# Patient Record
Sex: Male | Born: 2000 | Race: Black or African American | Hispanic: No | Marital: Single | Smoking: Never smoker
Health system: Southern US, Community
[De-identification: ages and names within clinical notes are randomized; demographics above are authoritative.]

## PROBLEM LIST (undated history)

## (undated) DIAGNOSIS — E109 Type 1 diabetes mellitus without complications: Secondary | ICD-10-CM

## (undated) DIAGNOSIS — E119 Type 2 diabetes mellitus without complications: Secondary | ICD-10-CM

## (undated) DIAGNOSIS — J45909 Unspecified asthma, uncomplicated: Secondary | ICD-10-CM

---
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
## 2021-02-03 ENCOUNTER — Emergency Department (HOSPITAL_COMMUNITY)
Admission: EM | Admit: 2021-02-03 | Discharge: 2021-02-04 | Disposition: A | Discharge status: Home / Self Care | Payer: Medicaid Other | Attending: Emergency Medicine | Admitting: Emergency Medicine

## 2021-02-03 ENCOUNTER — Other Ambulatory Visit: Payer: Self-pay

## 2021-02-03 ENCOUNTER — Emergency Department (HOSPITAL_COMMUNITY): Payer: Medicaid Other

## 2021-02-03 ENCOUNTER — Encounter (HOSPITAL_COMMUNITY): Payer: Self-pay | Admitting: *Deleted

## 2021-02-03 DIAGNOSIS — R519 Headache, unspecified: Secondary | ICD-10-CM | POA: Diagnosis not present

## 2021-02-03 DIAGNOSIS — R079 Chest pain, unspecified: Secondary | ICD-10-CM | POA: Insufficient documentation

## 2021-02-03 DIAGNOSIS — R0602 Shortness of breath: Secondary | ICD-10-CM | POA: Insufficient documentation

## 2021-02-03 DIAGNOSIS — R109 Unspecified abdominal pain: Secondary | ICD-10-CM | POA: Insufficient documentation

## 2021-02-03 DIAGNOSIS — Z5321 Procedure and treatment not carried out due to patient leaving prior to being seen by health care provider: Secondary | ICD-10-CM | POA: Diagnosis not present

## 2021-02-03 LAB — CBC WITH DIFFERENTIAL/PLATELET
Abs Immature Granulocytes: 0.03 10*3/uL (ref 0.00–0.07)
Basophils Absolute: 0 10*3/uL (ref 0.0–0.1)
Basophils Relative: 0 %
Eosinophils Absolute: 0.3 10*3/uL (ref 0.0–0.5)
Eosinophils Relative: 4 %
HCT: 46.5 % (ref 39.0–52.0)
Hemoglobin: 16 g/dL (ref 13.0–17.0)
Immature Granulocytes: 0 %
Lymphocytes Relative: 26 %
Lymphs Abs: 2.2 10*3/uL (ref 0.7–4.0)
MCH: 28.2 pg (ref 26.0–34.0)
MCHC: 34.4 g/dL (ref 30.0–36.0)
MCV: 81.9 fL (ref 80.0–100.0)
Monocytes Absolute: 0.5 10*3/uL (ref 0.1–1.0)
Monocytes Relative: 6 %
Neutro Abs: 5.1 10*3/uL (ref 1.7–7.7)
Neutrophils Relative %: 64 %
Platelets: 236 10*3/uL (ref 150–400)
RBC: 5.68 MIL/uL (ref 4.22–5.81)
RDW: 11.9 % (ref 11.5–15.5)
WBC: 8.2 10*3/uL (ref 4.0–10.5)
nRBC: 0 % (ref 0.0–0.2)

## 2021-02-03 LAB — URINALYSIS, ROUTINE W REFLEX MICROSCOPIC
Bilirubin Urine: NEGATIVE
Glucose, UA: NEGATIVE mg/dL
Hgb urine dipstick: NEGATIVE
Ketones, ur: NEGATIVE mg/dL
Leukocytes,Ua: NEGATIVE
Nitrite: NEGATIVE
Protein, ur: NEGATIVE mg/dL
Specific Gravity, Urine: 1.018 (ref 1.005–1.030)
pH: 6 (ref 5.0–8.0)

## 2021-02-03 LAB — COMPREHENSIVE METABOLIC PANEL
ALT: 41 U/L (ref 0–44)
AST: 26 U/L (ref 15–41)
Albumin: 3.7 g/dL (ref 3.5–5.0)
Alkaline Phosphatase: 51 U/L (ref 38–126)
Anion gap: 9 (ref 5–15)
BUN: <5 mg/dL — ABNORMAL LOW (ref 6–20)
CO2: 29 mmol/L (ref 22–32)
Calcium: 9.6 mg/dL (ref 8.9–10.3)
Chloride: 99 mmol/L (ref 98–111)
Creatinine, Ser: 1.19 mg/dL (ref 0.61–1.24)
GFR, Estimated: >60 mL/min (ref >60)
Glucose, Bld: 212 mg/dL — ABNORMAL HIGH (ref 70–99)
Potassium: 4 mmol/L (ref 3.5–5.1)
Sodium: 137 mmol/L (ref 135–145)
Total Bilirubin: 0.7 mg/dL (ref 0.3–1.2)
Total Protein: 7 g/dL (ref 6.5–8.1)

## 2021-02-03 LAB — CBG MONITORING, ED: Glucose-Capillary: 205 mg/dL — ABNORMAL HIGH (ref 70–99)

## 2021-02-03 LAB — TROPONIN I (HIGH SENSITIVITY)
Troponin I (High Sensitivity): 6 ng/L (ref <18)
Troponin I (High Sensitivity): 6 ng/L (ref <18)

## 2021-02-03 LAB — LIPASE, BLOOD: Lipase: 34 U/L (ref 11–51)

## 2021-02-03 NOTE — ED Provider Notes (Signed)
Emergency Medicine Provider Triage Evaluation Note  Bruce Walters , a 20 y.o. male  was evaluated in triage.  Pt complains of chest pain and hyperglycemia. He states that he is only taking metformin which is his only prescribed medicine for this.  He states that he has been having intermittent episodes where sugars are in the 60s.  He states that he overall has poor appetite.  He denies any fevers.  He does report pain in his chest which started about a week ago.  No fevers..  Review of Systems  Positive: Chest pain, abdominal pain.  Negative: fevers  Physical Exam  BP 132/79 (BP Location: Right Arm)   Pulse 97   Temp 98.7 F (37.1 C) (Oral)   Resp 16   Ht 6\' 5"  (1.956 m)   Wt (!) 138.3 kg   SpO2 97%   BMI 36.16 kg/m  Gen:   Awake, no distress   Resp:  Normal effort  MSK:   Moves extremities without difficulty  Other:  Patient is awake and alert.  Speech is not slurred.  Answers questions appropriately without difficulty.  Medical Decision Making  Medically screening exam initiated at 6:00 PM.  Appropriate orders placed.  Jaicob Dia was informed that the remainder of the evaluation will be completed by another provider, this initial triage assessment does not replace that evaluation, and the importance of remaining in the ED until their evaluation is complete.     Irving Copas, PA-C 02/03/21 1802    02/05/21, MD 02/03/21 [phone]

## 2021-02-03 NOTE — ED Notes (Signed)
Pt did not want to wait and left. 

## 2021-02-03 NOTE — ED Triage Notes (Signed)
The pt was diagnosed with diabetes one month ago  he does not have a regular doctor.  He is having chest and abd pain with some sob with a headache

## 2021-02-05 ENCOUNTER — Emergency Department (HOSPITAL_COMMUNITY): Payer: Medicaid Other

## 2021-02-05 ENCOUNTER — Emergency Department (HOSPITAL_COMMUNITY)
Admission: EM | Admit: 2021-02-05 | Discharge: 2021-02-06 | Disposition: A | Discharge status: Home / Self Care | Payer: Medicaid Other | Attending: Emergency Medicine | Admitting: Emergency Medicine

## 2021-02-05 ENCOUNTER — Other Ambulatory Visit: Payer: Self-pay

## 2021-02-05 ENCOUNTER — Encounter (HOSPITAL_COMMUNITY): Payer: Self-pay | Admitting: Emergency Medicine

## 2021-02-05 DIAGNOSIS — R079 Chest pain, unspecified: Secondary | ICD-10-CM | POA: Insufficient documentation

## 2021-02-05 DIAGNOSIS — R0602 Shortness of breath: Secondary | ICD-10-CM | POA: Insufficient documentation

## 2021-02-05 DIAGNOSIS — R112 Nausea with vomiting, unspecified: Secondary | ICD-10-CM | POA: Insufficient documentation

## 2021-02-05 DIAGNOSIS — Z5321 Procedure and treatment not carried out due to patient leaving prior to being seen by health care provider: Secondary | ICD-10-CM | POA: Insufficient documentation

## 2021-02-05 HISTORY — DX: Unspecified asthma, uncomplicated: J45.909

## 2021-02-05 LAB — COMPREHENSIVE METABOLIC PANEL
ALT: 39 U/L (ref 0–44)
AST: 26 U/L (ref 15–41)
Albumin: 3.9 g/dL (ref 3.5–5.0)
Alkaline Phosphatase: 50 U/L (ref 38–126)
Anion gap: 11 (ref 5–15)
BUN: 5 mg/dL — ABNORMAL LOW (ref 6–20)
CO2: 26 mmol/L (ref 22–32)
Calcium: 9.6 mg/dL (ref 8.9–10.3)
Chloride: 99 mmol/L (ref 98–111)
Creatinine, Ser: 1.17 mg/dL (ref 0.61–1.24)
GFR, Estimated: >60 mL/min (ref >60)
Glucose, Bld: 230 mg/dL — ABNORMAL HIGH (ref 70–99)
Potassium: 3.9 mmol/L (ref 3.5–5.1)
Sodium: 136 mmol/L (ref 135–145)
Total Bilirubin: 0.7 mg/dL (ref 0.3–1.2)
Total Protein: 7.2 g/dL (ref 6.5–8.1)

## 2021-02-05 LAB — CBC
HCT: 46.6 % (ref 39.0–52.0)
Hemoglobin: 16 g/dL (ref 13.0–17.0)
MCH: 27.9 pg (ref 26.0–34.0)
MCHC: 34.3 g/dL (ref 30.0–36.0)
MCV: 81.3 fL (ref 80.0–100.0)
Platelets: 222 10*3/uL (ref 150–400)
RBC: 5.73 MIL/uL (ref 4.22–5.81)
RDW: 12.1 % (ref 11.5–15.5)
WBC: 8.1 10*3/uL (ref 4.0–10.5)
nRBC: 0 % (ref 0.0–0.2)

## 2021-02-05 LAB — TROPONIN I (HIGH SENSITIVITY): Troponin I (High Sensitivity): 5 ng/L (ref <18)

## 2021-02-05 LAB — LIPASE, BLOOD: Lipase: 36 U/L (ref 11–51)

## 2021-02-05 NOTE — ED Triage Notes (Signed)
Pt here for cp and sob that started about a week ago. Centralized cp, non radiating, along w/ N/V, sob.

## 2021-02-06 NOTE — ED Notes (Signed)
Pt leaving AMA, advised to return if symptoms worsen. 

## 2021-02-07 ENCOUNTER — Emergency Department (HOSPITAL_COMMUNITY): Payer: Medicaid Other

## 2021-02-07 ENCOUNTER — Inpatient Hospital Stay (HOSPITAL_COMMUNITY)
Admission: EM | Admit: 2021-02-07 | Discharge: 2021-02-10 | DRG: 176 | Disposition: A | Discharge status: Home / Self Care | Payer: Medicaid Other | Attending: Hospitalist | Admitting: Hospitalist

## 2021-02-07 ENCOUNTER — Other Ambulatory Visit: Payer: Self-pay

## 2021-02-07 DIAGNOSIS — I2694 Multiple subsegmental pulmonary emboli without acute cor pulmonale: Secondary | ICD-10-CM

## 2021-02-07 DIAGNOSIS — Z20822 Contact with and (suspected) exposure to covid-19: Secondary | ICD-10-CM | POA: Diagnosis present

## 2021-02-07 DIAGNOSIS — E119 Type 2 diabetes mellitus without complications: Secondary | ICD-10-CM

## 2021-02-07 DIAGNOSIS — Z7984 Long term (current) use of oral hypoglycemic drugs: Secondary | ICD-10-CM

## 2021-02-07 DIAGNOSIS — E1165 Type 2 diabetes mellitus with hyperglycemia: Secondary | ICD-10-CM | POA: Diagnosis present

## 2021-02-07 DIAGNOSIS — J4530 Mild persistent asthma, uncomplicated: Secondary | ICD-10-CM | POA: Diagnosis present

## 2021-02-07 DIAGNOSIS — R911 Solitary pulmonary nodule: Secondary | ICD-10-CM | POA: Diagnosis present

## 2021-02-07 DIAGNOSIS — E66812 Obesity, class 2: Secondary | ICD-10-CM

## 2021-02-07 DIAGNOSIS — Z6836 Body mass index (BMI) 36.0-36.9, adult: Secondary | ICD-10-CM

## 2021-02-07 DIAGNOSIS — I2699 Other pulmonary embolism without acute cor pulmonale: Principal | ICD-10-CM | POA: Diagnosis present

## 2021-02-07 HISTORY — DX: Body mass index (BMI) 36.0-36.9, adult: E66.01

## 2021-02-07 LAB — BASIC METABOLIC PANEL
Anion gap: 11 (ref 5–15)
BUN: 6 mg/dL (ref 6–20)
CO2: 24 mmol/L (ref 22–32)
Calcium: 9.5 mg/dL (ref 8.9–10.3)
Chloride: 100 mmol/L (ref 98–111)
Creatinine, Ser: 1.28 mg/dL — ABNORMAL HIGH (ref 0.61–1.24)
GFR, Estimated: >60 mL/min (ref >60)
Glucose, Bld: 243 mg/dL — ABNORMAL HIGH (ref 70–99)
Potassium: 3.8 mmol/L (ref 3.5–5.1)
Sodium: 135 mmol/L (ref 135–145)

## 2021-02-07 LAB — CBC WITH DIFFERENTIAL/PLATELET
Abs Immature Granulocytes: 0.03 10*3/uL (ref 0.00–0.07)
Basophils Absolute: 0.1 10*3/uL (ref 0.0–0.1)
Basophils Relative: 1 %
Eosinophils Absolute: 0.4 10*3/uL (ref 0.0–0.5)
Eosinophils Relative: 5 %
HCT: 47.7 % (ref 39.0–52.0)
Hemoglobin: 16.1 g/dL (ref 13.0–17.0)
Immature Granulocytes: 0 %
Lymphocytes Relative: 25 %
Lymphs Abs: 2.3 10*3/uL (ref 0.7–4.0)
MCH: 27.8 pg (ref 26.0–34.0)
MCHC: 33.8 g/dL (ref 30.0–36.0)
MCV: 82.2 fL (ref 80.0–100.0)
Monocytes Absolute: 0.6 10*3/uL (ref 0.1–1.0)
Monocytes Relative: 6 %
Neutro Abs: 5.8 10*3/uL (ref 1.7–7.7)
Neutrophils Relative %: 63 %
Platelets: 200 10*3/uL (ref 150–400)
RBC: 5.8 MIL/uL (ref 4.22–5.81)
RDW: 12 % (ref 11.5–15.5)
WBC: 9.1 10*3/uL (ref 4.0–10.5)
nRBC: 0 % (ref 0.0–0.2)

## 2021-02-07 LAB — TROPONIN I (HIGH SENSITIVITY): Troponin I (High Sensitivity): 32 ng/L — ABNORMAL HIGH (ref <18)

## 2021-02-07 LAB — MAGNESIUM: Magnesium: 1.7 mg/dL (ref 1.7–2.4)

## 2021-02-07 NOTE — ED Triage Notes (Signed)
Pt reports cp and shob x 1 week. Worse with exertion. Also reports abdominal pain with n/v but no diarrhea. Seen for same two days ago but LWBS d/t wait.

## 2021-02-07 NOTE — ED Provider Notes (Signed)
Emergency Medicine Provider Triage Evaluation Note  Bruce Walters , a 20 y.o. male  was evaluated in triage.  Pt complains of chest pain with associated shortness of breath and palpitations x1 week.  Happens at rest but also exacerbated with exertion.  Does not radiate to the neck or the arms.  No history of the same.  Recently diagnosed with diabetes but does not have a primary care doctor.  He is on glipizide and metformin.  Endorses nausea, vomiting with single episode of NBNB emesis daily for the last week, no diarrhea no fevers no chills.  Review of Systems  Positive: CP, SOB, palpitations, N/V Negative: Fevers, chills, diarrhea  Physical Exam  BP 136/81 (BP Location: Right Arm)   Pulse (!) 112   Temp 98.2 F (36.8 C) (Oral)   Resp 18   SpO2 97%  Gen:   Awake, no distress   Resp:  Normal effort  MSK:   Moves extremities without difficulty  Other:  Tachycardic with RRhythm no M/R/G.  Lungs CTA B.  Medical Decision Making  Medically screening exam initiated at 7:11 PM.  Appropriate orders placed.  Bruce Walters was informed that the remainder of the evaluation will be completed by another provider, this initial triage assessment does not replace that evaluation, and the importance of remaining in the ED until their evaluation is complete.  This chart was dictated using voice recognition software, Dragon. Despite the best efforts of this provider to proofread and correct errors, errors may still occur which can change documentation meaning.    Sherrilee Gilles 02/07/21 1913    Pollyann Savoy, MD 02/07/21 [phone]

## 2021-02-08 ENCOUNTER — Observation Stay (HOSPITAL_BASED_OUTPATIENT_CLINIC_OR_DEPARTMENT_OTHER): Payer: Medicaid Other

## 2021-02-08 ENCOUNTER — Emergency Department (HOSPITAL_COMMUNITY): Payer: Medicaid Other

## 2021-02-08 ENCOUNTER — Encounter: Payer: Self-pay | Admitting: Internal Medicine

## 2021-02-08 ENCOUNTER — Encounter (HOSPITAL_COMMUNITY): Payer: Self-pay | Admitting: Internal Medicine

## 2021-02-08 DIAGNOSIS — I2699 Other pulmonary embolism without acute cor pulmonale: Secondary | ICD-10-CM | POA: Diagnosis not present

## 2021-02-08 DIAGNOSIS — I2602 Saddle embolus of pulmonary artery with acute cor pulmonale: Secondary | ICD-10-CM

## 2021-02-08 DIAGNOSIS — E119 Type 2 diabetes mellitus without complications: Secondary | ICD-10-CM | POA: Insufficient documentation

## 2021-02-08 LAB — HEPARIN LEVEL (UNFRACTIONATED)
Heparin Unfractionated: 0.27 IU/mL — ABNORMAL LOW (ref 0.30–0.70)
Heparin Unfractionated: 0.36 IU/mL (ref 0.30–0.70)

## 2021-02-08 LAB — CBC
HCT: 45 % (ref 39.0–52.0)
Hemoglobin: 15.3 g/dL (ref 13.0–17.0)
MCH: 27.9 pg (ref 26.0–34.0)
MCHC: 34 g/dL (ref 30.0–36.0)
MCV: 82.1 fL (ref 80.0–100.0)
Platelets: 185 10*3/uL (ref 150–400)
RBC: 5.48 MIL/uL (ref 4.22–5.81)
RDW: 12.1 % (ref 11.5–15.5)
WBC: 11.2 10*3/uL — ABNORMAL HIGH (ref 4.0–10.5)
nRBC: 0 % (ref 0.0–0.2)

## 2021-02-08 LAB — ECHOCARDIOGRAM COMPLETE
Area-P 1/2: 3.88 cm2
S' Lateral: 3 cm

## 2021-02-08 LAB — RESP PANEL BY RT-PCR (FLU A&B, COVID) ARPGX2
Influenza A by PCR: NEGATIVE
Influenza B by PCR: NEGATIVE
SARS Coronavirus 2 by RT PCR: NEGATIVE

## 2021-02-08 LAB — GLUCOSE, CAPILLARY
Glucose-Capillary: 193 mg/dL — ABNORMAL HIGH (ref 70–99)
Glucose-Capillary: 133 mg/dL — ABNORMAL HIGH (ref 70–99)
Glucose-Capillary: 138 mg/dL — ABNORMAL HIGH (ref 70–99)

## 2021-02-08 LAB — RAPID URINE DRUG SCREEN, HOSP PERFORMED
Amphetamines: NOT DETECTED
Barbiturates: NOT DETECTED
Benzodiazepines: NOT DETECTED
Cocaine: NOT DETECTED
Opiates: NOT DETECTED
Tetrahydrocannabinol: NOT DETECTED

## 2021-02-08 LAB — BRAIN NATRIURETIC PEPTIDE: B Natriuretic Peptide: 18 pg/mL (ref 0.0–100.0)

## 2021-02-08 LAB — BASIC METABOLIC PANEL
Anion gap: 9 (ref 5–15)
BUN: 5 mg/dL — ABNORMAL LOW (ref 6–20)
CO2: 27 mmol/L (ref 22–32)
Calcium: 9.2 mg/dL (ref 8.9–10.3)
Chloride: 101 mmol/L (ref 98–111)
Creatinine, Ser: 1.17 mg/dL (ref 0.61–1.24)
GFR, Estimated: >60 mL/min (ref >60)
Glucose, Bld: 150 mg/dL — ABNORMAL HIGH (ref 70–99)
Potassium: 4 mmol/L (ref 3.5–5.1)
Sodium: 137 mmol/L (ref 135–145)

## 2021-02-08 LAB — TROPONIN I (HIGH SENSITIVITY): Troponin I (High Sensitivity): 30 ng/L — ABNORMAL HIGH (ref <18)

## 2021-02-08 LAB — HEMOGLOBIN A1C
Hgb A1c MFr Bld: 10.6 % — ABNORMAL HIGH (ref 4.8–5.6)
Mean Plasma Glucose: 257.52 mg/dL

## 2021-02-08 LAB — HIV ANTIBODY (ROUTINE TESTING W REFLEX): HIV Screen 4th Generation wRfx: NONREACTIVE

## 2021-02-08 LAB — PHOSPHORUS: Phosphorus: 4.1 mg/dL (ref 2.5–4.6)

## 2021-02-08 LAB — MAGNESIUM: Magnesium: 1.6 mg/dL — ABNORMAL LOW (ref 1.7–2.4)

## 2021-02-08 LAB — CBG MONITORING, ED: Glucose-Capillary: 144 mg/dL — ABNORMAL HIGH (ref 70–99)

## 2021-02-08 MED ORDER — ONDANSETRON HCL 4 MG PO TABS: 4.0000 mg | ORAL_TABLET | Freq: Four times a day (QID) | ORAL | Status: DC | PRN

## 2021-02-08 MED ORDER — INSULIN ASPART 100 UNIT/ML IJ SOLN: 0.0000 [IU] | Freq: Every day | INTRAMUSCULAR | Status: DC

## 2021-02-08 MED ORDER — ACETAMINOPHEN 325 MG PO TABS: 650.0000 mg | ORAL_TABLET | Freq: Four times a day (QID) | ORAL | Status: DC | PRN

## 2021-02-08 MED ORDER — HEPARIN (PORCINE) 25000 UT/250ML-% IV SOLN
2300.0000 [IU]/h | INTRAVENOUS | Status: DC
  Administered 2021-02-08: 1800 [IU]/h via INTRAVENOUS
  Administered 2021-02-09: 2000 [IU]/h via INTRAVENOUS
  Filled 2021-02-08 (×3): qty 250

## 2021-02-08 MED ORDER — ALBUTEROL SULFATE HFA 108 (90 BASE) MCG/ACT IN AERS
2.0000 | INHALATION_SPRAY | Freq: Once | RESPIRATORY_TRACT | Status: AC
  Administered 2021-02-08: 2 via RESPIRATORY_TRACT
  Filled 2021-02-08: qty 6.7

## 2021-02-08 MED ORDER — ONDANSETRON 4 MG PO TBDP
4.0000 mg | ORAL_TABLET | Freq: Once | ORAL | Status: AC
  Administered 2021-02-08: 4 mg via ORAL
  Filled 2021-02-08: qty 1

## 2021-02-08 MED ORDER — BISACODYL 5 MG PO TBEC: 5.0000 mg | DELAYED_RELEASE_TABLET | Freq: Every day | ORAL | Status: DC | PRN

## 2021-02-08 MED ORDER — ONDANSETRON HCL 4 MG/2ML IJ SOLN: 4.0000 mg | Freq: Four times a day (QID) | INTRAMUSCULAR | Status: DC | PRN

## 2021-02-08 MED ORDER — OXYCODONE HCL 5 MG PO TABS: 5.0000 mg | ORAL_TABLET | Freq: Four times a day (QID) | ORAL | Status: DC | PRN

## 2021-02-08 MED ORDER — HYDROCODONE-ACETAMINOPHEN 5-325 MG PO TABS
1.0000 | ORAL_TABLET | ORAL | Status: DC | PRN
  Administered 2021-02-09: 2 via ORAL
  Filled 2021-02-08: qty 2

## 2021-02-08 MED ORDER — MAGNESIUM OXIDE -MG SUPPLEMENT 400 (240 MG) MG PO TABS
800.0000 mg | ORAL_TABLET | Freq: Once | ORAL | Status: AC
  Administered 2021-02-08: 800 mg via ORAL
  Filled 2021-02-08: qty 2

## 2021-02-08 MED ORDER — HEPARIN BOLUS VIA INFUSION
7000.0000 [IU] | Freq: Once | INTRAVENOUS | Status: AC
  Administered 2021-02-08: 7000 [IU] via INTRAVENOUS
  Filled 2021-02-08: qty 7000

## 2021-02-08 MED ORDER — ACETAMINOPHEN 325 MG PO TABS
650.0000 mg | ORAL_TABLET | Freq: Four times a day (QID) | ORAL | Status: DC | PRN
  Administered 2021-02-09: 650 mg via ORAL
  Filled 2021-02-08: qty 2

## 2021-02-08 MED ORDER — INSULIN ASPART 100 UNIT/ML IJ SOLN
0.0000 [IU] | Freq: Three times a day (TID) | INTRAMUSCULAR | Status: DC
  Administered 2021-02-08: 3 [IU] via SUBCUTANEOUS
  Administered 2021-02-08: 2 [IU] via SUBCUTANEOUS
  Administered 2021-02-09: 3 [IU] via SUBCUTANEOUS
  Administered 2021-02-09 (×2): 2 [IU] via SUBCUTANEOUS
  Administered 2021-02-10 (×2): 3 [IU] via SUBCUTANEOUS

## 2021-02-08 MED ORDER — LACTATED RINGERS IV SOLN: INTRAVENOUS | Status: DC

## 2021-02-08 MED ORDER — INSULIN ASPART 100 UNIT/ML IJ SOLN
0.0000 [IU] | Freq: Three times a day (TID) | INTRAMUSCULAR | Status: DC
  Administered 2021-02-08: 3 [IU] via SUBCUTANEOUS

## 2021-02-08 MED ORDER — IOHEXOL 350 MG/ML SOLN
75.0000 mL | Freq: Once | INTRAVENOUS | Status: AC | PRN
  Administered 2021-02-08: 75 mL via INTRAVENOUS

## 2021-02-08 MED ORDER — SODIUM CHLORIDE 0.9% FLUSH
3.0000 mL | Freq: Two times a day (BID) | INTRAVENOUS | Status: DC
  Administered 2021-02-08 – 2021-02-10 (×5): 3 mL via INTRAVENOUS

## 2021-02-08 MED ORDER — LIVING WELL WITH DIABETES BOOK
Freq: Once | Status: AC
  Filled 2021-02-08: qty 1

## 2021-02-08 MED ORDER — ACETAMINOPHEN 650 MG RE SUPP: 650.0000 mg | Freq: Four times a day (QID) | RECTAL | Status: DC | PRN

## 2021-02-08 MED ORDER — POLYETHYLENE GLYCOL 3350 17 G PO PACK: 17.0000 g | PACK | Freq: Every day | ORAL | Status: DC | PRN

## 2021-02-08 MED ORDER — MORPHINE SULFATE (PF) 2 MG/ML IV SOLN: 2.0000 mg | INTRAVENOUS | Status: DC | PRN

## 2021-02-08 NOTE — Progress Notes (Addendum)
Inpatient Diabetes Program Recommendations  AACE/ADA: New Consensus Statement on Inpatient Glycemic Control (2015)  Target Ranges:  Prepandial:   less than 140 mg/dL      Peak postprandial:   less than 180 mg/dL (1-2 hours)      Critically ill patients:  140 - 180 mg/dL   Lab Results  Component Value Date   GLUCAP 193 (H) 02/08/2021   HGBA1C 10.6 (H) 02/08/2021    Review of Glycemic Control  Diabetes history: New diagnosis in 09/2020 DM type 2 Outpatient Diabetes medications: Metformin 1000 mg bid, Glipizide 5 mg bid Current orders for Inpatient glycemic control:  Novolog 0-15 units tid + hs  Inpatient Diabetes Program Recommendations:    - Consider adding Glipizide 5 mg bid to see how it controls glucose levels at this time. May can avoid insulin based on current trends.  Consult regarding DM education/self care and fairly new diagnosis this year.  Spoke with pt and mom at bedside regarding new diabetes diagnosis in February. Walked through self care topics of checking glucose (fasting and alternating second check), taking meds (twice a day with food), exercise (30 minutes 5 days a week), Dietary changes (portion sizes, limiting carbohydrates, how often to eat certain foods), and A1c level. Pt and mom have no further questions at this time.   Note pt would sometimes eat one meal a day and would have occasional hypoglycemia at 60 at home on oral meds. SW  Thanks,  Christena Deem RN, MSN, BC-ADM Inpatient Diabetes Coordinator Team Pager [phone] (8a-5p)

## 2021-02-08 NOTE — ED Provider Notes (Signed)
[name] EMERGENCY DEPARTMENT Provider Note   CSN: [id number] Arrival date & time: 02/07/21  1801     History Chief Complaint  Patient presents with   Chest Pain   Abdominal Pain   Shortness of Breath    Bruce Walters is a 20 y.o. male.  Patient with history of DM presents with c/o chest pain, SOB, cough for the past one week. No fever. Symptoms are constant but made worse when active. He has had nausea with vomiting, reporting 1-2 episodes vomiting per day since symptoms began. He reports taking his medications as prescribed and controlled blood sugars. No sick contacts. No diarrhea. He reports driving to [place] within the last 2 weeks. No leg swelling or pain at any time.    The history is provided by the patient. No language interpreter was used.  Chest Pain Associated symptoms: abdominal pain, cough, shortness of breath and vomiting   Associated symptoms: no fever and no weakness   Abdominal Pain Associated symptoms: chest pain, cough, shortness of breath and vomiting   Associated symptoms: no chills, no diarrhea and no fever   Shortness of Breath Associated symptoms: abdominal pain, chest pain, cough and vomiting   Associated symptoms: no fever and no rash       Past Medical History:  Diagnosis Date   Asthma    Diabetes mellitus without complication (HCC)     There are no problems to display for this patient.   No past surgical history on file.     No family history on file.  Social History   Tobacco Use   Smoking status: Never   Smokeless tobacco: Never  Substance Use Topics   Alcohol use: Never   Drug use: Never    Home Medications Prior to Admission medications   Medication Sig Start Date End Date Taking? Authorizing Provider  glipiZIDE (GLUCOTROL) 5 MG tablet Take 1 tablet (5 mg total) by mouth 2 (two) times daily before a meal. 09/29/20   Wallis Bamberg, PA-C  metFORMIN (GLUCOPHAGE) 1000 MG tablet Take 1 tablet (1,000 mg total)  by mouth 2 (two) times daily with a meal. 09/29/20   Wallis Bamberg, PA-C  ondansetron (ZOFRAN-ODT) 8 MG disintegrating tablet Take 1 tablet (8 mg total) by mouth every 8 (eight) hours as needed for nausea or vomiting. 09/29/20   Wallis Bamberg, PA-C    Allergies    Patient has no known allergies.  Review of Systems   Review of Systems  Constitutional:  Negative for chills and fever.  HENT: Negative.    Respiratory:  Positive for cough and shortness of breath.   Cardiovascular:  Positive for chest pain.  Gastrointestinal:  Positive for abdominal pain and vomiting. Negative for diarrhea.  Genitourinary: Negative.   Musculoskeletal:  Negative for myalgias.  Skin:  Negative for rash.  Neurological:  Negative for weakness and light-headedness.   Physical Exam Updated Vital Signs BP 124/71 (BP Location: Left Arm)   Pulse 79   Temp 98.8 F (37.1 C) (Oral)   Resp 15   SpO2 97%   Physical Exam Vitals and nursing note reviewed.  Constitutional:      Appearance: He is well-developed.  HENT:     Head: Normocephalic.  Cardiovascular:     Rate and Rhythm: Normal rate and regular rhythm.     Heart sounds: No murmur heard. Pulmonary:     Effort: Pulmonary effort is normal.     Breath sounds: Normal breath sounds. No wheezing, rhonchi or  rales.     Comments: Dry cough with inspiration Abdominal:     General: Bowel sounds are normal.     Palpations: Abdomen is soft.     Tenderness: There is no abdominal tenderness. There is no guarding or rebound.  Musculoskeletal:        General: Normal range of motion.     Cervical back: Normal range of motion and neck supple.     Right lower leg: No tenderness. No edema.     Left lower leg: No tenderness. No edema.  Skin:    General: Skin is warm and dry.  Neurological:     General: No focal deficit present.     Mental Status: He is alert and oriented to person, place, and time.    ED Results / Procedures / Treatments   Labs (all labs ordered are  listed, but only abnormal results are displayed) Labs Reviewed  BASIC METABOLIC PANEL - Abnormal; Notable for the following components:      Result Value   Glucose, Bld 243 (*)    Creatinine, Ser 1.28 (*)    All other components within normal limits  TROPONIN I (HIGH SENSITIVITY) - Abnormal; Notable for the following components:   Troponin I (High Sensitivity) 30 (*)    All other components within normal limits  TROPONIN I (HIGH SENSITIVITY) - Abnormal; Notable for the following components:   Troponin I (High Sensitivity) 32 (*)    All other components within normal limits  RESP PANEL BY RT-PCR (FLU A&B, COVID) ARPGX2  CBC WITH DIFFERENTIAL/PLATELET  MAGNESIUM   Results for orders placed or performed during the hospital encounter of 02/07/21  Basic metabolic panel  Result Value Ref Range   Sodium 135 135 - 145 mmol/L   Potassium 3.8 3.5 - 5.1 mmol/L   Chloride 100 98 - 111 mmol/L   CO2 24 22 - 32 mmol/L   Glucose, Bld 243 (H) 70 - 99 mg/dL   BUN 6 6 - 20 mg/dL   Creatinine, Ser 7.00 (H) 0.61 - 1.24 mg/dL   Calcium 9.5 8.9 - 17.4 mg/dL   GFR, Estimated >94 >49 mL/min   Anion gap 11 5 - 15  CBC with Differential  Result Value Ref Range   WBC 9.1 4.0 - 10.5 K/uL   RBC 5.80 4.22 - 5.81 MIL/uL   Hemoglobin 16.1 13.0 - 17.0 g/dL   HCT 67.5 91.6 - 38.4 %   MCV 82.2 80.0 - 100.0 fL   MCH 27.8 26.0 - 34.0 pg   MCHC 33.8 30.0 - 36.0 g/dL   RDW 66.5 99.3 - 57.0 %   Platelets 200 150 - 400 K/uL   nRBC 0.0 0.0 - 0.2 %   Neutrophils Relative % 63 %   Neutro Abs 5.8 1.7 - 7.7 K/uL   Lymphocytes Relative 25 %   Lymphs Abs 2.3 0.7 - 4.0 K/uL   Monocytes Relative 6 %   Monocytes Absolute 0.6 0.1 - 1.0 K/uL   Eosinophils Relative 5 %   Eosinophils Absolute 0.4 0.0 - 0.5 K/uL   Basophils Relative 1 %   Basophils Absolute 0.1 0.0 - 0.1 K/uL   Immature Granulocytes 0 %   Abs Immature Granulocytes 0.03 0.00 - 0.07 K/uL  Magnesium  Result Value Ref Range   Magnesium 1.7 1.7 - 2.4  mg/dL  Troponin I (High Sensitivity)  Result Value Ref Range   Troponin I (High Sensitivity) 30 (H) <18 ng/L  Troponin I (High Sensitivity)  Result  Value Ref Range   Troponin I (High Sensitivity) 32 (H) <18 ng/L    EKG None EKG: normal sinus rhythm, sinus tachycardia.  Radiology DG Chest 2 View  Result Date: 02/07/2021 CLINICAL DATA:  Shortness of breath and chest pain. EXAM: CHEST - 2 VIEW COMPARISON:  02/05/2021 FINDINGS: Low volume film without focal airspace consolidation or pleural effusion. No pneumothorax or evidence of pulmonary edema. The cardiopericardial silhouette is within normal limits for size. The visualized bony structures of the thorax show no acute abnormality. IMPRESSION: Low volume film without acute cardiopulmonary findings. Electronically Signed   By: Kennith Center M.D.   On: 02/07/2021 20:29    Procedures Procedures   Medications Ordered in ED Medications  albuterol (VENTOLIN HFA) 108 (90 Base) MCG/ACT inhaler 2 puff (2 puffs Inhalation Given 02/08/21 0350)  ondansetron (ZOFRAN-ODT) disintegrating tablet 4 mg (4 mg Oral Given 02/08/21 0505)  iohexol (OMNIPAQUE) 350 MG/ML injection 75 mL (75 mLs Intravenous Contrast Given 02/08/21 0509)    ED Course  I have reviewed the triage vital signs and the nursing notes.  Pertinent labs & imaging results that were available during my care of the patient were reviewed by me and considered in my medical decision making (see chart for details).    MDM Rules/Calculators/A&P                          Patient to ED with c/o chest pain, SOB/DOE, cough. No fever, chills. Vomiting x 1 daily. Symptoms x 1 week.   The patient is well appearing. He is coughing with any inspiration but lung fields clear. EKG sinus tachycardia at rate of 113. CXR essentially negative.   Troponin elevated x 2 - 30, 32. Hyperglycemia to 243. CTA chest r/o PE ordered. RVP pending, although he reports negative testing this past week. No history of  clots. No FH heart disease.   COVID/flu negative.  CTA chest shows bilateral central PE. There is a RUL ground glass opacity, ?infection, doubt infarction. Heparin started per pharmacy protocol. Patient updated on results and plan for admission. No unanswered questions. Hospitalist paged.    Final Clinical Impression(s) / ED Diagnoses Final diagnoses:  None   Bilateral PE   Rx / DC Orders ED Discharge Orders     None        Elpidio Anis, PA-C 02/08/21 [id number]    Gilda Crease, MD 02/08/21 [phone]

## 2021-02-08 NOTE — Progress Notes (Signed)
ANTICOAGULATION CONSULT NOTE - Follow Up Consult  Pharmacy Consult for Heparin Indication: pulmonary embolus  No Known Allergies  Patient Measurements: Height: 6\' 5"  (195.6 cm) IBW/kg (Calculated) : 89.1 Heparin Dosing Weight: 119.4 kg  Vital Signs: Temp: 98 F (36.7 C) (06/22 1128) Temp Source: Oral (06/22 1128) BP: 103/82 (06/22 1128) Pulse Rate: 97 (06/22 1128)  Labs: Recent Labs    02/05/21 1932 02/07/21 1912 02/07/21 2241 02/08/21 0640 02/08/21 1325  HGB 16.0 16.1  --  15.3  --   HCT 46.6 47.7  --  45.0  --   PLT 222 200  --  185  --   HEPARINUNFRC  --   --   --   --  0.27*  CREATININE 1.17 1.28*  --  1.17  --   TROPONINIHS 5 30* 32*  --   --     Estimated Creatinine Clearance: 157.4 mL/min (by C-G formula based on SCr of 1.17 mg/dL).    Assessment: Anticoag: hep gtt for PE, no right heart strain. Hep level 0.27. Hgb 15.3, Plts 185 - 6/22: Dopplers negative  Goal of Therapy:  Heparin level 0.3-0.7 units/ml Monitor platelets by anticoagulation protocol: Yes   Plan:  Increase IV heparin to 2000 units/hr Recheck heparin level in 6 hrs. DAily HL and CBC   Rebakah Cokley S. 7/22, PharmD, BCPS Clinical Staff Pharmacist [email or web address]  Merilynn Finland, Berthold Glace Stillinger 02/08/2021,2:46 PM

## 2021-02-08 NOTE — Progress Notes (Signed)
  Echocardiogram 2D Echocardiogram has been performed.  Bruce Walters 02/08/2021, 10:33 AM

## 2021-02-08 NOTE — Progress Notes (Signed)
ANTICOAGULATION CONSULT NOTE - Initial Consult  Pharmacy Consult for Heparin  Indication: pulmonary embolus  No Known Allergies   Heparin Dosing Weight: ~120 kg  Vital Signs: Temp: 98.8 F (37.1 C) (06/22 0125) Temp Source: Oral (06/22 0125) BP: 136/88 (06/22 0600) Pulse Rate: 83 (06/22 0600)  Labs: Recent Labs    02/05/21 1932 02/07/21 1912 02/07/21 2241  HGB 16.0 16.1  --   HCT 46.6 47.7  --   PLT 222 200  --   CREATININE 1.17 1.28*  --   TROPONINIHS 5 30* 32*    Estimated Creatinine Clearance: 143.9 mL/min (A) (by C-G formula based on SCr of 1.28 mg/dL (H)).   Medical History: Past Medical History:  Diagnosis Date   Asthma    Diabetes mellitus without complication (HCC)     Assessment: Bruce Walters is a 20 y.o. male with new onset PE. Starting heparin. CBC/renal function ok. PTA meds reviewed.   Goal of Therapy:  Heparin level 0.3-0.7 units/ml Monitor platelets by anticoagulation protocol: Yes   Plan:  Heparin 7000 units BOLUS Start heparin drip at 1800 units/hr 1300 Heparin level Daily CBC/Heparin level Monitor for bleeding  Bruce Walters, PharmD, BCPS Clinical Pharmacist Phone: [phone]

## 2021-02-08 NOTE — H&P (Addendum)
History and Physical    Bruce Walters ZOX:[id number] DOB: December 13, 2000 DOA: 02/07/2021  PCP: Patient, No Pcp Per (Inactive) - getting DM meds through Urgent Care Consultants:  None Patient coming from:  Home - lives with mother, siblings; NOK: Mother, [phone]  Chief Complaint: CP/SOB  HPI: Bruce Walters is a 20 y.o. male with medical history significant of DM; obesity; and asthma presenting with CP/SOB.  He started having CP, SOB, and abdominal pain.  Symptoms started about a week ago.  SOB is all the time, worse with moving around.  +cough.  No sick contacts.  He has not had COVID.  He took a prolonged car trip to [place] about a week ago - about 9 hours, maybe 3 stops each way.  No tobacco.  No injuries, surgeries.  No FH of VTE other than maternal great-grandmother.  No leg symptoms.  He was diagnosed with DM in February - polyuria and abdominal pain.  He is on Metformin monotherapy, generally good at remembering to take it.      ED Course: Carryover, per Dr. Margo Aye:  Constant chest pain, SOB, cough of 1 week duration.  Exacerbated by activity.  Associated with nausea and vomiting, 1-2 episodes per day since symptoms started.  He reports taking his home medications as prescribed.  No sick contacts.  No diarrhea.  Recent lengthy trip to [place] in less than 2 weeks, via motor vehicle.  Work up in the ED revealed B/L central PE with no evidence of R heart strain on CT scan.  Started on heparin drip after receiving heparin bolus.  2 sets of troponin are elevated.  Ordered 2D echo, BNP, and B/L LE duplex US.  TOC consulted to assist with DOAC for dc planning.   Review of Systems: As per HPI; otherwise review of systems reviewed and negative.   Ambulatory Status:  Ambulates without assistance  COVID Vaccine Status:  Complete  Past Medical History:  Diagnosis Date   Asthma    Class 2 severe obesity due to excess calories with serious comorbidity and body mass index (BMI) of 36.0 to 36.9 in  adult [facility])    Diabetes mellitus without complication (HCC)     History reviewed. No pertinent surgical history.  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   Social Determinants of Health   Financial Resource Strain: Not on file  Food Insecurity: Not on file  Transportation Needs: Not on file  Physical Activity: Not on file  Stress: Not on file  Social Connections: Not on file  Intimate Partner Violence: Not on file    No Known Allergies  History reviewed. No pertinent family history.  Prior to Admission medications   Medication Sig Start Date End Date Taking? Authorizing Provider  glipiZIDE (GLUCOTROL) 5 MG tablet Take 1 tablet (5 mg total) by mouth 2 (two) times daily before a meal. 09/29/20   Wallis Bamberg, PA-C  metFORMIN (GLUCOPHAGE) 1000 MG tablet Take 1 tablet (1,000 mg total) by mouth 2 (two) times daily with a meal. 09/29/20   Wallis BambergMani, Mario, PA-C  ondansetron (ZOFRAN-ODT) 8 MG disintegrating tablet Take 1 tablet (8 mg total) by mouth every 8 (eight) hours as needed for nausea or vomiting. 09/29/20   Wallis BambergMani, Mario, PA-C    Physical Exam: Vitals:   02/08/21 0745 02/08/21 0800 02/08/21 0803 02/08/21 0845  BP: 136/82 135/79  125/82  Pulse: 84 88  91  Resp: (!) 27 (!) 24  17  Temp:   98.1 F (36.7 C)   TempSrc:   Oral   SpO2: 94% 92%  96%     General:  Appears calm and comfortable and is in NAD Eyes:  PERRL, EOMI, normal lids, iris ENT:  grossly normal hearing, lips & tongue,  mmm; appropriate dentition Neck:  no LAD, masses or thyromegaly Cardiovascular:  RRR, no m/r/g. No LE edema.  Respiratory:   CTA bilaterally with no wheezes/rales/rhonchi.  Normal respiratory effort, on room air. Abdomen:  soft, NT, ND Skin:  no rash or induration seen on limited exam Musculoskeletal:  grossly normal tone BUE/BLE, good ROM, no bony abnormality Lower extremity:  No LE edema.  Limited foot exam with no ulcerations.  2+ distal pulses. Psychiatric:  blunted mood and affect, speech fluent and appropriate, AOx3 Neurologic:  CN 2-12 grossly intact, moves all extremities in coordinated fashion    Radiological Exams on Admission: Independently reviewed - see discussion in A/P where applicable  DG Chest 2 View  Result Date: 02/07/2021 CLINICAL DATA:  Shortness of breath and chest pain. EXAM: CHEST - 2 VIEW COMPARISON:  02/05/2021 FINDINGS: Low volume film without focal airspace consolidation or pleural effusion. No pneumothorax or evidence of pulmonary edema. The cardiopericardial silhouette is within normal limits for size. The visualized bony structures of the thorax show no acute abnormality. IMPRESSION: Low volume film without acute cardiopulmonary findings. Electronically Signed   By: Kennith CenterEric  Mansell M.D.   On: 02/07/2021 20:29   CT Angio Chest PE W and/or Wo Contrast  Result Date: 02/08/2021 CLINICAL DATA:  Chest pain and shortness of breath for 1 week. Worse with exertion. Ulcer with abdominal pain. EXAM: CT ANGIOGRAPHY CHEST WITH CONTRAST TECHNIQUE: Multidetector CT imaging of the chest was performed using the standard protocol during bolus administration of intravenous contrast. Multiplanar CT image reconstructions and MIPs were obtained to evaluate the vascular anatomy. CONTRAST:  75mL OMNIPAQUE IOHEXOL 350 MG/ML SOLN COMPARISON:  Chest x-ray 09/22/2020, chest x-ray 02/03/2021, chest x-ray 02/05/2021. FINDINGS: Cardiovascular: Satisfactory opacification of the pulmonary arteries  to the segmental level. Bilateral central pulmonary emboli that extend to bilateral lower and upper lobes at the segmental and subsegmental levels. Normal heart size. Trace pericardial effusion. Thoracic aorta is normal in caliber. Mediastinum/Nodes: No enlarged mediastinal, hilar, or axillary lymph nodes. Thyroid gland, trachea, and esophagus demonstrate no significant findings. Lungs/Pleura: Low lung volumes. Bibasilar atelectasis. Right apical ground-glass airspace opacity measuring up to 3.2 cm with limited evaluation due to streak artifact originating from intravenous contrast. Subjacent there is a peripheral vague ground-glass airspace opacity within right upper lobe measuring up to 8 mm (11: 64-65). Bilateral trace pleural effusions. No pneumothorax peer Upper Abdomen: No acute abnormality. Musculoskeletal: No chest wall abnormality. No suspicious lytic or blastic osseous lesions. No acute displaced fracture. Review of the MIP images confirms the above findings. IMPRESSION: 1. Bilateral central pulmonary emboli extending to the bilateral upper and lower lobes at the segmental and subsegmental  levels. No right heart strain. No definite pulmonary infarction. 2. Right apical 3.2 cm ground-glass airspace opacity versus streak artifact in the setting of adjacent intravenous contrast. Subjacent peripheral 0.8 cm vague ground-glass airspace opacity. Finding could represent infection/inflammation versus developing pulmonary infarction versus artifact versus less likely adenocarcinoma. Recommend short-term follow-up with cross-sectional imaging as this finding is not visualized on the radiograph. 3. Bilateral trace pleural effusions. 4. Trace pericardial effusion. These results were called by telephone at the time of interpretation on 02/08/2021 at 6:00 am to provider Dr. April Manson, who verbally acknowledged these results. Electronically Signed   By: Tish Frederickson M.D.   On: 02/08/2021 06:04   VAS [place] LOWER  EXTREMITY VENOUS (DVT)  Result Date: 02/08/2021  Lower Venous DVT Study Patient Name:  KAYLUB DETIENNE  Date of Exam:   02/08/2021 Medical Rec #: [id number]      Accession #:    [id number] Date of Birth: 01/11/2001      Patient Gender: M Patient Age:   19Y Exam Location:  [facility] Procedure:      VAS [place] LOWER EXTREMITY VENOUS (DVT) Referring Phys: [id number] CAROLE N HALL --------------------------------------------------------------------------------  Indications: Pulmonary embolism.  Comparison Study: no prior Performing Technologist: [place] Ponder RVS  Examination Guidelines: A complete evaluation includes B-mode imaging, spectral Doppler, color Doppler, and power Doppler as needed of all accessible portions of each vessel. Bilateral testing is considered an integral part of a complete examination. Limited examinations for reoccurring indications may be performed as noted. The reflux portion of the exam is performed with the patient in reverse Trendelenburg.  +---------+---------------+---------+-----------+----------+--------------+ RIGHT    CompressibilityPhasicitySpontaneityPropertiesThrombus Aging +---------+---------------+---------+-----------+----------+--------------+ CFV      Full           Yes      Yes                                 +---------+---------------+---------+-----------+----------+--------------+ SFJ      Full                                                        +---------+---------------+---------+-----------+----------+--------------+ FV Prox  Full                                                        +---------+---------------+---------+-----------+----------+--------------+ FV Mid   Full                                                        +---------+---------------+---------+-----------+----------+--------------+ FV DistalFull                                                         +---------+---------------+---------+-----------+----------+--------------+ PFV      Full                                                        +---------+---------------+---------+-----------+----------+--------------+  POP      Full           Yes      Yes                                 +---------+---------------+---------+-----------+----------+--------------+ PTV      Full                                                        +---------+---------------+---------+-----------+----------+--------------+ PERO     Full                                                        +---------+---------------+---------+-----------+----------+--------------+   +---------+---------------+---------+-----------+----------+--------------+ LEFT     CompressibilityPhasicitySpontaneityPropertiesThrombus Aging +---------+---------------+---------+-----------+----------+--------------+ CFV      Full           Yes      Yes                                 +---------+---------------+---------+-----------+----------+--------------+ SFJ      Full                                                        +---------+---------------+---------+-----------+----------+--------------+ FV Prox  Full                                                        +---------+---------------+---------+-----------+----------+--------------+ FV Mid   Full                                                        +---------+---------------+---------+-----------+----------+--------------+ FV DistalFull                                                        +---------+---------------+---------+-----------+----------+--------------+ PFV      Full                                                        +---------+---------------+---------+-----------+----------+--------------+ POP      Full           Yes      Yes                                  +---------+---------------+---------+-----------+----------+--------------+  PTV      Full                                                        +---------+---------------+---------+-----------+----------+--------------+ PERO     Full                                                        +---------+---------------+---------+-----------+----------+--------------+     Summary: BILATERAL: - No evidence of deep vein thrombosis seen in the lower extremities, bilaterally. -No evidence of popliteal cyst, bilaterally.   *See table(s) above for measurements and observations.    Preliminary     EKG: Independently reviewed.  Sinus tachycardia with rate 113; no evidence of acute ischemia   Labs on Admission: I have personally reviewed the available labs and imaging studies at the time of the admission.  Pertinent labs:   Glucose 243 HS troponin 30, 32 Normal CBC COVID/flu negative   Assessment/Plan Principal Problem:   Pulmonary embolism, bilateral (HCC) Active Problems:   Class 2 severe obesity due to excess calories with serious comorbidity and body mass index (BMI) of 36.0 to 36.9 in adult [facility])   Diabetes mellitus without complication (HCC)   PE -Patient without prior episodes of thromboembolic disease presenting with new PE -No FH to suggest clotting disorder -Recent (09/2020) diagnosis of DM with suboptimal control, obesity, and prolonged car trip most likely RF -Will observe overnight on telemetry -No O2 requirement -No R heart strain appreciated on CT; minimal troponin elevation  -Initiate anticoagulation - for now, will start treatment-dose heparin -He is likely able to transition to alternative [facility] agent tomorrow - given his age and for simplicity reasons (to encourage compliance), would suggest once daily Xarelto -[place] O2 as needed -We discussed oral AC treatment options and risks/benefits including frequent MD visits and dietary regulation with Coumadin vs. Significant  expense with DOAC therapy -Will request TOC team consultation to assist with cost analysis of the various DOAC options based on his insurance, but Xarelto appears to be reasonable given that he has Medicaid -Patients are at intermediate risk (3-8%/year) for recurrent VTE if initial clot occurred after long duration of travel.  Extended oral anticoagulation of indefinite duration should be considered for patients with a first episode of PE with these issues, but given his age it is probably reasonable to do trial off medication after 6+ months with the plan for lifelong AC if he ever has another event. -The patient understands that thromboembolic disease can be catastrophic and even deadly and that he must be complaint with physician appointments and anticoagulation. -Pulmonology consulted given young age and severity of clot burden -Consider hematology consult as outpatient  DM -He was seen at [facility] and then ED on 10/15/20 and found to have glucose in the 300-600s -He reports general compliance with metformin, has not been taking glipizide -He has been unable to f/u with a PCP due to insurance reasons -Modestly elevated glucose today, A1c is 10.6 indicating poor control -He would benefit from diabetes education and nutrition consultation -TOC consult to assist with insurance issues/establishing care with PCP  Obesity -BMI 36.08 -Weight loss should be encouraged -Outpatient PCP/bariatric  medicine/bariatric surgery f/u encouraged      Note: This patient has been tested and is negative for the novel coronavirus COVID-19. The patient has been fully vaccinated against COVID-19.   Level of care: Telemetry Medical DVT prophylaxis: Heparin Code Status:  Full  Family Communication: Mother was present throughout evaluation. Disposition Plan:  The patient is from: home  Anticipated d/c is to: home without [facility] services   Anticipated d/c date will depend on clinical response to treatment, but possibly  as early as tomorrow if he has excellent response to treatment  Patient is currently: acutely ill Consults called: Pulmonology  Admission status:  It is my clinical opinion that referral for OBSERVATION is reasonable and necessary in this patient based on the above information provided. The aforementioned taken together are felt to place the patient at high risk for further clinical deterioration. However it is anticipated that the patient may be medically stable for discharge from the hospital within 24 to 48 hours.    Jonah Blue MD [facility]   How to contact the [facility] Attending or Consulting provider 7A - 7P or covering provider during after hours 7P -7A, for this patient?  Check the care team in [facility] and look for a) attending/consulting [facility] provider listed and b) the [facility] team listed Log into [email or web address] and use [facility] universal password to access. If you do not have the password, please contact the hospital operator. Locate the [facility] provider you are looking for under [facility] and page to a number that you can be directly reached. If you still have difficulty reaching the provider, please page the [facility] (Director on Call) for the Hospitalists listed on amion for assistance.   02/08/2021, 9:29 AM

## 2021-02-08 NOTE — Progress Notes (Signed)
Lower extremity venous has been completed.   Preliminary results in CV Proc.   Blanch Media 02/08/2021 9:26 AM

## 2021-02-08 NOTE — Consult Note (Signed)
NAME:  Bruce Walters, MRN:  [id number], DOB:  11/23/2000, LOS: 0 ADMISSION DATE:  02/07/2021, CONSULTATION DATE:  6/22 REFERRING MD:  Ophelia Charter, CHIEF COMPLAINT:  Chest pain and dyspnea   History of Present Illness:  20 year old male presented to the [facility] emergency department on February 08, 2021 with a chief complaint of chest tightness and shortness of breath.  He has a past medical history significant for mild intermittent asthma and recently diagnosed diabetes mellitus.  He has not seen a primary care physician for this and has not been treated for his diabetes yet.  He has tried changing his diet and has lost a few pounds in the last few months.  Over a week ago he traveled to [place] for 9 hours in a car ride and returned in a similar fashion.  On both the trip down and back he made 1 stop to go to the bathroom.  Not long after his return he developed the onset of chest pain and shortness of breath with exertion.  This problem has persisted over the last week and has not improved.  He has not noted any factors that improve the symptom, he does have chest pain at rest.  He says that the chest pain and shortness of breath get worse when he exerts himself.  He denies any leg pain or swelling.  He says that he is fully vaccinated against COVID, his last shot was in October 2021.  He has never had COVID to his knowledge.  He denies any use of alcohol, tobacco or drugs.  He says that throughout his childhood he never experienced significant shortness of breath except when he would have an asthma flareup.  Apparently this was never severe, he was never hospitalized for this and he only used ventolyn as needed.  There is no family history of blood clots with the exception of his maternal grandmother who had 1 diagnosed after her diagnosis of malignancy.  He denies syncope or pre-syncope symptoms.  Pertinent  Medical History  Obesity Diabetes mellitus, type 2 Mild persistent asthma  Significant Hospital  Events: Including procedures, antibiotic start and stop dates in addition to other pertinent events   June 22 admission  Interim History / Subjective:  As above  Objective   Blood pressure 135/79, pulse 88, temperature 98.1 F (36.7 C), temperature source Oral, resp. rate (!) 24, SpO2 92 %.       No intake or output data in the 24 hours ending 02/08/21 0850 There were no vitals filed for this visit.  Examination: General:  Obese, resting comfortably in bed HENT: NCAT OP clear PULM: CTA B, normal effort CV: RRR, no mgr GI: BS+, soft, nontender MSK: normal bulk and tone Neuro: awake, alert, no distress, MAEW   Labs/imaging that I havepersonally reviewed  (right click and "Reselect all SmartList Selections" daily)  June 22 CT angiogram chest images independently reviewed showing bilateral pulmonary embolism, nonspecific groundglass opacification in the right upper lobe, undetermined significance, the remainder of the pulmonary parenchyma is within normal limits.  No evidence of right ventricular strain on CT.  Resolved Hospital Problem list     Assessment & Plan:  Acute pulmonary embolism: Unusual presentation as we do not typically see this in 20 year olds.  There have been reports of increasing pulmonary emboli in the pediatric population over the last several years (Pediatrics. 6269;485(4)).  Based on review of the objective data (labs, vital signs) this is a low risk pulmonary embolism despite being large and  bilateral on imaging.  This was provoked by his long car trip.  Under normal circumstances I think it would be reasonable to treat for 3 to 6 months as it was provoked, however considering he is 20 years old I recommend having him seen by a hematologist after hospital discharge to assess for a thrombophilia. > Admit to MedSurg bed by hospitalist > Heparin today, Xarelto tomorrow > Up ad lib.  Atypical pulmonary nodule right upper lobe, undetermined significance; appears  inflammatory or possibly pulmonary infarct > Will arrange follow-up with [facility] for pulmonary embolism and the pulmonary nodule which can be evaluated with serial imaging.  PCCM will sign off, call if questions  Best Practice (right click and "Reselect all SmartList Selections" daily)   Per Primary     Labs   CBC: Recent Labs  Lab 02/03/21 1757 02/05/21 1932 02/07/21 1912 02/08/21 0640  WBC 8.2 8.1 9.1 11.2*  NEUTROABS 5.1  --  5.8  --   HGB 16.0 16.0 16.1 15.3  HCT 46.5 46.6 47.7 45.0  MCV 81.9 81.3 82.2 82.1  PLT 236 222 200 185    Basic Metabolic Panel: Recent Labs  Lab 02/03/21 1757 02/05/21 1932 02/07/21 1912  NA 137 136 135  K 4.0 3.9 3.8  CL 99 99 100  CO2 29 26 24   GLUCOSE 212* 230* 243*  BUN <5* 5* 6  CREATININE 1.19 1.17 1.28*  CALCIUM 9.6 9.6 9.5  MG  --   --  1.7   GFR: Estimated Creatinine Clearance: 143.9 mL/min (A) (by C-G formula based on SCr of 1.28 mg/dL (H)). Recent Labs  Lab 02/03/21 1757 02/05/21 1932 02/07/21 1912 02/08/21 0640  WBC 8.2 8.1 9.1 11.2*    Liver Function Tests: Recent Labs  Lab 02/03/21 1757 02/05/21 1932  AST 26 26  ALT 41 39  ALKPHOS 51 50  BILITOT 0.7 0.7  PROT 7.0 7.2  ALBUMIN 3.7 3.9   Recent Labs  Lab 02/03/21 1757 02/05/21 1932  LIPASE 34 36   No results for input(s): AMMONIA in the last 168 hours.  ABG No results found for: PHART, PCO2ART, PO2ART, HCO3, TCO2, ACIDBASEDEF, O2SAT   Coagulation Profile: No results for input(s): INR, PROTIME in the last 168 hours.  Cardiac Enzymes: No results for input(s): CKTOTAL, CKMB, CKMBINDEX, TROPONINI in the last 168 hours.  HbA1C: No results found for: HGBA1C  CBG: Recent Labs  Lab 02/03/21 1758 02/08/21 0820  GLUCAP 205* 144*    Review of Systems:   Gen: Denies fever, chills, + weight change (intentional weight loss with diet change recently), fatigue, night sweats HEENT: Denies blurred vision, double vision, hearing loss,  tinnitus, sinus congestion, rhinorrhea, sore throat, neck stiffness, dysphagia PULM: per HPI CV: per HPI GI: Denies abdominal pain, nausea, vomiting, diarrhea, hematochezia, melena, constipation, change in bowel habits GU: Denies dysuria, hematuria, polyuria, oliguria, urethral discharge Endocrine: Denies hot or cold intolerance, polyuria, polyphagia or appetite change Derm: Denies rash, dry skin, scaling or peeling skin change Heme: Denies easy bruising, bleeding, bleeding gums Neuro: Denies headache, numbness, weakness, slurred speech, loss of memory or consciousness   Past Medical History:  He,  has a past medical history of Asthma and Diabetes mellitus without complication (HCC).   Surgical History:  History reviewed. No pertinent surgical history.   Social History:   reports that he has never smoked. He has never used smokeless tobacco. He reports that he does not drink alcohol and does not use drugs.   Family  History:  His family history is not on file.   Allergies No Known Allergies   Home Medications  Prior to Admission medications   Medication Sig Start Date End Date Taking? Authorizing Provider  glipiZIDE (GLUCOTROL) 5 MG tablet Take 1 tablet (5 mg total) by mouth 2 (two) times daily before a meal. 09/29/20   Wallis Bamberg, PA-C  metFORMIN (GLUCOPHAGE) 1000 MG tablet Take 1 tablet (1,000 mg total) by mouth 2 (two) times daily with a meal. 09/29/20   Wallis Bamberg, PA-C  ondansetron (ZOFRAN-ODT) 8 MG disintegrating tablet Take 1 tablet (8 mg total) by mouth every 8 (eight) hours as needed for nausea or vomiting. 09/29/20   Wallis Bamberg, PA-C     Critical care time: n/a     Heber [place], MD [facility] Pager: [phone] Cell: [phone] If no response, please call [phone] until 7pm After 7:00 pm call Elink  [phone]

## 2021-02-08 NOTE — Progress Notes (Signed)
ANTICOAGULATION CONSULT NOTE - Follow Up Consult  Pharmacy Consult for Heparin Indication: pulmonary embolus  No Known Allergies  Patient Measurements: Height: 6\' 5"  (195.6 cm) IBW/kg (Calculated) : 89.1 Heparin Dosing Weight: 119.4 kg  Vital Signs: Temp: 98.2 F (36.8 C) (06/22 2040) Temp Source: Oral (06/22 2040) BP: 134/70 (06/22 2040) Pulse Rate: 76 (06/22 2040)  Labs: Recent Labs    02/07/21 1912 02/07/21 2241 02/08/21 0640 02/08/21 1325 02/08/21 2155  HGB 16.1  --  15.3  --   --   HCT 47.7  --  45.0  --   --   PLT 200  --  185  --   --   HEPARINUNFRC  --   --   --  0.27* 0.36  CREATININE 1.28*  --  1.17  --   --   TROPONINIHS 30* 32*  --   --   --      Estimated Creatinine Clearance: 157.4 mL/min (by C-G formula based on SCr of 1.17 mg/dL).    Assessment: Anticoag: hep gtt for PE, no right heart strain.   Hep level 0.36.     Goal of Therapy:  Heparin level 0.3-0.7 units/ml Monitor platelets by anticoagulation protocol: Yes   Plan:  Continue IV heparin at 2000 units/hr Daily HL and CBC   02/10/21, PharmD, [facility] Clinical Pharmacist Please see AMION for all Pharmacists' Contact Phone Numbers 02/08/2021, 10:52 PM

## 2021-02-09 ENCOUNTER — Other Ambulatory Visit (HOSPITAL_COMMUNITY): Payer: Self-pay

## 2021-02-09 DIAGNOSIS — Z20822 Contact with and (suspected) exposure to covid-19: Secondary | ICD-10-CM | POA: Diagnosis present

## 2021-02-09 DIAGNOSIS — J4530 Mild persistent asthma, uncomplicated: Secondary | ICD-10-CM | POA: Diagnosis present

## 2021-02-09 DIAGNOSIS — R911 Solitary pulmonary nodule: Secondary | ICD-10-CM | POA: Diagnosis present

## 2021-02-09 DIAGNOSIS — I2699 Other pulmonary embolism without acute cor pulmonale: Secondary | ICD-10-CM | POA: Diagnosis present

## 2021-02-09 DIAGNOSIS — Z7984 Long term (current) use of oral hypoglycemic drugs: Secondary | ICD-10-CM | POA: Diagnosis not present

## 2021-02-09 DIAGNOSIS — Z6836 Body mass index (BMI) 36.0-36.9, adult: Secondary | ICD-10-CM | POA: Diagnosis not present

## 2021-02-09 DIAGNOSIS — E1165 Type 2 diabetes mellitus with hyperglycemia: Secondary | ICD-10-CM | POA: Diagnosis present

## 2021-02-09 LAB — BASIC METABOLIC PANEL
Anion gap: 8 (ref 5–15)
BUN: 6 mg/dL (ref 6–20)
CO2: 29 mmol/L (ref 22–32)
Calcium: 9.4 mg/dL (ref 8.9–10.3)
Chloride: 100 mmol/L (ref 98–111)
Creatinine, Ser: 1.26 mg/dL — ABNORMAL HIGH (ref 0.61–1.24)
GFR, Estimated: >60 mL/min (ref >60)
Glucose, Bld: 132 mg/dL — ABNORMAL HIGH (ref 70–99)
Potassium: 3.9 mmol/L (ref 3.5–5.1)
Sodium: 137 mmol/L (ref 135–145)

## 2021-02-09 LAB — CBC
HCT: 43.2 % (ref 39.0–52.0)
Hemoglobin: 14.7 g/dL (ref 13.0–17.0)
MCH: 27.7 pg (ref 26.0–34.0)
MCHC: 34 g/dL (ref 30.0–36.0)
MCV: 81.5 fL (ref 80.0–100.0)
Platelets: 188 10*3/uL (ref 150–400)
RBC: 5.3 MIL/uL (ref 4.22–5.81)
RDW: 12 % (ref 11.5–15.5)
WBC: 8.9 10*3/uL (ref 4.0–10.5)
nRBC: 0 % (ref 0.0–0.2)

## 2021-02-09 LAB — GLUCOSE, CAPILLARY
Glucose-Capillary: 140 mg/dL — ABNORMAL HIGH (ref 70–99)
Glucose-Capillary: 149 mg/dL — ABNORMAL HIGH (ref 70–99)
Glucose-Capillary: 163 mg/dL — ABNORMAL HIGH (ref 70–99)
Glucose-Capillary: 153 mg/dL — ABNORMAL HIGH (ref 70–99)

## 2021-02-09 LAB — HEPARIN LEVEL (UNFRACTIONATED): Heparin Unfractionated: 0.15 IU/mL — ABNORMAL LOW (ref 0.30–0.70)

## 2021-02-09 MED ORDER — GUAIFENESIN-DM 100-10 MG/5ML PO SYRP
10.0000 mL | ORAL_SOLUTION | Freq: Four times a day (QID) | ORAL | Status: DC | PRN
  Administered 2021-02-09: 10 mL via ORAL
  Filled 2021-02-09: qty 10

## 2021-02-09 MED ORDER — RIVAROXABAN (XARELTO) VTE STARTER PACK (15 & 20 MG)
ORAL_TABLET | ORAL | 0 refills | Status: DC
  Filled 2021-02-09: qty 51, 30d supply, fill #0

## 2021-02-09 MED ORDER — HEPARIN BOLUS VIA INFUSION
3500.0000 [IU] | Freq: Once | INTRAVENOUS | Status: AC
  Administered 2021-02-09: 3500 [IU] via INTRAVENOUS
  Filled 2021-02-09: qty 3500

## 2021-02-09 MED ORDER — MAGNESIUM SULFATE 2 GM/50ML IV SOLN
2.0000 g | Freq: Once | INTRAVENOUS | Status: AC
  Administered 2021-02-09: 2 g via INTRAVENOUS
  Filled 2021-02-09: qty 50

## 2021-02-09 MED ORDER — RIVAROXABAN 20 MG PO TABS: 20.0000 mg | ORAL_TABLET | Freq: Every day | ORAL | Status: DC

## 2021-02-09 MED ORDER — RIVAROXABAN 15 MG PO TABS
15.0000 mg | ORAL_TABLET | Freq: Two times a day (BID) | ORAL | Status: DC
  Administered 2021-02-09 – 2021-02-10 (×3): 15 mg via ORAL
  Filled 2021-02-09 (×3): qty 1

## 2021-02-09 MED ORDER — ALUM & MAG HYDROXIDE-SIMETH 200-200-20 MG/5ML PO SUSP: 15.0000 mL | Freq: Four times a day (QID) | ORAL | Status: DC | PRN

## 2021-02-09 MED ORDER — DOCUSATE SODIUM 100 MG PO CAPS: 100.0000 mg | ORAL_CAPSULE | Freq: Two times a day (BID) | ORAL | Status: DC | PRN

## 2021-02-09 MED ORDER — CALCIUM CARBONATE ANTACID 500 MG PO CHEW: 1.0000 | CHEWABLE_TABLET | Freq: Three times a day (TID) | ORAL | Status: DC | PRN

## 2021-02-09 NOTE — Progress Notes (Signed)
PROGRESS NOTE    Walters Bruce  DSK:[id number] DOB: [birth year] DOA: 02/07/2021 PCP: Patient, No Pcp Per (Inactive)  [id number]-01   Assessment & Plan:   Principal Problem:   Pulmonary embolism, bilateral (HCC) Active Problems:   Class 2 severe obesity due to excess calories with serious comorbidity and body mass index (BMI) of 36.0 to 36.9 in adult [facility])   Diabetes mellitus without complication (HCC)   Pulmonary embolus (HCC)   Bruce Walters is a 20 y.o. Walters with medical history significant of DM, obesity, and asthma presenting with CP/SOB found to have bilateral PE.   Bilateral PE -Patient without prior episodes of thromboembolic disease presenting with new PE -No FH to suggest clotting disorder -Recent prolonged car trip, however, neg for DVT in BLE. --started on IV heparin on admission --pulm consulted on admission. Plan: --transition to Xarelto today --outpatient hematologist referral  Atypical pulmonary nodule right upper lobe, undetermined significance --appears inflammatory or possibly pulmonary infarct --outpatient followup with  pulmonary    DM, poorly controlled -He was seen at [facility] and then ED on 10/15/20 and found to have glucose in the 300-600s -He reports general compliance with metformin, has not been taking glipizide -He has been unable to f/u with a PCP due to insurance reasons -Modestly elevated glucose today, A1c is 10.6 indicating poor control Plan: --hold metformin while inpatient --SSI --will establish with PCP now that pt has MediCaid   Obesity -BMI 36.08  Hypomag --replete with IV mag  Abdominal pain --diffuse, non-specific and present for months.  Normal oral intake and BM. --may be due to uncontrolled DM. --Monitor for now   DVT prophylaxis: IO:MBTDHRC  Code Status: Full code  Family Communication:  Level of care: Med-Surg Dispo:   The patient is from: home Anticipated d/c is to: home Anticipated d/c date is:  tomorrow   Subjective and Interval History:  Pt complained of chest tightness unchanged from prior.  Also complained of diffuse non-specific abdominal pain that's been present for months.  No pain or swelling in BLE.   Objective: Vitals:   02/08/21 2040 02/09/21 0559 02/09/21 1437 02/09/21 2010  BP: 134/70 131/75 136/78 128/75  Pulse: 76 73 87 96  Resp: 20 18 17 18   Temp: 98.2 F (36.8 C) 98.3 F (36.8 C) 98.9 F (37.2 C) 98.4 F (36.9 C)  TempSrc: Oral Oral Oral Oral  SpO2: 98% 98% 94% 95%  Height:        Intake/Output Summary (Last 24 hours) at 02/10/2021 0007 Last data filed at 02/09/2021 1300 Gross per 24 hour  Intake 120 ml  Output --  Net 120 ml   There were no vitals filed for this visit.  Examination:   Constitutional: NAD, AAOx3 HEENT: conjunctivae and lids normal, EOMI CV: No cyanosis.   RESP: normal respiratory effort, on RA GI: abdomen soft, non-tender to palpation Extremities: No effusions, edema in BLE SKIN: warm, dry Neuro: II - XII grossly intact.   Psych: depressed mood and affect.  Appropriate judgement and reason   Data Reviewed: I have personally reviewed following labs and imaging studies  CBC: Recent Labs  Lab 02/03/21 1757 02/05/21 1932 02/07/21 1912 02/08/21 0640 02/09/21 0221  WBC 8.2 8.1 9.1 11.2* 8.9  NEUTROABS 5.1  --  5.8  --   --   HGB 16.0 16.0 16.1 15.3 14.7  HCT 46.5 46.6 47.7 45.0 43.2  MCV 81.9 81.3 82.2 82.1 81.5  PLT 236 222 200 185 188   Basic Metabolic Panel: Recent  Labs  Lab 02/03/21 1757 02/05/21 1932 02/07/21 1912 02/08/21 0640 02/09/21 0221  NA 137 136 135 137 137  K 4.0 3.9 3.8 4.0 3.9  CL 99 99 100 101 100  CO2 GLUCOSE 212* 230* 243* 150* 132*  BUN <5* 5* 6 5* 6  CREATININE 1.19 1.17 1.28* 1.17 1.26*  CALCIUM 9.6 9.6 9.5 9.2 9.4  MG  --   --  1.7 1.6*  --   PHOS  --   --   --  4.1  --    GFR: Estimated Creatinine Clearance: 146.2 mL/min (A) (by C-G formula based on SCr of 1.26  mg/dL (H)). Liver Function Tests: Recent Labs  Lab 02/03/21 1757 02/05/21 1932  AST 26 26  ALT 41 39  ALKPHOS 51 50  BILITOT 0.7 0.7  PROT 7.0 7.2  ALBUMIN 3.7 3.9   Recent Labs  Lab 02/03/21 1757 02/05/21 1932  LIPASE 34 36   No results for input(s): AMMONIA in the last 168 hours. Coagulation Profile: No results for input(s): INR, PROTIME in the last 168 hours. Cardiac Enzymes: No results for input(s): CKTOTAL, CKMB, CKMBINDEX, TROPONINI in the last 168 hours. BNP (last 3 results) No results for input(s): PROBNP in the last 8760 hours. HbA1C: Recent Labs    02/08/21 0639  HGBA1C 10.6*   CBG: Recent Labs  Lab 02/08/21 2042 02/09/21 0733 02/09/21 1115 02/09/21 1608 02/09/21 2013  GLUCAP 138* 140* 149* 163* 153*   Lipid Profile: No results for input(s): CHOL, HDL, LDLCALC, TRIG, CHOLHDL, LDLDIRECT in the last 72 hours. Thyroid Function Tests: No results for input(s): TSH, T4TOTAL, FREET4, T3FREE, THYROIDAB in the last 72 hours. Anemia Panel: No results for input(s): VITAMINB12, FOLATE, FERRITIN, TIBC, IRON, RETICCTPCT in the last 72 hours. Sepsis Labs: No results for input(s): PROCALCITON, LATICACIDVEN in the last 168 hours.  Recent Results (from the past 240 hour(s))  Resp Panel by RT-PCR (Flu A&B, Covid) Nasopharyngeal Swab     Status: None   Collection Time: 02/08/21  3:45 AM   Specimen: Nasopharyngeal Swab; Nasopharyngeal(NP) swabs in vial transport medium  Result Value Ref Range Status   SARS Coronavirus 2 by RT PCR NEGATIVE NEGATIVE Final    Comment: (NOTE) SARS-CoV-2 target nucleic acids are NOT DETECTED.  The SARS-CoV-2 RNA is generally detectable in upper respiratory specimens during the acute phase of infection. The lowest concentration of SARS-CoV-2 viral copies this assay can detect is 138 copies/mL. A negative result does not preclude SARS-Cov-2 infection and should not be used as the sole basis for treatment or other patient management  decisions. A negative result may occur with  improper specimen collection/handling, submission of specimen other than nasopharyngeal swab, presence of viral mutation(s) within the areas targeted by this assay, and inadequate number of viral copies(<138 copies/mL). A negative result must be combined with clinical observations, patient history, and epidemiological information. The expected result is Negative.  Fact Sheet for Patients:  BloggerCourse.com  Fact Sheet for Healthcare Providers:  SeriousBroker.it  This test is no t yet approved or cleared by the [place] FDA and  has been authorized for detection and/or diagnosis of SARS-CoV-2 by FDA under an Emergency Use Authorization (EUA). This EUA will remain  in effect (meaning this test can be used) for the duration of the COVID-19 declaration under Section 564(b)(1) of the Act, 21 U.S.C.section 360bbb-3(b)(1), unless the authorization is terminated  or revoked sooner.       Influenza A by PCR  NEGATIVE NEGATIVE Final   Influenza B by PCR NEGATIVE NEGATIVE Final    Comment: (NOTE) The Xpert Xpress SARS-CoV-2/FLU/RSV plus assay is intended as an aid in the diagnosis of influenza from Nasopharyngeal swab specimens and should not be used as a sole basis for treatment. Nasal washings and aspirates are unacceptable for Xpert Xpress SARS-CoV-2/FLU/RSV testing.  Fact Sheet for Patients: BloggerCourse.com  Fact Sheet for Healthcare Providers: SeriousBroker.it  This test is not yet approved or cleared by the [place] FDA and has been authorized for detection and/or diagnosis of SARS-CoV-2 by FDA under an Emergency Use Authorization (EUA). This EUA will remain in effect (meaning this test can be used) for the duration of the COVID-19 declaration under Section 564(b)(1) of the Act, 21 U.S.C. section 360bbb-3(b)(1), unless the  authorization is terminated or revoked.  Performed at [facility], [address] [place], [place] [address]       Radiology Studies: CT Angio Chest PE W and/or Wo Contrast  Result Date: 02/08/2021 CLINICAL DATA:  Chest pain and shortness of breath for 1 week. Worse with exertion. Ulcer with abdominal pain. EXAM: CT ANGIOGRAPHY CHEST WITH CONTRAST TECHNIQUE: Multidetector CT imaging of the chest was performed using the standard protocol during bolus administration of intravenous contrast. Multiplanar CT image reconstructions and MIPs were obtained to evaluate the vascular anatomy. CONTRAST:  75mL OMNIPAQUE IOHEXOL 350 MG/ML SOLN COMPARISON:  Chest x-ray 09/22/2020, chest x-ray 02/03/2021, chest x-ray 02/05/2021. FINDINGS: Cardiovascular: Satisfactory opacification of the pulmonary arteries to the segmental level. Bilateral central pulmonary emboli that extend to bilateral lower and upper lobes at the segmental and subsegmental levels. Normal heart size. Trace pericardial effusion. Thoracic aorta is normal in caliber. Mediastinum/Nodes: No enlarged mediastinal, hilar, or axillary lymph nodes. Thyroid gland, trachea, and esophagus demonstrate no significant findings. Lungs/Pleura: Low lung volumes. Bibasilar atelectasis. Right apical ground-glass airspace opacity measuring up to 3.2 cm with limited evaluation due to streak artifact originating from intravenous contrast. Subjacent there is a peripheral vague ground-glass airspace opacity within right upper lobe measuring up to 8 mm (11: 64-65). Bilateral trace pleural effusions. No pneumothorax peer Upper Abdomen: No acute abnormality. Musculoskeletal: No chest wall abnormality. No suspicious lytic or blastic osseous lesions. No acute displaced fracture. Review of the MIP images confirms the above findings. IMPRESSION: 1. Bilateral central pulmonary emboli extending to the bilateral upper and lower lobes at the segmental and subsegmental levels. No  right heart strain. No definite pulmonary infarction. 2. Right apical 3.2 cm ground-glass airspace opacity versus streak artifact in the setting of adjacent intravenous contrast. Subjacent peripheral 0.8 cm vague ground-glass airspace opacity. Finding could represent infection/inflammation versus developing pulmonary infarction versus artifact versus less likely adenocarcinoma. Recommend short-term follow-up with cross-sectional imaging as this finding is not visualized on the radiograph. 3. Bilateral trace pleural effusions. 4. Trace pericardial effusion. These results were called by telephone at the time of interpretation on 02/08/2021 at 6:00 am to provider Dr. April Manson, who verbally acknowledged these results. Electronically Signed   By: Tish Frederickson M.D.   On: 02/08/2021 06:04   ECHOCARDIOGRAM COMPLETE  Result Date: 02/08/2021    ECHOCARDIOGRAM REPORT   Patient Name:   SERAFINO BURCIAGA Date of Exam: 02/08/2021 Medical Rec #:  [id number]     Height:       77.0 in Accession #:    [id number]    Weight:       304.2 lb Date of Birth:  03-14-2001     BSA:  2.673 m Patient Age:    19 years      BP:           125/82 mmHg Patient Gender: M             HR:           91 bpm. Exam Location:  Inpatient Procedure: 2D Echo, Cardiac Doppler and Color Doppler Indications:    I26.02 Pulmonary embolus; Elevated Troponin  History:        Patient has no prior history of Echocardiogram examinations.                 Risk Factors:Diabetes.  Sonographer:    Elmarie Shileyiffany Dance Referring Phys: [id number] Oliver PilaAROLE N HALL  Sonographer Comments: No subcostal window. IMPRESSIONS  1. Left ventricular ejection fraction, by estimation, is 55 to 60%. The left ventricle has normal function. The left ventricle has no regional wall motion abnormalities. There is mild left ventricular hypertrophy of the basal-septal segment. Left ventricular diastolic parameters were normal.  2. Right ventricular systolic function is normal. The right ventricular  size is normal.  3. A small pericardial effusion is present. The pericardial effusion is posterior to the left ventricle.  4. The mitral valve is normal in structure. No evidence of mitral valve regurgitation. No evidence of mitral stenosis.  5. The aortic valve is tricuspid. Aortic valve regurgitation is not visualized. No aortic stenosis is present.  6. The inferior vena cava is normal in size with greater than 50% respiratory variability, suggesting right atrial pressure of 3 mmHg. FINDINGS  Left Ventricle: Left ventricular ejection fraction, by estimation, is 55 to 60%. The left ventricle has normal function. The left ventricle has no regional wall motion abnormalities. The left ventricular internal cavity size was normal in size. There is  mild left ventricular hypertrophy of the basal-septal segment. Left ventricular diastolic parameters were normal. Normal left ventricular filling pressure. Right Ventricle: The right ventricular size is normal. No increase in right ventricular wall thickness. Right ventricular systolic function is normal. Left Atrium: Left atrial size was normal in size. Right Atrium: Right atrial size was normal in size. Pericardium: A small pericardial effusion is present. The pericardial effusion is posterior to the left ventricle. Mitral Valve: The mitral valve is normal in structure. No evidence of mitral valve regurgitation. No evidence of mitral valve stenosis. Tricuspid Valve: The tricuspid valve is normal in structure. Tricuspid valve regurgitation is trivial. No evidence of tricuspid stenosis. Aortic Valve: The aortic valve is tricuspid. Aortic valve regurgitation is not visualized. No aortic stenosis is present. Pulmonic Valve: The pulmonic valve was normal in structure. Pulmonic valve regurgitation is not visualized. No evidence of pulmonic stenosis. Aorta: The aortic root is normal in size and structure. Venous: The inferior vena cava is normal in size with greater than 50%  respiratory variability, suggesting right atrial pressure of 3 mmHg. IAS/Shunts: No atrial level shunt detected by color flow Doppler.  LEFT VENTRICLE PLAX 2D LVIDd:         4.60 cm  Diastology LVIDs:         3.00 cm  LV e' medial:    9.82 cm/s LV PW:         1.00 cm  LV E/e' medial:  6.7 LV IVS:        1.34 cm  LV e' lateral:   13.90 cm/s LVOT diam:     2.40 cm  LV E/e' lateral: 4.8 LV SV:  62 LV SV Index:   23 LVOT Area:     4.52 cm  RIGHT VENTRICLE RV Basal diam:  2.60 cm RV S prime:     11.20 cm/s TAPSE (M-mode): 1.7 cm LEFT ATRIUM             Index       RIGHT ATRIUM           Index LA diam:        2.60 cm 0.97 cm/m  RA Area:     11.80 cm LA Vol (A2C):   40.6 ml 15.19 ml/m RA Volume:   25.40 ml  9.50 ml/m LA Vol (A4C):   37.5 ml 14.03 ml/m LA Biplane Vol: 39.1 ml 14.63 ml/m  AORTIC VALVE LVOT Vmax:   85.90 cm/s LVOT Vmean:  55.200 cm/s LVOT VTI:    0.137 m  AORTA Ao Root diam: 3.00 cm Ao Asc diam:  2.30 cm MITRAL VALVE MV Area (PHT): 3.88 cm    SHUNTS MV Decel Time: 196 msec    Systemic VTI:  0.14 m MV E velocity: 66.20 cm/s  Systemic Diam: 2.40 cm MV A velocity: 45.85 cm/s MV E/A ratio:  1.44 Chilton Si MD Electronically signed by Chilton Si MD Signature Date/Time: 02/08/2021/2:42:56 PM    Final    VAS [place] LOWER EXTREMITY VENOUS (DVT)  Result Date: 02/09/2021  Lower Venous DVT Study Patient Name:  BRANNEN KOPPEN  Date of Exam:   02/08/2021 Medical Rec #: [id number]      Accession #:    [id number] Date of Birth: [birth year]      Patient Gender: M Patient Age:   19Y Exam Location:  [facility] Procedure:      VAS [place] LOWER EXTREMITY VENOUS (DVT) Referring Phys: [id number] CAROLE N HALL --------------------------------------------------------------------------------  Indications: Pulmonary embolism.  Comparison Study: no prior Performing Technologist: [place] Ponder RVS  Examination Guidelines: A complete evaluation includes B-mode imaging, spectral Doppler, color Doppler, and power  Doppler as needed of all accessible portions of each vessel. Bilateral testing is considered an integral part of a complete examination. Limited examinations for reoccurring indications may be performed as noted. The reflux portion of the exam is performed with the patient in reverse Trendelenburg.  +---------+---------------+---------+-----------+----------+--------------+ RIGHT    CompressibilityPhasicitySpontaneityPropertiesThrombus Aging +---------+---------------+---------+-----------+----------+--------------+ CFV      Full           Yes      Yes                                 +---------+---------------+---------+-----------+----------+--------------+ SFJ      Full                                                        +---------+---------------+---------+-----------+----------+--------------+ FV Prox  Full                                                        +---------+---------------+---------+-----------+----------+--------------+ FV Mid   Full                                                        +---------+---------------+---------+-----------+----------+--------------+  FV DistalFull                                                        +---------+---------------+---------+-----------+----------+--------------+ PFV      Full                                                        +---------+---------------+---------+-----------+----------+--------------+ POP      Full           Yes      Yes                                 +---------+---------------+---------+-----------+----------+--------------+ PTV      Full                                                        +---------+---------------+---------+-----------+----------+--------------+ PERO     Full                                                        +---------+---------------+---------+-----------+----------+--------------+    +---------+---------------+---------+-----------+----------+--------------+ LEFT     CompressibilityPhasicitySpontaneityPropertiesThrombus Aging +---------+---------------+---------+-----------+----------+--------------+ CFV      Full           Yes      Yes                                 +---------+---------------+---------+-----------+----------+--------------+ SFJ      Full                                                        +---------+---------------+---------+-----------+----------+--------------+ FV Prox  Full                                                        +---------+---------------+---------+-----------+----------+--------------+ FV Mid   Full                                                        +---------+---------------+---------+-----------+----------+--------------+ FV DistalFull                                                        +---------+---------------+---------+-----------+----------+--------------+  PFV      Full                                                        +---------+---------------+---------+-----------+----------+--------------+ POP      Full           Yes      Yes                                 +---------+---------------+---------+-----------+----------+--------------+ PTV      Full                                                        +---------+---------------+---------+-----------+----------+--------------+ PERO     Full                                                        +---------+---------------+---------+-----------+----------+--------------+     Summary: BILATERAL: - No evidence of deep vein thrombosis seen in the lower extremities, bilaterally. -No evidence of popliteal cyst, bilaterally.   *See table(s) above for measurements and observations. Electronically signed by Heath Lark on 02/09/2021 at 12:33:22 PM.    Final      Scheduled Meds:  insulin aspart  0-15 Units Subcutaneous  TID WC   insulin aspart  0-5 Units Subcutaneous QHS   rivaroxaban  15 mg Oral BID   Followed by   Melene Muller ON 03/02/2021] rivaroxaban  20 mg Oral Daily   sodium chloride flush  3 mL Intravenous Q12H   Continuous Infusions:   LOS: 1 day     Darlin Priestly, MD [facility] If 7PM-7AM, please contact night-coverage 02/10/2021, 12:07 AM

## 2021-02-09 NOTE — Progress Notes (Signed)
ANTICOAGULATION CONSULT NOTE - Follow Up Consult  Pharmacy Consult for Heparin>>Xarelto Indication: pulmonary embolus  No Known Allergies  Patient Measurements: Height: 6\' 5"  (195.6 cm) IBW/kg (Calculated) : 89.1  Vital Signs: Temp: 98.3 F (36.8 C) (06/23 0559) Temp Source: Oral (06/23 0559) BP: 131/75 (06/23 0559) Pulse Rate: 73 (06/23 0559)  Labs: Recent Labs    02/07/21 1912 02/07/21 2241 02/08/21 0640 02/08/21 1325 02/08/21 2155 02/09/21 0221 02/09/21 0920  HGB 16.1  --  15.3  --   --  14.7  --   HCT 47.7  --  45.0  --   --  43.2  --   PLT 200  --  185  --   --  188  --   HEPARINUNFRC  --   --   --  0.27* 0.36  --  0.15*  CREATININE 1.28*  --  1.17  --   --  1.26*  --   TROPONINIHS 30* 32*  --   --   --   --   --     Estimated Creatinine Clearance: 146.2 mL/min (A) (by C-G formula based on SCr of 1.26 mg/dL (H)).   Assessment:  Anticoag: hep gtt for PE, no right heart strain.  - 6/22: Dopplers negative - 6/23: HL 0.15 low. Hgb 14.7,, Plts 188>>convert to Xarelto  Goal of Therapy:  Therapeutic oral anticoagulation  Plan:  D/c IV heparin Xarelto 15mg  BID x 21d then 20mg  daily Recheck BMET and Mg in AM   Mionna Advincula S. 7/23, PharmD, BCPS Clinical Staff Pharmacist [email or web address]  , Costella Schwarz Stillinger 02/09/2021,11:04 AM

## 2021-02-09 NOTE — Progress Notes (Signed)
Brief Nutrition Note  Attempted to call patient via phone for diet education on uncontrolled T2DM.  Pt answered phone but politely deferred education for another time when he is not in pain. Will attempt follow up at a later time.  RD attached "Carbohydrate Counting for Diabetes" handout from the Academy of [employer] Dietetics to pt's discharge summary.  If need arises, please re-consult RD.  Vertell Limber, RD, LDN (she/her/hers) Registered Dietitian I After-Hours/Weekend Pager # in [place]

## 2021-02-09 NOTE — TOC Benefit Eligibility Note (Signed)
Patient Product/process development scientist completed.    The patient is currently admitted and upon discharge could be taking Xarelto 20 mg.  The current 30 day co-pay is, $0.00.   The patient is insured through [employer] [place]    Roland Earl, CPhT Pharmacy Patient Advocate Specialist [facility] Antimicrobial Stewardship Team Direct Number: [phone]  Fax: [phone]

## 2021-02-09 NOTE — Discharge Instructions (Addendum)
Carbohydrate Counting For People With Diabetes  Foods with carbohydrates make your blood glucose level go up. Learning how to count carbohydrates can help you control your blood glucose levels. First, identify the foods you eat that contain carbohydrates. Then, using the Foods with Carbohydrates chart, determine about how much carbohydrates are in your meals and snacks. Make sure you are eating foods with fiber, protein, and healthy fat along with your carbohydrate foods. Foods with Carbohydrates The following table shows carbohydrate foods that have about 15 grams of carbohydrate each. Using measuring cups, spoons, or a food scale when you first begin learning about carbohydrate counting can help you learn about the portion sizes you typically eat. The following foods have 15 grams carbohydrate each:  Grains 1 slice bread (1 ounce)  1 small tortilla (6-inch size)   large bagel (1 ounce)  1/3 cup pasta or rice (cooked)   hamburger or hot dog bun ( ounce)   cup cooked cereal   to  cup ready-to-eat cereal  2 taco shells (5-inch size) Fruit 1 small fresh fruit ( to 1 cup)   medium banana  17 small grapes (3 ounces)  1 cup melon or berries   cup canned or frozen fruit  2 tablespoons dried fruit (blueberries, cherries, cranberries, raisins)   cup unsweetened fruit juice  Starchy Vegetables  cup cooked beans, peas, corn, potatoes/sweet potatoes   large baked potato (3 ounces)  1 cup acorn or butternut squash  Snack Foods 3 to 6 crackers  8 potato chips or 13 tortilla chips ( ounce to 1 ounce)  3 cups popped popcorn  Dairy 3/4 cup (6 ounces) nonfat plain yogurt, or yogurt with sugar-free sweetener  1 cup milk  1 cup plain rice, soy, coconut or flavored almond milk Sweets and Desserts  cup ice cream or frozen yogurt  1 tablespoon jam, jelly, pancake syrup, table sugar, or honey  2 tablespoons light pancake syrup  1 inch square of frosted cake or 2 inch square of unfrosted  cake  2 small cookies (2/3 ounce each) or  large cookie  Sometimes you'll have to estimate carbohydrate amounts if you don't know the exact recipe. One cup of mixed foods like soups can have 1 to 2 carbohydrate servings, while some casseroles might have 2 or more servings of carbohydrate. Foods that have less than 20 calories in each serving can be counted as "free" foods. Count 1 cup raw vegetables, or  cup cooked non-starchy vegetables as "free" foods. If you eat 3 or more servings at one meal, then count them as 1 carbohydrate serving.  Foods without Carbohydrates  Not all foods contain carbohydrates. Meat, some dairy, fats, non-starchy vegetables, and many beverages don't contain carbohydrate. So when you count carbohydrates, you can generally exclude chicken, pork, beef, fish, seafood, eggs, tofu, cheese, butter, sour cream, avocado, nuts, seeds, olives, mayonnaise, water, black coffee, unsweetened tea, and zero-calorie drinks. Vegetables with no or low carbohydrate include green beans, cauliflower, tomatoes, and onions. How much carbohydrate should I eat at each meal?  Carbohydrate counting can help you plan your meals and manage your weight. Following are some starting points for carbohydrate intake at each meal. Work with your registered dietitian nutritionist to find the best range that works for your blood glucose and weight.   To Lose Weight To Maintain Weight  Women 2 - 3 carb servings 3 - 4 carb servings  Men 3 - 4 carb servings 4 - 5 carb servings  Checking your   blood glucose after meals will help you know if you need to adjust the timing, type, or number of carbohydrate servings in your meal plan. Achieve and keep a healthy body weight by balancing your food intake and physical activity.  Tips How should I plan my meals?  Plan for half the food on your plate to include non-starchy vegetables, like salad greens, broccoli, or carrots. Try to eat 3 to 5 servings of non-starchy vegetables  every day. Have a protein food at each meal. Protein foods include chicken, fish, meat, eggs, or beans (note that beans contain carbohydrate). These two food groups (non-starchy vegetables and proteins) are low in carbohydrate. If you fill up your plate with these foods, you will eat less carbohydrate but still fill up your stomach. Try to limit your carbohydrate portion to  of the plate.  What fats are healthiest to eat?  Diabetes increases risk for heart disease. To help protect your heart, eat more healthy fats, such as olive oil, nuts, and avocado. Eat less saturated fats like butter, cream, and high-fat meats, like bacon and sausage. Avoid trans fats, which are in all foods that list "partially hydrogenated oil" as an ingredient. What should I drink?  Choose drinks that are not sweetened with sugar. The healthiest choices are water, carbonated or seltzer waters, and tea and coffee without added sugars.  Sweet drinks will make your blood glucose go up very quickly. One serving of soda or energy drink is  cup. It is best to drink these beverages only if your blood glucose is low.  Artificially sweetened, or diet drinks, typically do not increase your blood glucose if they have zero calories in them. Read labels of beverages, as some diet drinks do have carbohydrate and will raise your blood glucose. Label Reading Tips Read Nutrition Facts labels to find out how many grams of carbohydrate are in a food you want to eat. Don't forget: sometimes serving sizes on the label aren't the same as how much food you are going to eat, so you may need to calculate how much carbohydrate is in the food you are serving yourself.   Carbohydrate Counting for People with Diabetes Sample 1-Day Menu  Breakfast  cup yogurt, low fat, low sugar (1 carbohydrate serving)   cup cereal, ready-to-eat, unsweetened (1 carbohydrate serving)  1 cup strawberries (1 carbohydrate serving)   cup almonds ( carbohydrate serving)   Lunch 1, 5 ounce can chunk light tuna  2 ounces cheese, low fat cheddar  6 whole wheat crackers (1 carbohydrate serving)  1 small apple (1 carbohydrate servings)   cup carrots ( carbohydrate serving)   cup snap peas  1 cup 1% milk (1 carbohydrate serving)   Evening Meal Stir fry made with: 3 ounces chicken  1 cup brown rice (3 carbohydrate servings)   cup broccoli ( carbohydrate serving)   cup green beans   cup onions  1 tablespoon olive oil  2 tablespoons teriyaki sauce ( carbohydrate serving)  Evening Snack 1 extra small banana (1 carbohydrate serving)  1 tablespoon peanut butter   Carbohydrate Counting for People with Diabetes Vegan Sample 1-Day Menu  Breakfast 1 cup cooked oatmeal (2 carbohydrate servings)   cup blueberries (1 carbohydrate serving)  2 tablespoons flaxseeds  1 cup soymilk fortified with calcium and vitamin D  1 cup coffee  Lunch 2 slices whole wheat bread (2 carbohydrate servings)   cup baked tofu   cup lettuce  2 slices tomato  2 slices avocado     cup baby carrots ( carbohydrate serving)  1 orange (1 carbohydrate serving)  1 cup soymilk fortified with calcium and vitamin D   Evening Meal Burrito made with: 1 6-inch corn tortilla (1 carbohydrate serving)  1 cup refried vegetarian beans (2 carbohydrate servings)   cup chopped tomatoes   cup lettuce   cup salsa  1/3 cup brown rice (1 carbohydrate serving)  1 tablespoon olive oil for rice   cup zucchini   Evening Snack 6 small whole grain crackers (1 carbohydrate serving)  2 apricots ( carbohydrate serving)   cup unsalted peanuts ( carbohydrate serving)    Carbohydrate Counting for People with Diabetes Vegetarian (Lacto-Ovo) Sample 1-Day Menu  Breakfast 1 cup cooked oatmeal (2 carbohydrate servings)   cup blueberries (1 carbohydrate serving)  2 tablespoons flaxseeds  1 egg  1 cup 1% milk (1 carbohydrate serving)  1 cup coffee  Lunch 2 slices whole wheat bread (2 carbohydrate  servings)  2 ounces low-fat cheese   cup lettuce  2 slices tomato  2 slices avocado   cup baby carrots ( carbohydrate serving)  1 orange (1 carbohydrate serving)  1 cup unsweetened tea  Evening Meal Burrito made with: 1 6-inch corn tortilla (1 carbohydrate serving)   cup refried vegetarian beans (1 carbohydrate serving)   cup tomatoes   cup lettuce   cup salsa  1/3 cup brown rice (1 carbohydrate serving)  1 tablespoon olive oil for rice   cup zucchini  1 cup 1% milk (1 carbohydrate serving)  Evening Snack 6 small whole grain crackers (1 carbohydrate serving)  2 apricots ( carbohydrate serving)   cup unsalted peanuts ( carbohydrate serving)    Copyright 2020  [employer]. All rights reserved.  Using Nutrition Labels: Carbohydrate  Serving Size  Look at the serving size. All the information on the label is based on this portion. Servings Per Container  The number of servings contained in the package. Guidelines for Carbohydrate  Look at the total grams of carbohydrate in the serving size.  1 carbohydrate choice = 15 grams of carbohydrate. Range of Carbohydrate Grams Per Choice  Carbohydrate Grams/Choice Carbohydrate Choices  6-10   11-20 1  21-25 1  26-35 2  36-40 2  41-50 3  51-55 3  56-65 4  66-70 4  71-80 5    Copyright 2020  [employer]. All rights reserved.     Information on my medicine - XARELTO (rivaroxaban)   WHY WAS XARELTO PRESCRIBED FOR YOU? 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  What do you need to know about Xarelto? The starting dose is one 15 mg tablet taken TWICE daily with food for the FIRST 21 DAYS then on (enter date)  03/02/2021  the dose is changed to one 20 mg tablet taken ONCE A DAY with your evening  meal.  DO NOT stop taking Xarelto without talking to the health care provider who prescribed the medication.  Refill your prescription for 20 mg tablets before you run out.  After discharge, you should have regular check-up appointments with your healthcare provider that is prescribing your Xarelto.  In the future your dose may need to be changed if your kidney function changes by a significant amount.  What do you do if you miss a dose? If  you are taking Xarelto TWICE DAILY and you miss a dose, take it as soon as you remember. You may take two 15 mg tablets (total 30 mg) at the same time then resume your regularly scheduled 15 mg twice daily the next day.  If you are taking Xarelto ONCE DAILY and you miss a dose, take it as soon as you remember on the same day then continue your regularly scheduled once daily regimen the next day. Do not take two doses of Xarelto at the same time.   Important Safety Information Xarelto is a blood thinner medicine that can cause bleeding. You should call your healthcare provider right away if you experience any of the following: Bleeding from an injury or your nose that does not stop. Unusual colored urine (red or dark brown) or unusual colored stools (red or black). Unusual bruising for unknown reasons. A serious fall or if you hit your head (even if there is no bleeding).  Some medicines may interact with Xarelto and might increase your risk of bleeding while on Xarelto. To help avoid this, consult your healthcare provider or pharmacist prior to using any new prescription or non-prescription medications, including herbals, vitamins, non-steroidal anti-inflammatory drugs (NSAIDs) and supplements.  This website has more information on Xarelto: [email or web address].

## 2021-02-09 NOTE — Plan of Care (Signed)
  Problem: Education: Goal: Knowledge of General Education information will improve Description: Including pain rating scale, medication(s)/side effects and non-pharmacologic comfort measures Outcome: Progressing   Problem: Health Behavior/Discharge Planning: Goal: Ability to manage health-related needs will improve Outcome: Progressing   Problem: Clinical Measurements: Goal: Ability to maintain clinical measurements within normal limits will improve Outcome: Progressing Goal: Will remain free from infection Outcome: Progressing Goal: Diagnostic test results will improve Outcome: Progressing Goal: Respiratory complications will improve Outcome: Progressing Goal: Cardiovascular complication will be avoided Outcome: Progressing   Problem: Activity: Goal: Risk for activity intolerance will decrease Outcome: Progressing   Problem: Nutrition: Goal: Adequate nutrition will be maintained Outcome: Progressing   Problem: Coping: Goal: Level of anxiety will decrease Outcome: Progressing   Problem: Elimination: Goal: Will not experience complications related to bowel motility Outcome: Progressing Goal: Will not experience complications related to urinary retention Outcome: Progressing   Problem: Pain Managment: Goal: General experience of comfort will improve Outcome: Progressing   Problem: Safety: Goal: Ability to remain free from injury will improve Outcome: Progressing   Problem: Skin Integrity: Goal: Risk for impaired skin integrity will decrease Outcome: Progressing   Problem: Education: Goal: Ability to describe self-care measures that may prevent or decrease complications (Diabetes Survival Skills Education) will improve Outcome: Progressing Goal: Individualized Educational Video(s) Outcome: Progressing   Problem: Coping: Goal: Ability to adjust to condition or change in health will improve Outcome: Progressing   Problem: Fluid Volume: Goal: Ability to  maintain a balanced intake and output will improve Outcome: Progressing   Problem: Health Behavior/Discharge Planning: Goal: Ability to identify and utilize available resources and services will improve Outcome: Progressing Goal: Ability to manage health-related needs will improve Outcome: Progressing   Problem: Metabolic: Goal: Ability to maintain appropriate glucose levels will improve Outcome: Progressing   Problem: Nutritional: Goal: Maintenance of adequate nutrition will improve Outcome: Progressing Goal: Progress toward achieving an optimal weight will improve Outcome: Progressing   Problem: Skin Integrity: Goal: Risk for impaired skin integrity will decrease Outcome: Progressing   Problem: Tissue Perfusion: Goal: Adequacy of tissue perfusion will improve Outcome: Progressing   Problem: Consults Goal: Venous Thromboembolism Patient Education Description: See Patient Education Module for education specifics. Outcome: Progressing Goal: Diagnosis - Venous Thromboembolism (VTE) Description: Choose a selection Outcome: Progressing Goal: Pharmacy Consult for anticoagulation Outcome: Progressing Goal: Skin Care Protocol Initiated - if Braden Score 18 or less Description: If consults are not indicated, leave blank or document N/A Outcome: Progressing Goal: Nutrition Consult-if indicated Outcome: Progressing Goal: Diabetes Guidelines if Diabetic/Glucose > 140 Description: If diabetic or lab glucose is > 140 mg/dl - Initiate Diabetes/Hyperglycemia Guidelines & Document Interventions  Outcome: Progressing   Problem: Phase I Progression Outcomes Goal: Pain controlled with appropriate interventions Outcome: Progressing Goal: Dyspnea controlled at rest (PE) Outcome: Progressing Goal: Tolerating diet Outcome: Progressing   Problem: Phase II Progression Outcomes Goal: Therapeutic drug levels for anticoagulation Outcome: Progressing Goal: 02 sats trending upward/stable  (PE) Outcome: Progressing Goal: Discharge plan established Outcome: Progressing

## 2021-02-10 ENCOUNTER — Other Ambulatory Visit (HOSPITAL_COMMUNITY): Payer: Self-pay

## 2021-02-10 LAB — GLUCOSE, CAPILLARY
Glucose-Capillary: 176 mg/dL — ABNORMAL HIGH (ref 70–99)
Glucose-Capillary: 188 mg/dL — ABNORMAL HIGH (ref 70–99)

## 2021-02-10 LAB — BASIC METABOLIC PANEL
Anion gap: 11 (ref 5–15)
BUN: 7 mg/dL (ref 6–20)
CO2: 28 mmol/L (ref 22–32)
Calcium: 9.7 mg/dL (ref 8.9–10.3)
Chloride: 98 mmol/L (ref 98–111)
Creatinine, Ser: 1.26 mg/dL — ABNORMAL HIGH (ref 0.61–1.24)
GFR, Estimated: >60 mL/min (ref >60)
Glucose, Bld: 138 mg/dL — ABNORMAL HIGH (ref 70–99)
Potassium: 3.5 mmol/L (ref 3.5–5.1)
Sodium: 137 mmol/L (ref 135–145)

## 2021-02-10 LAB — CBC
HCT: 48.3 % (ref 39.0–52.0)
Hemoglobin: 16.3 g/dL (ref 13.0–17.0)
MCH: 27.5 pg (ref 26.0–34.0)
MCHC: 33.7 g/dL (ref 30.0–36.0)
MCV: 81.5 fL (ref 80.0–100.0)
Platelets: 217 10*3/uL (ref 150–400)
RBC: 5.93 MIL/uL — ABNORMAL HIGH (ref 4.22–5.81)
RDW: 11.9 % (ref 11.5–15.5)
WBC: 8.2 10*3/uL (ref 4.0–10.5)
nRBC: 0 % (ref 0.0–0.2)

## 2021-02-10 LAB — MAGNESIUM: Magnesium: 1.9 mg/dL (ref 1.7–2.4)

## 2021-02-10 MED ORDER — METFORMIN HCL 1000 MG PO TABS
1000.0000 mg | ORAL_TABLET | Freq: Two times a day (BID) | ORAL | 0 refills | Status: DC
  Filled 2021-02-10: qty 180, 90d supply, fill #0

## 2021-02-10 MED ORDER — GLIPIZIDE 10 MG PO TABS
10.0000 mg | ORAL_TABLET | Freq: Two times a day (BID) | ORAL | 0 refills | Status: DC
  Filled 2021-02-10: qty 180, 90d supply, fill #0

## 2021-02-10 NOTE — Discharge Summary (Signed)
Physician Discharge Summary   Bruce [facility]  male DOB: Sep 02, 2000  ZOX:[id number]  PCP: Patient, No Pcp Per (Inactive)  Admit date: 02/07/2021 Discharge date: 02/10/2021  Admitted From: home Disposition:  home Father updated at bedside prior to discharge.  CODE STATUS: Full code  Discharge Instructions     Ambulatory referral to Hematology / Oncology   Complete by: As directed    Discharge instructions   Complete by: As directed    We have started you on Xarelto for the blood clots in your lungs.  Please take the starter pack as directed.  You will need to follow up with outpatient PCP to continue the prescription, and take it for at least 3 months.  You will also need outpatient hematology to evaluate you for why you have developed blood clots.  You have uncontrolled diabetes now.  I have re-filled your Metformin and glipizide.  Please take them as directed, and follow up with outpatient PCP for further blood glucose control.   Dr. Darlin Priestly [facility] Course:  For full details, please see H&P, progress notes, consult notes and ancillary notes.  Briefly,  Bruce Walters is a 20 y.o. male with medical history significant of DM, obesity, and asthma presenting with CP/SOB found to have bilateral PE.   Bilateral PE -Patient without prior episodes of thromboembolic disease presenting with new PE -No FH to suggest clotting disorder -Recent prolonged car trip, however, neg for DVT in BLE. --started on IV heparin on admission --pulm consulted on admission. --Pt was transitioned to Xarelto prior to discharge.  Pt and father advised to follow up with outpatient PCP to continue the Xarelto Rx. --outpatient hematologist referral   Atypical pulmonary nodule right upper lobe, undetermined significance --appeared inflammatory or possibly pulmonary infarct --outpatient followup with [facility]    DM, poorly controlled -He was seen at [facility] and then ED on 10/15/20  and found to have glucose in the 300-600s -He reports general compliance with metformin, has not been taking glipizide -He has been unable to f/u with a PCP due to insurance reasons -Modestly elevated glucose, A1c is 10.6 indicating poor control --Pt was discharged on Metformin and glipizide. --will establish with PCP now that pt has MediCaid   Obesity -BMI 36.08 --weight loss encouraged.   Hypomag --replete with IV mag PRN   Abdominal pain --diffuse, non-specific and present for months.  Normal oral intake and BM. --may be due to uncontrolled DM. --Monitor for now   Discharge Diagnoses:  Principal Problem:   Pulmonary embolism, bilateral (HCC) Active Problems:   Class 2 severe obesity due to excess calories with serious comorbidity and body mass index (BMI) of 36.0 to 36.9 in adult Bruce [facility])   Diabetes mellitus without complication (HCC)   Pulmonary embolus (HCC)   30 Day Unplanned Readmission Risk Score    Flowsheet Row ED to Hosp-Admission (Current) from 02/07/2021 in [facility]  30 Day Unplanned Readmission Risk Score (%) 13.1 Filed at 02/10/2021 0801       This score is the patient's risk of an unplanned readmission within 30 days of being discharged (0 -100%). The score is based on dignosis, age, lab data, medications, orders, and past utilization.   Low:  0-14.9   Medium: 15-21.9   High: 22-29.9   Extreme: 30 and above          Discharge Instructions:  Allergies as of 02/10/2021   No Known  Allergies      Medication List     TAKE these medications    glipiZIDE 10 MG tablet Commonly known as: GLUCOTROL Take 1 tablet (10 mg total) by mouth 2 (two) times daily before a meal. What changed:  medication strength how much to take   metFORMIN 1000 MG tablet Commonly known as: GLUCOPHAGE Take 1 tablet (1,000 mg total) by mouth 2 (two) times daily with a meal.   ondansetron 8 MG disintegrating tablet Commonly known as:  ZOFRAN-ODT Take 1 tablet (8 mg total) by mouth every 8 (eight) hours as needed for nausea or vomiting.   Xarelto Starter Pack Generic drug: Rivaroxaban Stater Pack (15 mg and 20 mg) Follow package directions: Take one 15mg  tablet by mouth twice a day. On day 22, switch to one 20mg  tablet once a day. Take with food.         Follow-up Information     [facility] Schedule an appointment as soon as possible for a visit in 1 week(s).   Why: make post [facility] appointment and establish primary care Contact information: [address]                No Known Allergies   The results of significant diagnostics from this hospitalization (including imaging, microbiology, ancillary and laboratory) are listed below for reference.   Consultations:   Procedures/Studies: DG Chest 2 View  Result Date: 02/07/2021 CLINICAL DATA:  Shortness of breath and chest pain. EXAM: CHEST - 2 VIEW COMPARISON:  02/05/2021 FINDINGS: Low volume film without focal airspace consolidation or pleural effusion. No pneumothorax or evidence of pulmonary edema. The cardiopericardial silhouette is within normal limits for size. The visualized bony structures of the thorax show no acute abnormality. IMPRESSION: Low volume film without acute cardiopulmonary findings. Electronically Signed   By: 02/09/2021 M.D.   On: 02/07/2021 20:29   DG Chest 2 View  Result Date: 02/05/2021 CLINICAL DATA:  Chest pain. EXAM: CHEST - 2 VIEW COMPARISON:  Radiograph 2 days ago FINDINGS: Low lung volumes.The cardiomediastinal contours are normal. Pulmonary vasculature is normal. No consolidation, pleural effusion, or pneumothorax. No acute osseous abnormalities are seen. IMPRESSION: Low lung volumes.  No acute findings. Electronically Signed   By: 02/09/2021 M.D.   On: 02/05/2021 19:45   DG Chest 2 View  Result Date: 02/03/2021 CLINICAL DATA:  Chest pain  EXAM: CHEST - 2 VIEW COMPARISON:  09/22/2020 FINDINGS: The heart size and mediastinal contours are within normal limits. Both lungs are clear. Mild scoliosis. IMPRESSION: No active cardiopulmonary disease. Electronically Signed   By: 02/05/2021 M.D.   On: 02/03/2021 19:16   CT Angio Chest PE W and/or Wo Contrast  Result Date: 02/08/2021 CLINICAL DATA:  Chest pain and shortness of breath for 1 week. Worse with exertion. Ulcer with abdominal pain. EXAM: CT ANGIOGRAPHY CHEST WITH CONTRAST TECHNIQUE: Multidetector CT imaging of the chest was performed using the standard protocol during bolus administration of intravenous contrast. Multiplanar CT image reconstructions and MIPs were obtained to evaluate the vascular anatomy. CONTRAST:  72mL OMNIPAQUE IOHEXOL 350 MG/ML SOLN COMPARISON:  Chest x-ray 09/22/2020, chest x-ray 02/03/2021, chest x-ray 02/05/2021. FINDINGS: Cardiovascular: Satisfactory opacification of the pulmonary arteries to the segmental level. Bilateral central pulmonary emboli that extend to bilateral lower and upper lobes at the segmental and subsegmental levels. Normal heart size. Trace pericardial effusion. Thoracic aorta is normal in caliber. Mediastinum/Nodes: No enlarged mediastinal, hilar, or axillary  lymph nodes. Thyroid gland, trachea, and esophagus demonstrate no significant findings. Lungs/Pleura: Low lung volumes. Bibasilar atelectasis. Right apical ground-glass airspace opacity measuring up to 3.2 cm with limited evaluation due to streak artifact originating from intravenous contrast. Subjacent there is a peripheral vague ground-glass airspace opacity within right upper lobe measuring up to 8 mm (11: 64-65). Bilateral trace pleural effusions. No pneumothorax peer Upper Abdomen: No acute abnormality. Musculoskeletal: No chest wall abnormality. No suspicious lytic or blastic osseous lesions. No acute displaced fracture. Review of the MIP images confirms the above findings. IMPRESSION: 1.  Bilateral central pulmonary emboli extending to the bilateral upper and lower lobes at the segmental and subsegmental levels. No right heart strain. No definite pulmonary infarction. 2. Right apical 3.2 cm ground-glass airspace opacity versus streak artifact in the setting of adjacent intravenous contrast. Subjacent peripheral 0.8 cm vague ground-glass airspace opacity. Finding could represent infection/inflammation versus developing pulmonary infarction versus artifact versus less likely adenocarcinoma. Recommend short-term follow-up with cross-sectional imaging as this finding is not visualized on the radiograph. 3. Bilateral trace pleural effusions. 4. Trace pericardial effusion. These results were called by telephone at the time of interpretation on 02/08/2021 at 6:00 am to provider Dr. April Manson, who verbally acknowledged these results. Electronically Signed   By: Tish Frederickson M.D.   On: 02/08/2021 06:04   ECHOCARDIOGRAM COMPLETE  Result Date: 02/08/2021    ECHOCARDIOGRAM REPORT   Patient Name:   RAMEEN QUINNEY Date of Exam: 02/08/2021 Medical Rec #:  [id number]     Height:       77.0 in Accession #:    [id number]    Weight:       304.2 lb Date of Birth:  2000-09-08     BSA:          2.673 m Patient Age:    19 years      BP:           125/82 mmHg Patient Gender: M             HR:           91 bpm. Exam Location:  Inpatient Procedure: 2D Echo, Cardiac Doppler and Color Doppler Indications:    I26.02 Pulmonary embolus; Elevated Troponin  History:        Patient has no prior history of Echocardiogram examinations.                 Risk Factors:Diabetes.  Sonographer:    Elmarie Shiley Dance Referring Phys: [id number] Oliver Pila HALL  Sonographer Comments: No subcostal window. IMPRESSIONS  1. Left ventricular ejection fraction, by estimation, is 55 to 60%. The left ventricle has normal function. The left ventricle has no regional wall motion abnormalities. There is mild left ventricular hypertrophy of the basal-septal  segment. Left ventricular diastolic parameters were normal.  2. Right ventricular systolic function is normal. The right ventricular size is normal.  3. A small pericardial effusion is present. The pericardial effusion is posterior to the left ventricle.  4. The mitral valve is normal in structure. No evidence of mitral valve regurgitation. No evidence of mitral stenosis.  5. The aortic valve is tricuspid. Aortic valve regurgitation is not visualized. No aortic stenosis is present.  6. The inferior vena cava is normal in size with greater than 50% respiratory variability, suggesting right atrial pressure of 3 mmHg. FINDINGS  Left Ventricle: Left ventricular ejection fraction, by estimation, is 55 to 60%. The left ventricle has normal function. The left ventricle has no  regional wall motion abnormalities. The left ventricular internal cavity size was normal in size. There is  mild left ventricular hypertrophy of the basal-septal segment. Left ventricular diastolic parameters were normal. Normal left ventricular filling pressure. Right Ventricle: The right ventricular size is normal. No increase in right ventricular wall thickness. Right ventricular systolic function is normal. Left Atrium: Left atrial size was normal in size. Right Atrium: Right atrial size was normal in size. Pericardium: A small pericardial effusion is present. The pericardial effusion is posterior to the left ventricle. Mitral Valve: The mitral valve is normal in structure. No evidence of mitral valve regurgitation. No evidence of mitral valve stenosis. Tricuspid Valve: The tricuspid valve is normal in structure. Tricuspid valve regurgitation is trivial. No evidence of tricuspid stenosis. Aortic Valve: The aortic valve is tricuspid. Aortic valve regurgitation is not visualized. No aortic stenosis is present. Pulmonic Valve: The pulmonic valve was normal in structure. Pulmonic valve regurgitation is not visualized. No evidence of pulmonic stenosis.  Aorta: The aortic root is normal in size and structure. Venous: The inferior vena cava is normal in size with greater than 50% respiratory variability, suggesting right atrial pressure of 3 mmHg. IAS/Shunts: No atrial level shunt detected by color flow Doppler.  LEFT VENTRICLE PLAX 2D LVIDd:         4.60 cm  Diastology LVIDs:         3.00 cm  LV e' medial:    9.82 cm/s LV PW:         1.00 cm  LV E/e' medial:  6.7 LV IVS:        1.34 cm  LV e' lateral:   13.90 cm/s LVOT diam:     2.40 cm  LV E/e' lateral: 4.8 LV SV:         62 LV SV Index:   23 LVOT Area:     4.52 cm  RIGHT VENTRICLE RV Basal diam:  2.60 cm RV S prime:     11.20 cm/s TAPSE (M-mode): 1.7 cm LEFT ATRIUM             Index       RIGHT ATRIUM           Index LA diam:        2.60 cm 0.97 cm/m  RA Area:     11.80 cm LA Vol (A2C):   40.6 ml 15.19 ml/m RA Volume:   25.40 ml  9.50 ml/m LA Vol (A4C):   37.5 ml 14.03 ml/m LA Biplane Vol: 39.1 ml 14.63 ml/m  AORTIC VALVE LVOT Vmax:   85.90 cm/s LVOT Vmean:  55.200 cm/s LVOT VTI:    0.137 m  AORTA Ao Root diam: 3.00 cm Ao Asc diam:  2.30 cm MITRAL VALVE MV Area (PHT): 3.88 cm    SHUNTS MV Decel Time: 196 msec    Systemic VTI:  0.14 m MV E velocity: 66.20 cm/s  Systemic Diam: 2.40 cm MV A velocity: 45.85 cm/s MV E/A ratio:  1.44 Chilton Siiffany Mathis MD Electronically signed by Chilton Siiffany St. Francois MD Signature Date/Time: 02/08/2021/2:42:56 PM    Final    VAS US LOWER EXTREMITY VENOUS (DVT)  Result Date: 02/09/2021  Lower Venous DVT Study Patient Name:  Irving CopasJULIUS Cogliano  Date of Exam:   02/08/2021 Medical Rec #: [id number]      Accession #:    [id number] Date of Birth: 07/11/2001      Patient Gender: M Patient Age:   19Y [facility]  Procedure:      VAS [place] LOWER EXTREMITY VENOUS (DVT) Referring Phys: [id number] CAROLE N HALL --------------------------------------------------------------------------------  Indications: Pulmonary embolism.  Comparison Study: no prior Performing Technologist: [place] Ponder RVS  Examination Guidelines: A complete evaluation includes B-mode imaging, spectral Doppler, color Doppler, and power Doppler as needed of all accessible portions of each vessel. Bilateral testing is considered an integral part of a complete examination. Limited examinations for reoccurring indications may be performed as noted. The reflux portion of the exam is performed with the patient in reverse Trendelenburg.  +---------+---------------+---------+-----------+----------+--------------+ RIGHT    CompressibilityPhasicitySpontaneityPropertiesThrombus Aging +---------+---------------+---------+-----------+----------+--------------+ CFV      Full           Yes      Yes                                 +---------+---------------+---------+-----------+----------+--------------+ SFJ      Full                                                        +---------+---------------+---------+-----------+----------+--------------+ FV Prox  Full                                                        +---------+---------------+---------+-----------+----------+--------------+ FV Mid   Full                                                        +---------+---------------+---------+-----------+----------+--------------+ FV DistalFull                                                        +---------+---------------+---------+-----------+----------+--------------+ PFV      Full                                                        +---------+---------------+---------+-----------+----------+--------------+ POP      Full           Yes      Yes                                 +---------+---------------+---------+-----------+----------+--------------+ PTV      Full                                                        +---------+---------------+---------+-----------+----------+--------------+ PERO     Full                                                         +---------+---------------+---------+-----------+----------+--------------+   +---------+---------------+---------+-----------+----------+--------------+  LEFT     CompressibilityPhasicitySpontaneityPropertiesThrombus Aging +---------+---------------+---------+-----------+----------+--------------+ CFV      Full           Yes      Yes                                 +---------+---------------+---------+-----------+----------+--------------+ SFJ      Full                                                        +---------+---------------+---------+-----------+----------+--------------+ FV Prox  Full                                                        +---------+---------------+---------+-----------+----------+--------------+ FV Mid   Full                                                        +---------+---------------+---------+-----------+----------+--------------+ FV DistalFull                                                        +---------+---------------+---------+-----------+----------+--------------+ PFV      Full                                                        +---------+---------------+---------+-----------+----------+--------------+ POP      Full           Yes      Yes                                 +---------+---------------+---------+-----------+----------+--------------+ PTV      Full                                                        +---------+---------------+---------+-----------+----------+--------------+ PERO     Full                                                        +---------+---------------+---------+-----------+----------+--------------+     Summary: BILATERAL: - No evidence of deep vein thrombosis seen in the lower extremities, bilaterally. -No evidence of popliteal cyst, bilaterally.   *See table(s) above for measurements and observations. Electronically signed by Heath Lark on 02/09/2021 at 12:33:22 PM.  Final       Labs: BNP (last 3 results) Recent Labs    02/08/21 0623  BNP 18.0   Basic Metabolic Panel: Recent Labs  Lab 02/05/21 1932 02/07/21 1912 02/08/21 0640 02/09/21 0221 02/10/21 0121  NA 136 135 137 137 137  K 3.9 3.8 4.0 3.9 3.5  CL 99 100 101 100 98  CO2 26 24 27 29 28   GLUCOSE 230* 243* 150* 132* 138*  BUN 5* 6 5* 6 7  CREATININE 1.17 1.28* 1.17 1.26* 1.26*  CALCIUM 9.6 9.5 9.2 9.4 9.7  MG  --  1.7 1.6*  --  1.9  PHOS  --   --  4.1  --   --    Liver Function Tests: Recent Labs  Lab 02/03/21 1757 02/05/21 1932  AST 26 26  ALT 41 39  ALKPHOS 51 50  BILITOT 0.7 0.7  PROT 7.0 7.2  ALBUMIN 3.7 3.9   Recent Labs  Lab 02/03/21 1757 02/05/21 1932  LIPASE 34 36   No results for input(s): AMMONIA in the last 168 hours. CBC: Recent Labs  Lab 02/03/21 1757 02/05/21 1932 02/07/21 1912 02/08/21 0640 02/09/21 0221 02/10/21 0121  WBC 8.2 8.1 9.1 11.2* 8.9 8.2  NEUTROABS 5.1  --  5.8  --   --   --   HGB 16.0 16.0 16.1 15.3 14.7 16.3  HCT 46.5 46.6 47.7 45.0 43.2 48.3  MCV 81.9 81.3 82.2 82.1 81.5 81.5  PLT 236 222 200 185 188 217   Cardiac Enzymes: No results for input(s): CKTOTAL, CKMB, CKMBINDEX, TROPONINI in the last 168 hours. BNP: Invalid input(s): POCBNP CBG: Recent Labs  Lab 02/09/21 0733 02/09/21 1115 02/09/21 1608 02/09/21 2013 02/10/21 0752  GLUCAP 140* 149* 163* 153* 176*   D-Dimer No results for input(s): DDIMER in the last 72 hours. Hgb A1c Recent Labs    02/08/21 0639  HGBA1C 10.6*   Lipid Profile No results for input(s): CHOL, HDL, LDLCALC, TRIG, CHOLHDL, LDLDIRECT in the last 72 hours. Thyroid function studies No results for input(s): TSH, T4TOTAL, T3FREE, THYROIDAB in the last 72 hours.  Invalid input(s): FREET3 Anemia work up No results for input(s): VITAMINB12, FOLATE, FERRITIN, TIBC, IRON, RETICCTPCT in the last 72 hours. Urinalysis    Component Value Date/Time   COLORURINE YELLOW 02/03/2021 1757    APPEARANCEUR CLEAR 02/03/2021 1757   LABSPEC 1.018 02/03/2021 1757   PHURINE 6.0 02/03/2021 1757   GLUCOSEU NEGATIVE 02/03/2021 1757   HGBUR NEGATIVE 02/03/2021 1757   BILIRUBINUR NEGATIVE 02/03/2021 1757   KETONESUR NEGATIVE 02/03/2021 1757   PROTEINUR NEGATIVE 02/03/2021 1757   UROBILINOGEN 0.2 09/29/2020 1815   NITRITE NEGATIVE 02/03/2021 1757   LEUKOCYTESUR NEGATIVE 02/03/2021 1757   Sepsis Labs Invalid input(s): PROCALCITONIN,  WBC,  LACTICIDVEN Microbiology Recent Results (from the past 240 hour(s))  Resp Panel by RT-PCR (Flu A&B, Covid) Nasopharyngeal Swab     Status: None   Collection Time: 02/08/21  3:45 AM   Specimen: Nasopharyngeal Swab; Nasopharyngeal(NP) swabs in vial transport medium  Result Value Ref Range Status   SARS Coronavirus 2 by RT PCR NEGATIVE NEGATIVE Final    Comment: (NOTE) SARS-CoV-2 target nucleic acids are NOT DETECTED.  The SARS-CoV-2 RNA is generally detectable in upper respiratory specimens during the acute phase of infection. The lowest concentration of SARS-CoV-2 viral copies this assay can detect is 138 copies/mL. A negative result does not preclude SARS-Cov-2 infection and should not be used as the sole basis for treatment or other patient  management decisions. A negative result may occur with  improper specimen collection/handling, submission of specimen other than nasopharyngeal swab, presence of viral mutation(s) within the areas targeted by this assay, and inadequate number of viral copies(<138 copies/mL). A negative result must be combined with clinical observations, patient history, and epidemiological information. The expected result is Negative.  Fact Sheet for Patients:  BloggerCourse.com  Fact Sheet for Healthcare Providers:  SeriousBroker.it  This test is no t yet approved or cleared by the [place] FDA and  has been authorized for detection and/or diagnosis of SARS-CoV-2  by FDA under an Emergency Use Authorization (EUA). This EUA will remain  in effect (meaning this test can be used) for the duration of the COVID-19 declaration under Section 564(b)(1) of the Act, 21 U.S.C.section 360bbb-3(b)(1), unless the authorization is terminated  or revoked sooner.       Influenza A by PCR NEGATIVE NEGATIVE Final   Influenza B by PCR NEGATIVE NEGATIVE Final    Comment: (NOTE) The Xpert Xpress SARS-CoV-2/FLU/RSV plus assay is intended as an aid in the diagnosis of influenza from Nasopharyngeal swab specimens and should not be used as a sole basis for treatment. Nasal washings and aspirates are unacceptable for Xpert Xpress SARS-CoV-2/FLU/RSV testing.  Fact Sheet for Patients: BloggerCourse.com  Fact Sheet for Healthcare Providers: SeriousBroker.it  This test is not yet approved or cleared by the [place] FDA and has been authorized for detection and/or diagnosis of SARS-CoV-2 by FDA under an Emergency Use Authorization (EUA). This EUA will remain in effect (meaning this test can be used) for the duration of the COVID-19 declaration under Section 564(b)(1) of the Act, 21 U.S.C. section 360bbb-3(b)(1), unless the authorization is terminated or revoked.  Performed at [facility], [address] [place], [place] [address]      Total time spend on discharging this patient, including the last patient exam, discussing the [facility] stay, instructions for ongoing care as it relates to all pertinent caregivers, as well as preparing the medical discharge records, prescriptions, and/or referrals as applicable, is 45 minutes.    Darlin Priestly, MD  [facility] 02/10/2021, 9:47 AM

## 2021-02-10 NOTE — TOC Initial Note (Addendum)
Transition of Care [facility]) - Initial/Assessment Note    Patient Details  Name: Bruce Walters MRN: [id number] Date of Birth: [birth year]  Transition of Care [facility]) CM/SW Contact:    Lockie Pares, RN Phone Number: 02/10/2021, 7:49 AM  Clinical Narrative:                  20 year old in with PE. Eligibility sent for DOAC.Marland Kitchen Patient has Medicaid, therefore is not eligible for the [facility] medication assistance program. PCP assigned and placed in patient instructions to call for follow up when it is a convenient date and time for him to go. Medications may be sent to [facility] pharmacy today if DC, they will have availability of xaralto  card for savings.  Expected Discharge Plan: Home/Self Care Barriers to Discharge: Continued Medical Work up   Patient Goals and CMS Choice    Discharge to home self care    Expected Discharge Plan and Services Expected Discharge Plan: Home/Self Care       Living arrangements for the past 2 months: Apartment                                      Prior Living Arrangements/Services Living arrangements for the past 2 months: Apartment Lives with:: Parents Patient language and need for interpreter reviewed:: Yes        Need for Family Participation in Patient Care: Yes (Comment) Care giver support system in place?: Yes (comment)   Criminal Activity/Legal Involvement Pertinent to Current Situation/Hospitalization: No - Comment as needed  Activities of Daily Living Home Assistive Devices/Equipment: None ADL Screening (condition at time of admission) Patient's cognitive ability adequate to safely complete daily activities?: Yes Is the patient deaf or have difficulty hearing?: No Does the patient have difficulty seeing, even when wearing glasses/contacts?: No Does the patient have difficulty concentrating, remembering, or making decisions?: No Patient able to express need for assistance with ADLs?: Yes Does the patient have difficulty dressing or  bathing?: No Independently performs ADLs?: Yes (appropriate for developmental age) Does the patient have difficulty walking or climbing stairs?: No Weakness of Legs: None Weakness of Arms/Hands: None  Permission Sought/Granted                  Emotional Assessment       Orientation: : Oriented to Situation, Oriented to  Time, Oriented to Place, Oriented to Self Alcohol / Substance Use: Tobacco Use Psych Involvement: No (comment)  Admission diagnosis:  Bilateral pulmonary embolism (HCC) [I26.99] Pulmonary embolism, bilateral (HCC) [I26.99] Pulmonary embolus (HCC) [I26.99] Patient Active Problem List   Diagnosis Date Noted   Pulmonary embolus (HCC) 02/09/2021   Pulmonary embolism, bilateral (HCC) 02/08/2021   Class 2 severe obesity due to excess calories with serious comorbidity and body mass index (BMI) of 36.0 to 36.9 in adult [facility]) 02/08/2021   Diabetes mellitus without complication (HCC) 02/08/2021   PCP:  Patient, No Pcp Per (Inactive) Pharmacy:   [id number] Ginette Otto, [place] - [address] AT [facility] [address]man [place] [address] Phone: [phone] Fax: [phone]  [facility] [address] [place] [address] Phone: [phone] Fax: [phone]     Social Determinants of Health (SDOH) Interventions    Readmission Risk Interventions No flowsheet data found.

## 2021-02-10 NOTE — TOC Benefit Eligibility Note (Signed)
Transition of Care [facility]) Benefit Eligibility Note    Patient Details  Name: Toshiro Hanken MRN: [id number] Date of Birth: November 16, 2000   Medication/Dose: ELIQUIS  5 MG BID  Covered?: Yes  Tier:  (NO TIER)  Prescription Coverage Preferred Pharmacy: [facility]  Spoke with Person/Company/Phone Number:: NIA   @ [employer]  RX #  [phone]  Co-Pay: ZERO DOLLARS     Deductible:  (NO DEDUCTIBLE   and   NO LOWE INCOME SUBSIDY)  Additional Notes: XARELTO   10 MF BID  : COVER-YES  CO-PAY- ZERO DOLLARS     NO TIER   P/A-NO    Mardene Sayer Phone Number: 02/10/2021, 11:12 AM

## 2021-02-10 NOTE — TOC Benefit Eligibility Note (Signed)
Transition of Care [facility]) Benefit Eligibility Note    Patient Details  Name: Bruce Walters MRN: [id number] Date of Birth: 2001-01-13   Medication/Dose: ELIQUIS  5 MG BID  Covered?: Yes  Tier:  (NO TIER)  Prescription Coverage Preferred Pharmacy: [facility]  Spoke with Person/Company/Phone Number:: [employer]IA   @ [employer]  RX #  [phone]  Co-Pay: ZERO DOLLARS     Deductible:  (NO DEDUCTIBLE   and   NO LOWE INCOME SUBSIDY)  Additional Notes: XARELTO   10 MF BID  : COVER-YES  CO-PAY- ZERO DOLLARS     NO TIER   P/A-NO    Mardene Sayer Phone Number: 02/10/2021, 11:17 AM

## 2021-02-16 ENCOUNTER — Ambulatory Visit: Payer: Medicaid Other | Admitting: Physician Assistant

## 2021-02-16 ENCOUNTER — Encounter: Payer: Self-pay | Admitting: Physician Assistant

## 2021-02-16 ENCOUNTER — Other Ambulatory Visit: Payer: Self-pay

## 2021-02-16 VITALS — BP 112/64 | HR 64 | Temp 98.7°F | Resp 18 | Ht 77.0 in | Wt 302.0 lb

## 2021-02-16 DIAGNOSIS — Z86711 Personal history of pulmonary embolism: Secondary | ICD-10-CM

## 2021-02-16 DIAGNOSIS — Z6836 Body mass index (BMI) 36.0-36.9, adult: Secondary | ICD-10-CM | POA: Diagnosis not present

## 2021-02-16 DIAGNOSIS — E1165 Type 2 diabetes mellitus with hyperglycemia: Secondary | ICD-10-CM

## 2021-02-16 MED ORDER — METFORMIN HCL 1000 MG PO TABS: 1000.0000 mg | ORAL_TABLET | Freq: Two times a day (BID) | ORAL | 0 refills | Status: AC

## 2021-02-16 MED ORDER — TRUE METRIX METER W/DEVICE KIT: 1.0000 [IU] | PACK | Freq: Two times a day (BID) | 0 refills | Status: AC

## 2021-02-16 MED ORDER — BD LANCET ULTRAFINE 30G MISC
1.0000 | Freq: Two times a day (BID) | 11 refills | Status: AC
Start: 2021-02-16 — End: ?

## 2021-02-16 MED ORDER — TRUE METRIX BLOOD GLUCOSE TEST VI STRP: 1.0000 | ORAL_STRIP | Freq: Two times a day (BID) | 12 refills | Status: AC

## 2021-02-16 MED ORDER — GLIPIZIDE 10 MG PO TABS: 10.0000 mg | ORAL_TABLET | Freq: Two times a day (BID) | ORAL | 0 refills | Status: AC

## 2021-02-16 NOTE — Patient Instructions (Addendum)
You will continue to take metformin and glipizide as directed.  Continue checking your blood glucose levels on a daily basis, I do encourage you to keep a written log and have available for all office visits.  I sent your refills to your pharmacy along with a blood glucose monitor and supplies.  Please be prepared for fasting labs at your next office visit in August when you establish care.  Please let us know if there is anything else we can do for you.  Roney Jaffe, PA-C Physician Assistant [facility] [email or web address]   Blood Glucose Monitoring, Adult Monitoring your blood sugar (glucose) is an important part of managing your diabetes. Blood glucose monitoring involves checking your blood glucose as often as directed and keeping a log orrecord of your results over time. Checking your blood glucose regularly and keeping a blood glucose log can: Help you and your health care provider adjust your diabetes management plan as needed, including your medicines or insulin. Help you understand how food, exercise, illnesses, and medicines affect your blood glucose. Let you know what your blood glucose is at any time. You can quickly find out if you have low blood glucose (hypoglycemia) or high blood glucose (hyperglycemia). Your health care provider will set individualized treatment goals for you. Your goals will be based on your age, other medical conditions you have, and how you respond to diabetes treatment. Generally, the goal of treatment is to maintain the following blood glucose levels: Before meals (preprandial): 80-130 mg/dL (4.6-2.7 mmol/L). After meals (postprandial): below 180 mg/dL (10 mmol/L). A1C level: less than 7%. Supplies needed: Blood glucose meter. Test strips for your meter. Each meter has its own strips. You must use the strips that came with your meter. A needle to prick your finger (lancet). Do not use a lancet  more than one time. A device that holds the lancet (lancing device). A journal or log book to write down your results. How to check your blood glucose Checking your blood glucose  Wash your hands for at least 20 seconds with soap and water. Prick the side of your finger (not the tip) with the lancet. Do not use the same finger consecutively. Gently rub the finger until a small drop of blood appears. Follow instructions that come with your meter for inserting the test strip, applying blood to the strip, and using your blood glucose meter. Write down your result and any notes in your log.  Using alternative sites Some meters allow you to use areas of your body other than your finger (alternative sites) to test your blood. The most common alternative sites are the forearm, thethigh, and the palm of your hand. Alternative sites may not be as accurate as the fingers because blood flow is slower in those areas. This means that the result you get may be delayed, andit may be different from the result that you would get from your finger. Use the finger only, and do not use alternative sites, if: You think you have hypoglycemia. You sometimes do not know that your blood glucose is getting low (hypoglycemia unawareness). General tips and recommendations Blood glucose log  Every time you check your blood glucose, write down your result. Also write down any notes about things that may be affecting your blood glucose, such as your diet and exercise for the day. This information can help you and your health care provider: Look for patterns in your blood glucose over time. Adjust your diabetes management plan as  needed. Check if your meter allows you to download your records to a computer or if there is an app for the meter. Most glucose meters store a record of glucose readings in the meter.  If you have type 1 diabetes: Check your blood glucose 4 or more times a day if you are on intensive insulin  therapy with multiple daily injections (MDI) or if you are using an insulin pump. Check your blood glucose: Before every meal and snack. Before bedtime. Also check your blood glucose: If you have symptoms of hypoglycemia. After treating low blood glucose. Before doing activities that create a risk for injury, like driving or using machinery. Before and after exercise. Two hours after a meal. Occasionally between 2:00 a.m. and 3:00 a.m., as directed. You may need to check your blood glucose more often, 6-10 times per day, if: You have diabetes that is not well controlled. You are ill. You have a history of severe hypoglycemia. You have hypoglycemia unawareness. If you have type 2 diabetes: Check your blood glucose 2 or more times a day if you take insulin or other diabetes medicines. Check your blood glucose 4 or more times a day if you are on intensive insulin therapy. Occasionally, you may also need to check your glucose between 2:00 a.m. and 3:00 a.m., as directed. Also check your blood glucose: Before and after exercise. Before doing activities that create a risk for injury, like driving or using machinery. You may need to check your blood glucose more often if: Your medicine is being adjusted. Your diabetes is not well controlled. You are ill. General tips Make sure you always have your supplies with you. After you use a few boxes of test strips, adjust (calibrate) your blood glucose meter by following instructions that came with your meter. If you have questions or need help, all blood glucose meters have a 24-hour hotline phone number available that you can call. Also contact your health care provider with questions or concerns you may have. Where to find more information The American Diabetes Association: www.diabetes.org The Association of Diabetes Care & Education Specialists: www.diabeteseducator.org Contact a health care provider if: Your blood glucose is at or above 240  mg/dL (46.5 mmol/L) for 2 days in a row. You have been sick or have had a fever for 2 days or longer, and you are not getting better. You have any of the following problems for more than 6 hours: You cannot eat or drink. You have nausea or vomiting. You have diarrhea. Get help right away if: Your blood glucose is lower than 54 mg/dL (3 mmol/L). You become confused, or you have trouble thinking clearly. You have difficulty breathing. You have moderate or large ketone levels in your urine. These symptoms may represent a serious problem that is an emergency. Do not wait to see if the symptoms will go away. Get medical help right away. Call your local emergency services (911 in the [place] Do not drive yourself to the hospital. Summary Monitoring your blood glucose is an important part of managing your diabetes. Blood glucose monitoring involves checking your blood glucose as often as directed and keeping a log or record of your results over time. Your health care provider will set individualized treatment goals for you. Your goals will be based on your age, other medical conditions you have, and how you respond to diabetes treatment. Every time you check your blood glucose, write down your result. Also, write down any notes about things that may  be affecting your blood glucose, such as your diet and exercise for the day. This information is not intended to replace advice given to you by your health care provider. Make sure you discuss any questions you have with your healthcare provider. Document Revised: 05/04/2020 Document Reviewed: 05/04/2020 Elsevier Patient Education  2022 [employer].

## 2021-02-16 NOTE — Progress Notes (Signed)
Patient has eaten and taken medication today Patient reports pain in the chest with tightening and pressure scaled at a 6. Patient shares deep breathing causes some discomfort.

## 2021-02-16 NOTE — Progress Notes (Signed)
 New Patient Office Visit  Subjective:  Patient ID: Bruce Walters, male    DOB: 01/21/2001  Age: 19 y.o. MRN: [id number]  CC:  Chief Complaint  Patient presents with   Hospitalization Follow-up    Pulmonary Embolism    HPI Bruce Walters reports that he was hospitalized from June 21 to February 10, 2021.  Discharge note is not available at this time, however last assessment and plan from June 24 is as follows  Assessment & Plan:   Principal Problem:   Pulmonary embolism, bilateral (HCC) Active Problems:   Class 2 severe obesity due to excess calories with serious comorbidity and body mass index (BMI) of 36.0 to 36.9 in adult (HCC)   Diabetes mellitus without complication (HCC)   Pulmonary embolus (HCC)     Bruce Walters is a 19 y.o. male with medical history significant of DM, obesity, and asthma presenting with CP/SOB found to have bilateral PE.   Bilateral PE -Patient without prior episodes of thromboembolic disease presenting with new PE -No FH to suggest clotting disorder -Recent prolonged car trip, however, neg for DVT in BLE. --started on IV heparin on admission --pulm consulted on admission. Plan: --transition to Xarelto today --outpatient hematologist referral   Atypical pulmonary nodule right upper lobe, undetermined significance --appears inflammatory or possibly pulmonary infarct --outpatient followup with [facility]    DM, poorly controlled -He was seen at UC and then ED on 10/15/20 and found to have glucose in the 300-600s -He reports general compliance with metformin, has not been taking glipizide -He has been unable to f/u with a PCP due to insurance reasons -Modestly elevated glucose today, A1c is 10.6 indicating poor control Plan: --hold metformin while inpatient --SSI --will establish with PCP now that pt has MediCaid   Obesity -BMI 36.08   Hypomag --replete with IV mag   Abdominal pain --diffuse, non-specific and present for months.   Normal oral intake and BM. --may be due to uncontrolled DM. --Monitor for now   Mother is present with him today.  Reports that he is still having some chest discomfort and some shortness of breath when moving around.  Reports that he is taking the Xarelto as directed.  Reports that he has been mostly resting, states that he was given a 2-week rest period from work.  He has 1 more week left on that.  Reports that he has been compliant to his diabetes medications.  Reports that he had a blood glucose reading this morning while fasting of 107.     Past Medical History:  Diagnosis Date   Asthma    Class 2 severe obesity due to excess calories with serious comorbidity and body mass index (BMI) of 36.0 to 36.9 in adult (HCC)    Diabetes mellitus without complication (HCC)     History reviewed. No pertinent surgical history.  History reviewed. No pertinent family history.  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   Social Determinants of Health   Financial Resource Strain: Not on file  Food Insecurity: Not on file  Transportation Needs: Not on file  Physical Activity: Not on file  Stress: Not on file  Social Connections: Not on file  Intimate Partner Violence: Not on file    ROS Review of Systems  Constitutional: Negative.   HENT: Negative.    Eyes: Negative.   Respiratory:  Positive for chest tightness and shortness of breath.   Cardiovascular:  Negative for chest pain.   Gastrointestinal: Negative.   Endocrine: Negative.   Genitourinary: Negative.   Musculoskeletal: Negative.   Skin: Negative.   Allergic/Immunologic: Negative.   Neurological: Negative.   Hematological: Negative.   Psychiatric/Behavioral: Negative.     Objective:   Today's Vitals: BP 112/64 (BP Location: Right Arm, Patient Position: Sitting, Cuff Size: Large)   Pulse 64   Temp 98.7 F (37.1 C) (Oral)   Resp 18   Ht 6' 5" (1.956 m)   Wt (!) 302 lb (137 kg)   SpO2 96%   BMI 35.81 kg/m   Physical Exam Vitals and nursing note reviewed.  Constitutional:      General: He is not in acute distress.    Appearance: Normal appearance. He is not ill-appearing.  HENT:     Head: Normocephalic and atraumatic.     Right Ear: External ear normal.     Left Ear: External ear normal.     Nose: Nose normal.     Mouth/Throat:     Mouth: Mucous membranes are moist.     Pharynx: Oropharynx is clear.  Eyes:     Extraocular Movements: Extraocular movements intact.     Conjunctiva/sclera: Conjunctivae normal.     Pupils: Pupils are equal, round, and reactive to light.  Cardiovascular:     Rate and Rhythm: Normal rate and regular rhythm.     Pulses: Normal pulses.     Heart sounds: Normal heart sounds.  Pulmonary:     Effort: Pulmonary effort is normal.     Breath sounds: Normal breath sounds.  Musculoskeletal:        General: Normal range of motion.     Cervical back: Normal range of motion and neck supple.  Skin:    General: Skin is warm and dry.  Neurological:     General: No focal deficit present.     Mental Status: He is alert and oriented to person, place, and time.  Psychiatric:        Mood and Affect: Mood normal.        Behavior: Behavior normal.        Thought Content: Thought content normal.        Judgment: Judgment normal.    Assessment & Plan:   Problem List Items Addressed This Visit       Cardiovascular and Mediastinum   Pulmonary embolism, bilateral ([place])      Endocrine   Diabetes mellitus without complication ([place]) - Primary   Relevant Medications   glipiZIDE (GLUCOTROL) 10 MG tablet   metFORMIN (GLUCOPHAGE) 1000 MG tablet   Blood Glucose Monitoring Suppl (TRUE METRIX METER) w/Device KIT   Lancets (BD LANCET ULTRAFINE 30G) MISC   glucose blood (TRUE METRIX BLOOD GLUCOSE TEST) test strip     Other   Class 2 severe obesity due to excess calories with serious comorbidity and body mass index (BMI) of 36.0 to 36.9 in adult [facility])  Relevant Medications   glipiZIDE (GLUCOTROL) 10 MG tablet   metFORMIN (GLUCOPHAGE) 1000 MG tablet    Outpatient Encounter Medications as of 02/16/2021  Medication Sig   Blood Glucose Monitoring Suppl (TRUE METRIX METER) w/Device KIT 1 Units by Does not apply route 2 (two) times daily.   glucose blood (TRUE METRIX BLOOD GLUCOSE TEST) test strip 1 each by Other route 2 (two) times daily. Use as instructed   Lancets (BD LANCET ULTRAFINE 30G) MISC 1 each by Does not apply route 2 (two) times daily.   ondansetron (ZOFRAN-ODT) 8 MG disintegrating tablet Take 1 tablet (8 mg total) by mouth every 8 (eight) hours as needed for nausea or vomiting.   RIVAROXABAN (XARELTO) VTE STARTER PACK (15 & 20 MG) Follow package directions: Take one 10m tablet by mouth twice a day. On day 22, switch to one 282mtablet once a day. Take with food.   [DISCONTINUED] glipiZIDE (GLUCOTROL) 10 MG tablet Take 1 tablet (10 mg total) by mouth 2 (two) times daily before a meal.   [DISCONTINUED] metFORMIN (GLUCOPHAGE) 1000 MG tablet Take 1 tablet (1,000 mg total) by mouth 2 (two) times daily with a meal.   glipiZIDE (GLUCOTROL) 10 MG tablet Take 1 tablet (10 mg total) by mouth 2 (two) times daily before a meal.   metFORMIN (GLUCOPHAGE) 1000 MG tablet Take 1 tablet (1,000 mg total) by mouth 2 (two) times daily with a meal.   No facility-administered encounter medications on file as of 02/16/2021.   1. Uncontrolled type 2 diabetes mellitus with  [facility]glycemia [facility] in February 2022.  Continue current regimen Patient has [facility] Red flags given for prompt reevaluation.  Patient encouraged to continue checking blood glucose at home twice a day, keep a written log and have available for all office visits. - Ambulatory referral to Endocrinology - glipiZIDE (GLUCOTROL) 10 MG tablet; Take 1 tablet (10 mg total) by mouth 2 (two) times daily before a meal.  Dispense: 180 tablet; Refill: 0 - metFORMIN (GLUCOPHAGE) 1000 MG tablet; Take 1 tablet (1,000 mg total) by mouth 2 (two) times daily with a meal.  Dispense: 180 tablet; Refill: 0 - Blood Glucose Monitoring Suppl (TRUE METRIX METER) w/Device KIT; 1 Units by Does not apply route 2 (two) times daily.  Dispense: 1 kit; Refill: 0 - Lancets (BD LANCET ULTRAFINE 30G) MISC; 1 each by Does not apply route 2 (two) times daily.  Dispense: 100 each; Refill: 11 - glucose blood (TRUE METRIX BLOOD GLUCOSE TEST) test strip; 1 each by Other route 2 (two) times daily. Use as instructed  Dispense: 100 each; Refill: 12  2. History of pulmonary embolism Patient has upcoming appointment to follow-up with pulmonology on March 15, 2021.  Red flags given for prompt reevaluation.  Refer to hematology to help determine cause of blood clotting issue. - Ambulatory referral to Hematology / Oncology  3. Class 2 severe obesity due to excess calories with serious comorbidity and body mass index (BMI) of 36.0 to 36.9 in adult [facility]   I have reviewed the patient's medical history (PMH, PSH, Social History, Family History, Medications, and allergies) , and have been updated if relevant. I spent 32 minutes reviewing chart and  face to face time with patient.    Follow-up: Return if symptoms worsen or fail to improve.   CaLoraine Gripayers, PA-C

## 2021-02-22 ENCOUNTER — Other Ambulatory Visit (HOSPITAL_BASED_OUTPATIENT_CLINIC_OR_DEPARTMENT_OTHER): Payer: Self-pay

## 2021-02-22 ENCOUNTER — Telehealth (HOSPITAL_BASED_OUTPATIENT_CLINIC_OR_DEPARTMENT_OTHER): Payer: Self-pay | Admitting: Pharmacist

## 2021-02-22 NOTE — Telephone Encounter (Signed)
Transitions of Care Pharmacy   Call attempted for a pharmacy transitions of care follow-up. Not able to LVM.  Call attempt #1. Will follow-up in 2-3 days.    Theodis Sato, PharmD Clinical Pharmacist [facility]  02/22/2021 1:04 PM

## 2021-02-23 NOTE — Telephone Encounter (Signed)
   Transitions of Care Pharmacy   Call attempted for a pharmacy transitions of care follow-up. Not able to LVM.   Call attempt #2. Will follow-up in 2-3 days.   Theodis Sato, PharmD Clinical Pharmacist [facility]  02/23/2021 2:37 PM

## 2021-03-06 ENCOUNTER — Encounter: Payer: Self-pay | Admitting: Endocrinology

## 2021-03-15 ENCOUNTER — Ambulatory Visit (INDEPENDENT_AMBULATORY_CARE_PROVIDER_SITE_OTHER): Payer: Medicaid Other | Admitting: Pulmonary Disease

## 2021-03-15 ENCOUNTER — Encounter: Payer: Self-pay | Admitting: Pulmonary Disease

## 2021-03-15 ENCOUNTER — Other Ambulatory Visit: Payer: Self-pay

## 2021-03-15 VITALS — BP 108/56 | HR 81 | Ht <= 58 in | Wt 318.6 lb

## 2021-03-15 DIAGNOSIS — I2699 Other pulmonary embolism without acute cor pulmonale: Secondary | ICD-10-CM

## 2021-03-15 DIAGNOSIS — R911 Solitary pulmonary nodule: Secondary | ICD-10-CM

## 2021-03-15 MED ORDER — RIVAROXABAN 20 MG PO TABS: 20.0000 mg | ORAL_TABLET | Freq: Every day | ORAL | 4 refills | Status: AC

## 2021-03-15 NOTE — Patient Instructions (Signed)
Nice to meet you  I sent a refill for Xarelto, should be a total of 5 more months, will plan on at least 6 months of the blood thinner.  Take 1 pill daily.  I sent a referral to the blood doctors.  I think would be valuable to meet with him and discuss everything is happening and see what additional recommendations they may have for testing in the future.  I will order a repeat CT scan of your chest to be performed in the first or second week of August, this is checkup on the small spot in your lungs that is probably related to the inflammation associated with the blood clot you are having.  We will meet again in 6 weeks, we can discuss her symptoms and if additional testing needs to be done.  Return to clinic for follow-up with Dr. Judeth Horn in 6 weeks or sooner as needed

## 2021-03-15 NOTE — Progress Notes (Signed)
_0  ID: Bruce Walters, male    DOB: 2000-09-04, 20 y.o.   MRN: [id number]  Chief Complaint  Patient presents with   Consult    Having chest pains, shortness of breath. Was in ER around June 2022 and stated patient had blood clots in lungs.     Referring provider: No ref. provider found  HPI:   20 year old man whom we are seeing in hospital follow-up for pulmonary embolus.  Pulmonary consultation note 01/2021 reviewed.  H&P reviewed, discharge summary reviewed.  Patient presented to ED 02/01/21.  A few days of chest pain, shortness of breath.  Dyspnea on exertion present.  Notes about a week prior had driven to and from [place].  About 9 hours each way.  No clear alleviating or exacerbating factors with exception of exertion making things worse.  No positional changes made things better or worse.  No time of day when things are better or worse.  No exposure to COVID.  He tested COVID-negative in the ED.  CTA PE protocol was obtained which on monitor rotation revealed bilateral filling defects, faint groundglass opacity right upper lobe, apex.  He was admitted to the hospital placed on heparin.  He was transitioned to Xarelto.  TTE performed showed normal RV function.  Reports history of asthma.  Diagnosed as a child.  Only needed albuterol as needed.  Breathing has been pretty stable as he has grown up.  No longer needing inhalers.  PMH: Asthma, pulmonary embolus sick/2022, provoked in setting of long car ride to and from [place], submassive, TTE with preserved RV function Social history: Reviewed, none Family history: Reviewed, no rectal issues in first-degree relatives, no history of VTE, paternal grandfather with cancer Social history: Grew up in [place], goes to [employer], lives with mother in [place] / Pulmonary Flowsheets:   ACT:  No flowsheet data found.  MMRC: No flowsheet data found.  Epworth:  No flowsheet data found.  Tests:   FENO:  No  results found for: NITRICOXIDE  PFT: No flowsheet data found.  WALK:  No flowsheet data found.  Imaging: Personally reviewed and as per EMR discussion this note  Lab Results: Personally reviewed CBC    Component Value Date/Time   WBC 8.2 02/10/2021 0121   RBC 5.93 (H) 02/10/2021 0121   HGB 16.3 02/10/2021 0121   HCT 48.3 02/10/2021 0121   PLT 217 02/10/2021 0121   MCV 81.5 02/10/2021 0121   MCH 27.5 02/10/2021 0121   MCHC 33.7 02/10/2021 0121   RDW 11.9 02/10/2021 0121   LYMPHSABS 2.3 02/07/2021 1912   MONOABS 0.6 02/07/2021 1912   EOSABS 0.4 02/07/2021 1912   BASOSABS 0.1 02/07/2021 1912    BMET    Component Value Date/Time   NA 137 02/10/2021 0121   K 3.5 02/10/2021 0121   CL 98 02/10/2021 0121   CO2 28 02/10/2021 0121   GLUCOSE 138 (H) 02/10/2021 0121   BUN 7 02/10/2021 0121   CREATININE 1.26 (H) 02/10/2021 0121   CALCIUM 9.7 02/10/2021 0121   GFRNONAA >60 02/10/2021 0121    BNP    Component Value Date/Time   BNP 18.0 02/08/2021 0623    ProBNP No results found for: PROBNP  Specialty Problems   None   No Known Allergies   There is no immunization history on file for this patient.  Past Medical History:  Diagnosis Date   Asthma    Class 2 severe obesity due to excess calories with serious comorbidity and  body mass index (BMI) of 36.0 to 36.9 in adult [facility])    Diabetes mellitus without complication ([place])     Tobacco History: Social History   Tobacco Use  Smoking Status Never  Smokeless Tobacco Never   Counseling given: Not Answered   Continue to not smoke  Outpatient Encounter Medications as of 03/15/2021  Medication Sig   Blood Glucose Monitoring Suppl (TRUE METRIX METER) w/Device KIT 1 Units by Does not apply route 2 (two) times daily.   glipiZIDE (GLUCOTROL) 10 MG tablet Take 1 tablet (10 mg total) by mouth 2 (two) times daily before a meal.   glucose blood (TRUE METRIX BLOOD GLUCOSE TEST) test strip 1 each by Other route 2 (two)  times daily. Use as instructed   Lancets (BD LANCET ULTRAFINE 30G) MISC 1 each by Does not apply route 2 (two) times daily.   metFORMIN (GLUCOPHAGE) 1000 MG tablet Take 1 tablet (1,000 mg total) by mouth 2 (two) times daily with a meal.   rivaroxaban (XARELTO) 20 MG TABS tablet Take 1 tablet (20 mg total) by mouth daily with supper.   [DISCONTINUED] RIVAROXABAN (XARELTO) VTE STARTER PACK (15 & 20 MG) Follow package directions: Take one 87m tablet by mouth twice a day. On day 22, switch to one 241mtablet once a day. Take with food.   ondansetron (ZOFRAN-ODT) 8 MG disintegrating tablet Take 1 tablet (8 mg total) by mouth every 8 (eight) hours as needed for nausea or vomiting. (Patient not taking: Reported on 03/15/2021)   No facility-administered encounter medications on file as of 03/15/2021.     Review of Systems  Review of Systems  Occasional chest tightness, intermittent right arm and right leg pain worse with exertion but seems relieved with rest.  No worsening lower extremity swelling.  No orthopnea or PND.  Comprehensive review of systems otherwise negative. Physical Exam  BP (!) 108/56   Pulse 81   Ht (!) 6.6" (0.168 m)   Wt (!) 318 lb 9.6 oz (144.5 kg)   SpO2 97%   BMI 5142.34 kg/m   Wt Readings from Last 5 Encounters:  03/15/21 (!) 318 lb 9.6 oz (144.5 kg) (>99 %, Z= 3.22)*  02/16/21 (!) 302 lb (137 kg) (>99 %, Z= 3.06)*  02/05/21 (!) 304 lb 3.8 oz (138 kg) (>99 %, Z= 3.08)*  02/03/21 (!) 304 lb 14.3 oz (138.3 kg) (>99 %, Z= 3.09)*  09/22/20 (!) 305 lb (138.3 kg) (>99 %, Z= 3.09)*   * Growth percentiles are based on CDC (Boys, 2-20 Years) data.    BMI Readings from Last 5 Encounters:  03/15/21 5142.34 kg/m (>99 %, Z= 4.11)*  02/16/21 35.81 kg/m (>99 %, Z= 2.38)*  02/08/21 36.08 kg/m (>99 %, Z= 2.41)*  02/05/21 36.08 kg/m (>99 %, Z= 2.41)*  02/03/21 36.16 kg/m (>99 %, Z= 2.42)*   * Growth percentiles are based on CDC (Boys, 2-20 Years) data.     Physical  Exam General: Sitting in chair, in no acute distress Eyes: EOMI, no icterus Neck: Supple, no JVP Cardiovascular: Regular rate and rhythm, occasional PVC, no murmur Pulmonary: Clear to auscultation bilaterally, distant Abdomen: Nondistended, bowel sounds present MSK: No synovitis, no joint effusion Neuro: Normal gait, no weakness Psych: Normal mood, flat affect   Assessment & Plan:   Pulmonary embolus: Submassive, TTE shows normal RV function.  Relatively large clot burden bilaterally.  Seen provoked [place] Symptoms slowly improving but persistent.  Plan for  at least 6 months of anticoagulation.  Xarelto was refilled today, just completed starter pack and first month.  Referral to hematology given young age, assess for hypercoagulable work-up.  Dyspnea on exertion, chest pain, leg pain: Unclear etiology.  Been on blood thinner for 1 month.  Unlikely new VTE.  Notably lower extremity Dopplers did not show any clot in bilateral lower extremities.  Suspect related to deconditioning, getting stamina back, MSK pain.  Slowly improving over time per his report.  This is expected.  Encouraged him to contact us if any worsening in the interim between next visit.  If symptoms are persisting, would consider repeat lower extremity Dopplers, VQ scan after 3 months of anticoagulation.  This can be done sooner if symptoms worsen.  Abnormal CT scan: Right upper lobe, apical groundglass opacity.  Favored to be reactive or inflammatory in the setting of acute PE.  Repeat CT scan ordered for early August, approximate 6 weeks after initial scan to evaluate for resolution.   Return in about 6 weeks (around 04/26/2021).   Lanier Clam, MD 03/15/2021   This appointment required 65 minutes of patient care (this includes precharting, chart review, review of results, face-to-face care, etc.).

## 2021-03-27 ENCOUNTER — Other Ambulatory Visit: Payer: Self-pay

## 2021-03-27 MED ORDER — FLUCONAZOLE 150 MG PO TABS
ORAL_TABLET | ORAL | 0 refills | Status: DC
  Filled 2021-03-27: qty 4, 28d supply, fill #0

## 2021-03-31 ENCOUNTER — Telehealth: Payer: Self-pay | Admitting: Hematology and Oncology

## 2021-03-31 ENCOUNTER — Inpatient Hospital Stay: Admission: RE | Admit: 2021-03-31 | Payer: Medicaid Other | Source: Ambulatory Visit

## 2021-03-31 NOTE — Telephone Encounter (Signed)
Received a new hem referral from [facility] for pulmonary embolism. Bruce Walters reutrned my call and has been scheduled to see Dr. Al Pimple on 8/29 at 1040am.

## 2021-04-13 NOTE — Progress Notes (Signed)
Subjective:    Bruce Walters - 20 y.o. male MRN [id number]  Date of birth: 2001/06/28  HPI  Bruce Walters is to establish care.   Current issues and/or concerns: DIABETES TYPE 2 FOLLOW-UP: Visit 02/16/2021 at [facility] per PA note: Patient with new diagnosis of diabetes in February 2022.  Continue current regimen Patient has appointment to establish care on August 26 at Primary Care at [facility].  Red flags given for prompt reevaluation.  Patient encouraged to continue checking blood glucose at home twice a day, keep a written log and have available for all office visits.  04/14/2021: Last A1C:  10.6% on 02/08/2021 Med Adherence:  [x]  Yes    []  No Medication side effects:  []  Yes    [x]  No Home Monitoring?  [x]  Yes    []  No Home glucose results range: varies having high and low blood sugars sometimes 60's up to 200's Diet Adherence: [x]  Yes    []  No Exercise: []  Yes    [x]  No Last eye exam: earlier this year  Comments: Would like to know if he is diabetic type 1 or diabetic type 2. Family history of diabetes in grandfather.    2. HEADACHES: Happening intermittently. May go a few weeks without headache. Tylenol helps some. Endorses aura prior to headache but unable to describe. Denies any recent changes in headache. Denies red flag symptoms.     ROS per HPI    Health Maintenance: Health Maintenance Due  Topic Date Due   PNEUMOCOCCAL POLYSACCHARIDE VACCINE AGE 19-64 HIGH RISK  Never done   COVID-19 Vaccine (1) Never done   Pneumococcal Vaccine 67-72 Years old (1 - PPSV23) Never done   FOOT EXAM  Never done   OPHTHALMOLOGY EXAM  Never done   URINE MICROALBUMIN  Never done   HPV VACCINES (1 - Male 2-dose series) Never done   Hepatitis C Screening  Never done   INFLUENZA VACCINE  03/20/2021     Past Medical History: Patient Active Problem List   Diagnosis Date Noted   Pulmonary embolus (HCC) 02/09/2021   Pulmonary embolism, bilateral (HCC) 02/08/2021   Class 2  severe obesity due to excess calories with serious comorbidity and body mass index (BMI) of 36.0 to 36.9 in adult [facility]) 02/08/2021   Diabetes mellitus without complication (HCC) 02/08/2021     Social History   reports that he has never smoked. He has never used smokeless tobacco. He reports that he does not drink alcohol and does not use drugs.   Family History  family history includes Diabetes Mellitus I in his maternal grandfather.   Medications: reviewed and updated   Objective:   Physical Exam BP 117/73 (BP Location: Left Arm, Patient Position: Sitting, Cuff Size: Large)   Pulse 96   Temp 98.4 F (36.9 C) (Oral)   Resp 16   Ht 6\' 6"  (1.981 m)   Wt (!) 317 lb 9.6 oz (144.1 kg)   SpO2 96%   BMI 36.70 kg/m   Physical Exam HENT:     Head: Normocephalic and atraumatic.  Eyes:     Extraocular Movements: Extraocular movements intact.     Conjunctiva/sclera: Conjunctivae normal.     Pupils: Pupils are equal, round, and reactive to light.  Cardiovascular:     Rate and Rhythm: Normal rate and regular rhythm.     Pulses: Normal pulses.     Heart sounds: Normal heart sounds.  Pulmonary:     Effort: Pulmonary effort is normal.  Breath sounds: Normal breath sounds.  Musculoskeletal:     Cervical back: Normal range of motion and neck supple.  Neurological:     General: No focal deficit present.     Mental Status: He is alert and oriented to person, place, and time.  Psychiatric:        Mood and Affect: Mood normal.        Behavior: Behavior normal.       Assessment & Plan:  1. Encounter to establish care: - Patient presents today to establish care.  - Return for annual physical examination, labs, and health maintenance. Arrive fasting meaning having no food for at least 8 hours prior to appointment. You may have only water or black coffee. Please take scheduled medications as normal.  2. Type 2 diabetes mellitus without complication, without long-term current use of  insulin (HCC): - Patient newly diagnosed diabetes February 2022.  - Hemoglobin 10.6% on 02/08/2021 and referred to Endocrinology at that time. - Continue Metformin and Glipizide as prescribed.  - Patient would like to know if he is type 1 diabetic or type 2 diabetic. Will defer to Endocrinology.  - Discussed recommendation for insulin therapy. Will defer to Endocrinology.  - Discussed the importance of healthy eating habits, low-carbohydrate diet, low-sugar diet, regular aerobic exercise (at least 150 minutes a week as tolerated) and medication compliance to achieve or maintain control of diabetes. - Follow-up with primary provider as scheduled.  - Ambulatory referral to Endocrinology  3. Nonintractable episodic headache, unspecified headache type: - Begin Sumatriptan as prescribed. Discussed medication compliance and adverse effects.  - Follow-up with primary provider in 4 weeks or sooner if needed.  - SUMAtriptan (IMITREX) 25 MG tablet; Take 1 tablet (25 mg total) by mouth at the start of the headache. May repeat in 2 hours x 1 if headache persists. Max of 2 tabs/24 hours.  Dispense: 30 tablet; Refill: 0      Patient was given clear instructions to go to Emergency Department or return to medical center if symptoms don't improve, worsen, or new problems develop.The patient verbalized understanding.  I discussed the assessment and treatment plan with the patient. The patient was provided an opportunity to ask questions and all were answered. The patient agreed with the plan and demonstrated an understanding of the instructions.   The patient was advised to call back or seek an in-person evaluation if the symptoms worsen or if the condition fails to improve as anticipated.    Ricky Stabs, NP 04/14/2021, 1:05 PM Primary Care at [facility]

## 2021-04-14 ENCOUNTER — Encounter: Payer: Self-pay | Admitting: Family

## 2021-04-14 ENCOUNTER — Ambulatory Visit (INDEPENDENT_AMBULATORY_CARE_PROVIDER_SITE_OTHER): Payer: Medicaid Other | Admitting: Family

## 2021-04-14 ENCOUNTER — Other Ambulatory Visit: Payer: Self-pay

## 2021-04-14 VITALS — BP 117/73 | HR 96 | Temp 98.4°F | Resp 16 | Ht 78.0 in | Wt 317.6 lb

## 2021-04-14 DIAGNOSIS — R519 Headache, unspecified: Secondary | ICD-10-CM | POA: Diagnosis not present

## 2021-04-14 DIAGNOSIS — Z7689 Persons encountering health services in other specified circumstances: Secondary | ICD-10-CM | POA: Diagnosis not present

## 2021-04-14 DIAGNOSIS — E119 Type 2 diabetes mellitus without complications: Secondary | ICD-10-CM | POA: Diagnosis not present

## 2021-04-14 MED ORDER — SUMATRIPTAN SUCCINATE 25 MG PO TABS
ORAL_TABLET | ORAL | 0 refills | Status: AC
Start: 2021-04-14 — End: ?

## 2021-04-14 NOTE — Progress Notes (Signed)
Patient is here to est care with provider. Patient is concern about is health DM2. Patient would like to know what type of DM he is. Patient would also like to talk about his headaches.

## 2021-04-17 ENCOUNTER — Inpatient Hospital Stay: Payer: Medicaid Other | Attending: Hematology and Oncology | Admitting: Hematology and Oncology

## 2021-04-17 ENCOUNTER — Inpatient Hospital Stay: Payer: Medicaid Other

## 2021-04-17 ENCOUNTER — Other Ambulatory Visit: Payer: Self-pay

## 2021-04-17 ENCOUNTER — Encounter: Payer: Self-pay | Admitting: Hematology and Oncology

## 2021-04-17 VITALS — BP 128/74 | HR 94 | Temp 97.8°F | Resp 18 | Ht 78.0 in | Wt 327.6 lb

## 2021-04-17 DIAGNOSIS — Z7901 Long term (current) use of anticoagulants: Secondary | ICD-10-CM | POA: Diagnosis not present

## 2021-04-17 DIAGNOSIS — Z7984 Long term (current) use of oral hypoglycemic drugs: Secondary | ICD-10-CM | POA: Diagnosis not present

## 2021-04-17 DIAGNOSIS — I2693 Single subsegmental pulmonary embolism without acute cor pulmonale: Secondary | ICD-10-CM | POA: Diagnosis present

## 2021-04-17 DIAGNOSIS — J90 Pleural effusion, not elsewhere classified: Secondary | ICD-10-CM | POA: Diagnosis not present

## 2021-04-17 DIAGNOSIS — E119 Type 2 diabetes mellitus without complications: Secondary | ICD-10-CM | POA: Insufficient documentation

## 2021-04-17 DIAGNOSIS — Z833 Family history of diabetes mellitus: Secondary | ICD-10-CM | POA: Insufficient documentation

## 2021-04-17 DIAGNOSIS — I313 Pericardial effusion (noninflammatory): Secondary | ICD-10-CM | POA: Insufficient documentation

## 2021-04-17 DIAGNOSIS — I2699 Other pulmonary embolism without acute cor pulmonale: Secondary | ICD-10-CM

## 2021-04-17 LAB — ANTITHROMBIN III: AntiThromb III Func: 114 % (ref 75–120)

## 2021-04-17 NOTE — Assessment & Plan Note (Signed)
This is a pleasant 20 year old male patient with no significant past medical history who was found to have bilateral PE referred to hematology for additional anticoagulation recommendations and clotting work-up. Patient denies any family history of clotting disorders or other clear provoking factors besides a long car trip to [place]. Physical examination today, healthy appearing male patient who appears to be overweight but no other findings.  We have discussed about etiology and risk factors for DVT/PE including stasis, surgery, trauma, hypercoagulable state, estrogen supplementation in females, testosterone supplementation in males, occasionally malignancies etc.  Given he being so young at the first episode of clot, I do recommend proceeding with clotting work-up.  We have discussed that he should consider anticoagulation for at least 6 months unless his hypercoagulable work-up shows any significant genetic mutations that require lifelong anticoagulation.  He is tolerating Xarelto very well and he is to continue anticoagulation unless otherwise instructed. There is increased risk of bleeding with blood thinners on board however benefits certainly overweigh his risks Long-term side effects of the new novel anticoagulants remain unknown at this time. We will proceed with hypercoagulable work-up and he can return to clinic in 4 weeks to review results and to discuss any additional recommendations.  He currently has at least 4 months worth of refills of Xarelto.  All his questions were answered to the best of my knowledge.  Thank you for consulting [place] in the care of this patient.  Please do not hesitate to contact us with any additional questions or concerns.

## 2021-04-17 NOTE — Progress Notes (Signed)
[facility] CONSULT NOTE  Patient Care Team: Patient, No Pcp Per (Inactive) as PCP - General (General Practice)  CHIEF COMPLAINTS/PURPOSE OF CONSULTATION:  Pulmonary embolism  ASSESSMENT & PLAN:  Pulmonary embolism, bilateral (HCC) This is a pleasant 20 year old male patient with no significant past medical history who was found to have bilateral PE referred to hematology for additional anticoagulation recommendations and clotting work-up. Patient denies any family history of clotting disorders or other clear provoking factors besides a long car trip to [place]. Physical examination today, healthy appearing male patient who appears to be overweight but no other findings.  We have discussed about etiology and risk factors for DVT/PE including stasis, surgery, trauma, hypercoagulable state, estrogen supplementation in females, testosterone supplementation in males, occasionally malignancies etc.  Given he being so young at the first episode of clot, I do recommend proceeding with clotting work-up.  We have discussed that he should consider anticoagulation for at least 6 months unless his hypercoagulable work-up shows any significant genetic mutations that require lifelong anticoagulation.  He is tolerating Xarelto very well and he is to continue anticoagulation unless otherwise instructed. There is increased risk of bleeding with blood thinners on board however benefits certainly overweigh his risks Long-term side effects of the new novel anticoagulants remain unknown at this time. We will proceed with hypercoagulable work-up and he can return to clinic in 4 weeks to review results and to discuss any additional recommendations.  He currently has at least 4 months worth of refills of Xarelto.  All his questions were answered to the best of my knowledge.  Thank you for consulting [place] in the care of this patient.  Please do not hesitate to contact us with any additional questions or  concerns.  Orders Placed This Encounter  Procedures   Lupus anticoagulant panel    Standing Status:   Future    Standing Expiration Date:   04/17/2022   Prothrombin gene mutation    Standing Status:   Future    Standing Expiration Date:   04/17/2022   Factor 5 leiden    Standing Status:   Future    Standing Expiration Date:   04/17/2022   Hexagonal Phospholipid Neutralization    Standing Status:   Future    Standing Expiration Date:   04/17/2022   Cardiolipin antibodies, IgG, IgM, IgA    Standing Status:   Future    Standing Expiration Date:   04/17/2022   Antithrombin III    Standing Status:   Future    Standing Expiration Date:   04/17/2022   Protein C activity    Standing Status:   Future    Standing Expiration Date:   04/17/2022   Protein S activity    Standing Status:   Future    Standing Expiration Date:   04/17/2022   Beta-2-glycoprotein i abs, IgG/M/A    Standing Status:   Future    Standing Expiration Date:   04/17/2022     HISTORY OF PRESENTING ILLNESS:   Bruce Walters 20 y.o. male is here because of PE  This is an 20 year old male patient who was recently diagnosed with PE in June 2022 after a prolonged car trip to [place]  Referred to hematology for additional anticoagulation recommendations.  When he presented to ER back in June, he was found to have bilateral central pulmonary emboli extending to bilateral upper and lower lobes the segmental and subsegmental levels.  No right heart strain.  No definite pulmonary infarction.  There  is right apical 3.2 cm groundglass airspace opacity versus streak artifact in the setting of adjacent IV contrast.  Subjacent peripheral findings.  Awake groundglass airspace opacity.  Finding could represent infection/inflammation versus developing pulmonary infarction less likely adenocarcinoma.   He was discharged with Xarelto for anticoagulation. He is here for an initial visit.  He apparently was confused with appointment time and arrived  an hour later.  He tells me that he still intermittently has some shortness of breath with exertion but no more chest pain.  No bleeding complaints with Xarelto.  No family history of clotting disorders.  Besides the long trip to [place], he cannot think of any other provoking factors.  No testosterone supplementation that he reports. No COVID-19 infection prior to the PE. Rest of the pertinent 10 point ROS reviewed and negative.  REVIEW OF SYSTEMS:   Constitutional: Denies fevers, chills or abnormal night sweats Eyes: Denies blurriness of vision, double vision or watery eyes Ears, nose, mouth, throat, and face: Denies mucositis or sore throat Respiratory: Denies cough, dyspnea or wheezes Cardiovascular: Denies palpitation, chest discomfort or lower extremity swelling Gastrointestinal:  Denies nausea, heartburn or change in bowel habits Skin: Denies abnormal skin rashes Lymphatics: Denies new lymphadenopathy or easy bruising Neurological:Denies numbness, tingling or new weaknesses Behavioral/Psych: Mood is stable, no new changes  All other systems were reviewed with the patient and are negative.  MEDICAL HISTORY:  Past Medical History:  Diagnosis Date   Asthma    Class 2 severe obesity due to excess calories with serious comorbidity and body mass index (BMI) of 36.0 to 36.9 in adult [facility])    Diabetes mellitus without complication (HCC)     SURGICAL HISTORY: No past surgical history on file.  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   Social Determinants of Health   Financial Resource Strain: Not on file  Food Insecurity: Not on file  Transportation Needs: Not on file  Physical Activity: Not on file  Stress: Not on file  Social Connections: Not on file  Intimate Partner Violence: Not on file    FAMILY HISTORY: Family History  Problem Relation Age of Onset   Diabetes Mellitus I Maternal Grandfather     ALLERGIES:  has No Known Allergies.  MEDICATIONS:  Current Outpatient Medications  Medication Sig Dispense Refill   glipiZIDE (GLUCOTROL) 10 MG tablet Take 1 tablet (10 mg total) by mouth 2 (two) times daily before a meal. 180 tablet 0   glucose blood (TRUE METRIX BLOOD GLUCOSE TEST) test strip 1 each by Other route 2 (two) times daily. Use as instructed 100 each 12   Lancets (BD LANCET ULTRAFINE 30G) MISC 1 each by Does not apply route 2 (two) times daily. 100 each 11   metFORMIN (GLUCOPHAGE) 1000 MG tablet Take 1 tablet (1,000 mg total) by mouth 2 (two)  times daily with a meal. 180 tablet 0   rivaroxaban (XARELTO) 20 MG TABS tablet Take 1 tablet (20 mg total) by mouth daily with supper. 30 tablet 4   SUMAtriptan (IMITREX) 25 MG tablet Take 1 tablet (25 mg total) by mouth at the start of the headache. May repeat in 2 hours x 1 if headache persists. Max of 2 tabs/24 hours. 30 tablet 0   No current facility-administered medications for this visit.    PHYSICAL EXAMINATION: ECOG PERFORMANCE STATUS: 0 - Asymptomatic  Vitals:   04/17/21 1149  BP: 128/74  Pulse: 94  Resp: 18  Temp: 97.8 F (36.6 C)  SpO2: 98%   Filed Weights   04/17/21 1149  Weight: (!) 327 lb 9.6 oz (148.6 kg)   GENERAL:alert, no distress and comfortable SKIN: skin color, texture, turgor are normal, no rashes or significant lesions EYES: normal, conjunctiva are pink and non-injected, sclera clear OROPHARYNX:no exudate, no erythema and  lips, buccal mucosa, and tongue normal  NECK: supple, thyroid normal size, non-tender, without nodularity LYMPH:  no palpable lymphadenopathy in the cervical, axillary or inguinal LUNGS: clear to auscultation and percussion with normal breathing effort HEART: regular rate & rhythm and no murmurs and no lower extremity edema ABDOMEN:abdomen soft, non-tender and normal bowel sounds Musculoskeletal:no cyanosis of digits and no clubbing  PSYCH: alert & oriented x 3 with fluent speech NEURO: no focal motor/sensory deficits  LABORATORY DATA:  I have reviewed the data as listed Lab Results  Component Value Date   WBC 8.2 02/10/2021   HGB 16.3 02/10/2021   HCT 48.3 02/10/2021   MCV 81.5 02/10/2021   PLT 217 02/10/2021     Chemistry      Component Value Date/Time   NA 137 02/10/2021 0121   K 3.5 02/10/2021 0121   CL 98 02/10/2021 0121   CO2 28 02/10/2021 0121   BUN 7 02/10/2021 0121   CREATININE 1.26 (H) 02/10/2021 0121      Component Value Date/Time   CALCIUM 9.7 02/10/2021 0121   ALKPHOS 50 02/05/2021 1932   AST 26 02/05/2021 1932   ALT 39 02/05/2021 1932   BILITOT 0.7 02/05/2021 1932      RADIOGRAPHIC STUDIES: I have personally reviewed the reports as listed and agreed with the findings in the report.  IMPRESSION: 1. Bilateral central pulmonary emboli extending to the bilateral upper and lower lobes at the segmental and subsegmental levels. No right heart strain. No definite pulmonary infarction. 2. Right apical 3.2 cm ground-glass airspace opacity versus streak artifact in the setting of adjacent intravenous contrast. Subjacent peripheral 0.8 cm vague ground-glass airspace opacity. Finding could represent infection/inflammation versus developing pulmonary infarction versus artifact versus less likely adenocarcinoma. Recommend short-term follow-up with cross-sectional imaging as this finding is not visualized on the radiograph. 3. Bilateral trace pleural effusions. 4.  Trace pericardial effusion.  All questions were answered. The patient knows to call the clinic with any problems, questions or concerns. I spent 30 minutes in the care of this patient including H and P, review of records, counseling and coordination of care.     Rachel Moulds, MD 04/17/2021 12:15 PM

## 2021-04-18 ENCOUNTER — Ambulatory Visit: Payer: Medicaid Other | Admitting: Pulmonary Disease

## 2021-04-18 ENCOUNTER — Ambulatory Visit
Admission: RE | Admit: 2021-04-18 | Discharge: 2021-04-18 | Disposition: A | Discharge status: Home / Self Care | Payer: Medicaid Other | Source: Ambulatory Visit | Attending: Pulmonary Disease | Admitting: Pulmonary Disease

## 2021-04-18 DIAGNOSIS — R911 Solitary pulmonary nodule: Secondary | ICD-10-CM

## 2021-04-19 LAB — PROTEIN S ACTIVITY: Protein S Activity: 88 % (ref 63–140)

## 2021-04-19 LAB — LUPUS ANTICOAGULANT PANEL
DRVVT: 43.2 s (ref 0.0–47.0)
PTT Lupus Anticoagulant: 32.8 s (ref 0.0–51.9)

## 2021-04-19 LAB — PROTEIN C ACTIVITY: Protein C Activity: 146 % (ref 73–180)

## 2021-04-20 LAB — BETA-2-GLYCOPROTEIN I ABS, IGG/M/A
Beta-2 Glyco I IgG: <9 GPI IgG units (ref 0–20)
Beta-2-Glycoprotein I IgA: <9 GPI IgA units (ref 0–25)
Beta-2-Glycoprotein I IgM: <9 GPI IgM units (ref 0–32)

## 2021-04-21 ENCOUNTER — Encounter: Payer: Self-pay | Admitting: Family

## 2021-04-21 LAB — CARDIOLIPIN ANTIBODIES, IGG, IGM, IGA
Anticardiolipin IgA: <9 APL U/mL (ref 0–11)
Anticardiolipin IgG: <9 GPL U/mL (ref 0–14)
Anticardiolipin IgM: <9 MPL U/mL (ref 0–12)

## 2021-04-21 LAB — PROTHROMBIN GENE MUTATION

## 2021-04-21 LAB — FACTOR 5 LEIDEN

## 2021-04-23 ENCOUNTER — Encounter: Payer: Self-pay | Admitting: Family

## 2021-04-23 NOTE — Progress Notes (Signed)
Erroneous encounter

## 2021-04-25 LAB — HEXAGONAL PHOSPHOLIPID NEUTRALIZATION: Hexagonal Phospholipid Neutral: 3 s

## 2021-04-28 ENCOUNTER — Encounter: Payer: Medicaid Other | Admitting: Family

## 2021-04-28 DIAGNOSIS — Z1159 Encounter for screening for other viral diseases: Secondary | ICD-10-CM

## 2021-04-28 DIAGNOSIS — Z1329 Encounter for screening for other suspected endocrine disorder: Secondary | ICD-10-CM

## 2021-04-28 DIAGNOSIS — Z130 Encounter for screening for diseases of the blood and blood-forming organs and certain disorders involving the immune mechanism: Secondary | ICD-10-CM

## 2021-04-28 DIAGNOSIS — Z0000 Encounter for general adult medical examination without abnormal findings: Secondary | ICD-10-CM

## 2021-04-28 DIAGNOSIS — Z13228 Encounter for screening for other metabolic disorders: Secondary | ICD-10-CM

## 2021-04-28 DIAGNOSIS — Z13220 Encounter for screening for lipoid disorders: Secondary | ICD-10-CM

## 2021-05-15 ENCOUNTER — Inpatient Hospital Stay: Payer: Medicaid Other | Attending: Hematology and Oncology | Admitting: Hematology and Oncology

## 2021-05-15 ENCOUNTER — Telehealth: Payer: Self-pay | Admitting: *Deleted

## 2021-05-15 NOTE — Telephone Encounter (Signed)
Called pt to f/u on missed appt this am. Pt requested to reschedule. Message sent to scheduling to reschedule appt.

## 2021-07-12 ENCOUNTER — Encounter: Payer: Self-pay | Admitting: Family

## 2021-07-18 ENCOUNTER — Other Ambulatory Visit: Payer: Self-pay | Admitting: Family

## 2021-07-18 DIAGNOSIS — E119 Type 2 diabetes mellitus without complications: Secondary | ICD-10-CM

## 2021-07-18 NOTE — Telephone Encounter (Signed)
A new referral placed to Endocrinology. Their office should call patient within 2 weeks with appointment details.

## 2022-06-04 IMAGING — CT CT CHEST SUPER D W/O CM
1 of 2 series · 15 of 32 positions shown, 19 images · non-contrast
Comparison: CT chest 02/08/2021

CLINICAL DATA: Lung nodule. History of asthma and anticoagulation
for pulmonary embolism. No history of malignancy.

EXAM:
CT CHEST WITHOUT CONTRAST
TECHNIQUE: Multidetector CT imaging of the chest was performed using thin slice
collimation for electromagnetic bronchoscopy planning purposes,
without intravenous contrast.

[Series 6: super d · axial · 0.85mm/px · z∈[-322,-30]mm · 15 of 409 slices shown, 19 images]
[im 22/409  mediastinal]
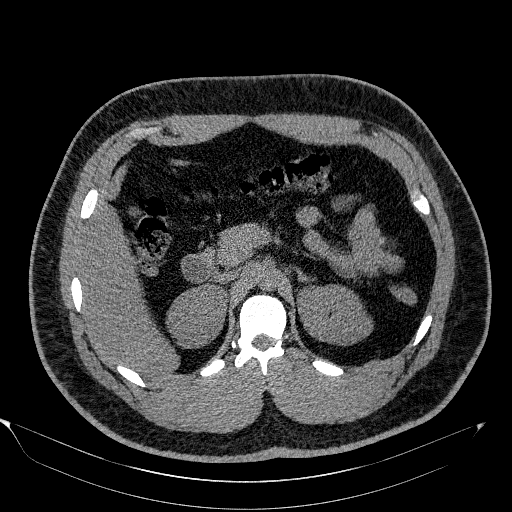
[im 22/409  lung]
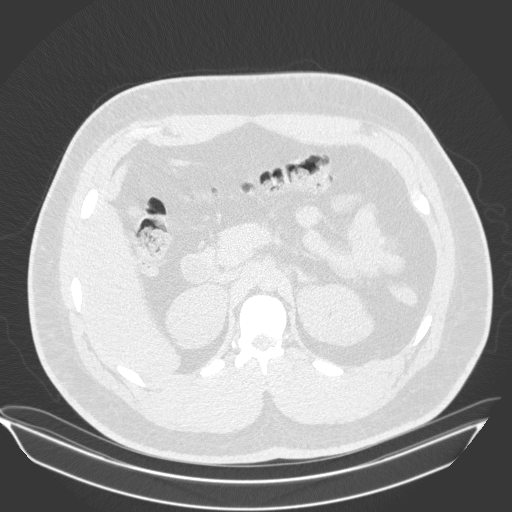
[im 65/409  lung]
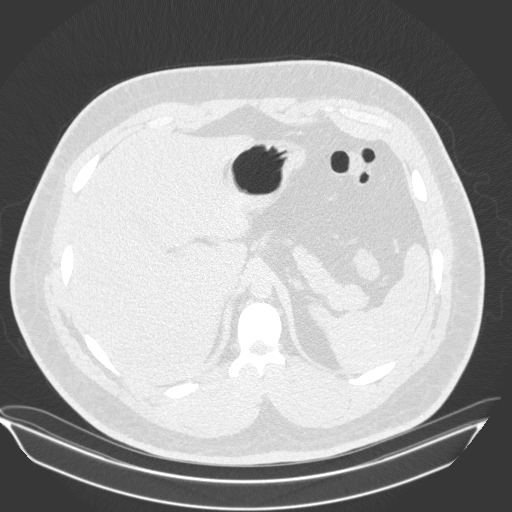
[im 86/409  lung]
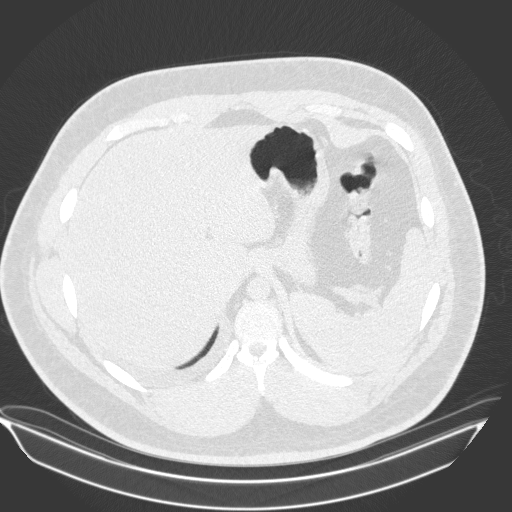
[im 108/409  lung]
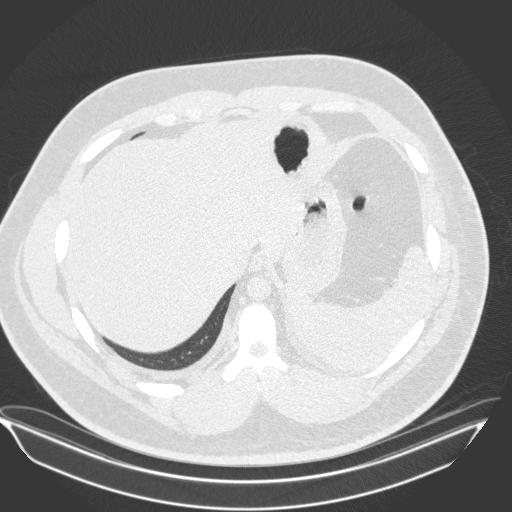
[im 137/409  mediastinal]
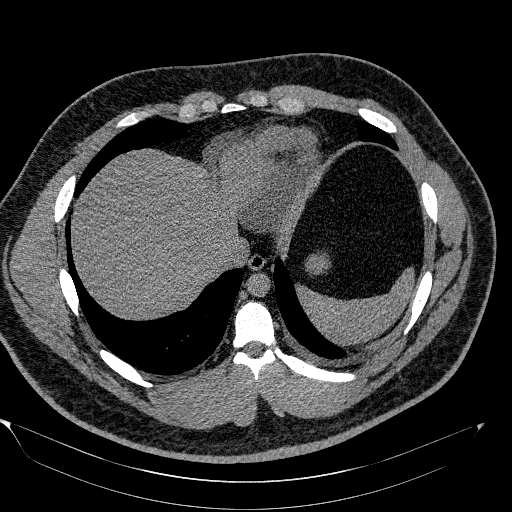
[im 137/409  lung]
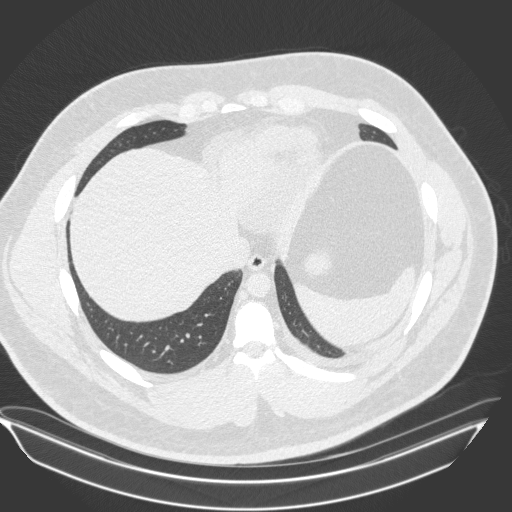
[im 151/409  lung]
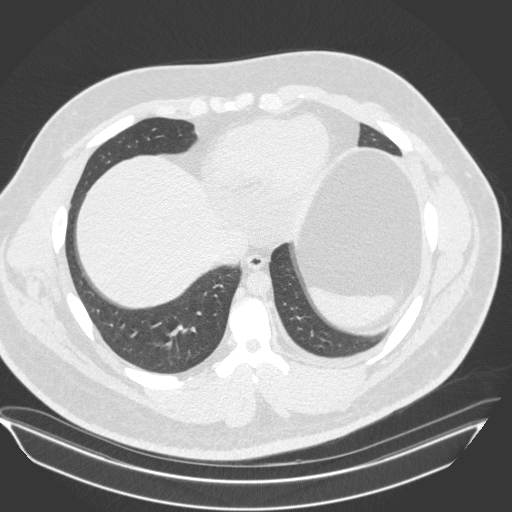
[im 193/409  lung]
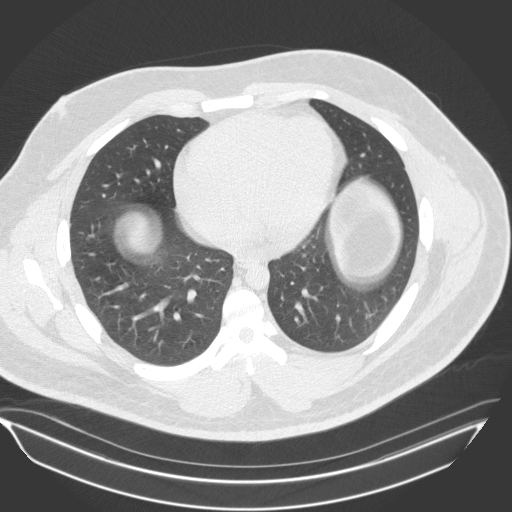
[im 194/409  lung]
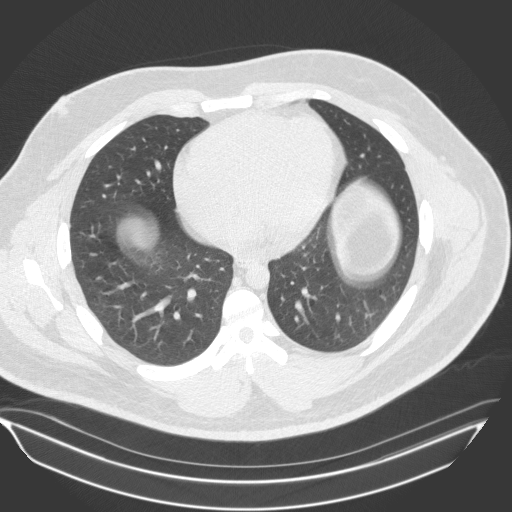
[im 215/409  mediastinal]
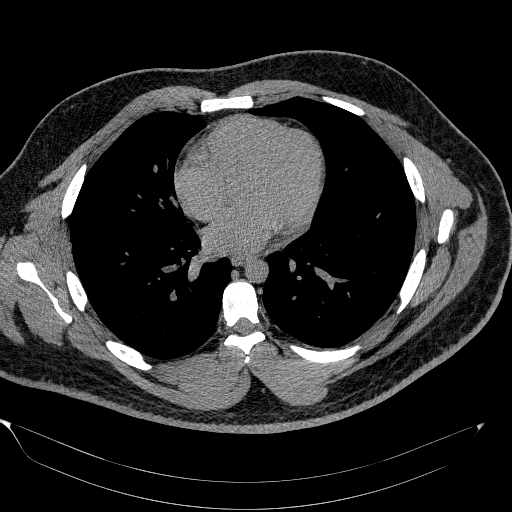
[im 215/409  lung]
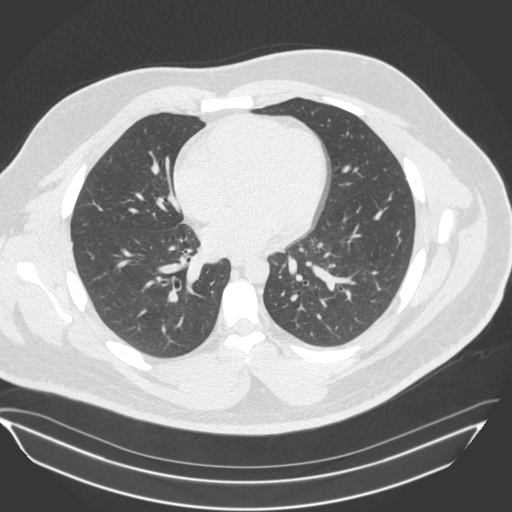
[im 258/409  lung]
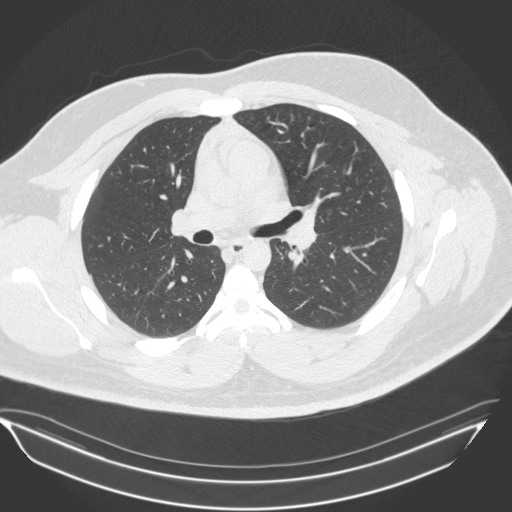
[im 273/409  lung]
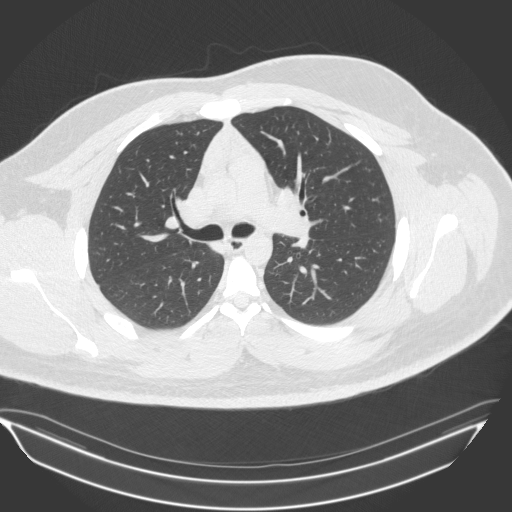
[im 301/409  lung]
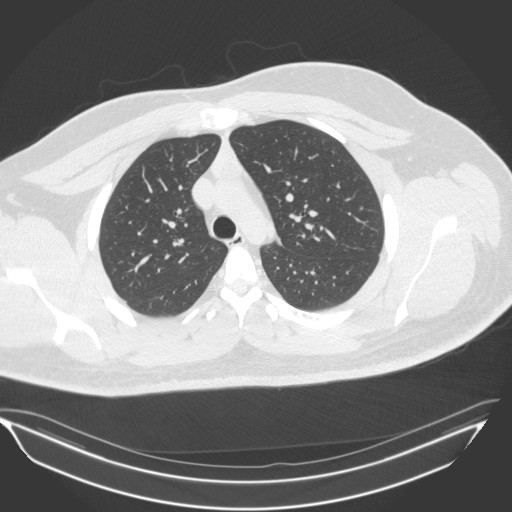
[im 323/409  mediastinal]
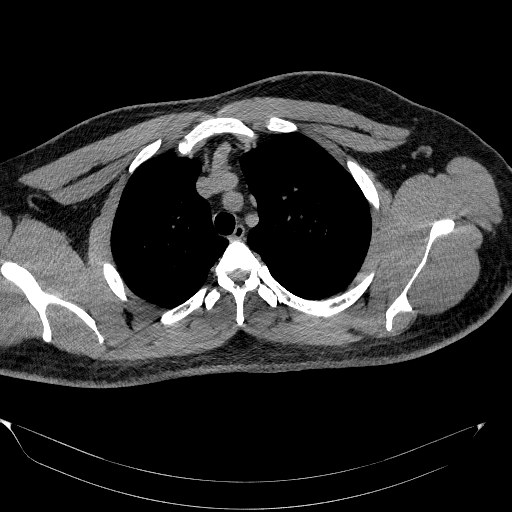
[im 323/409  lung]
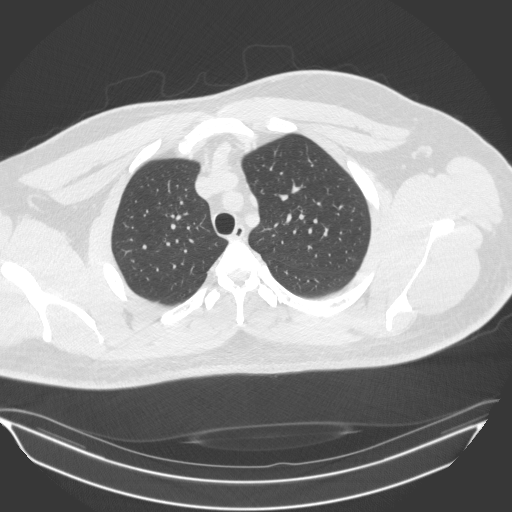
[im 344/409  lung]
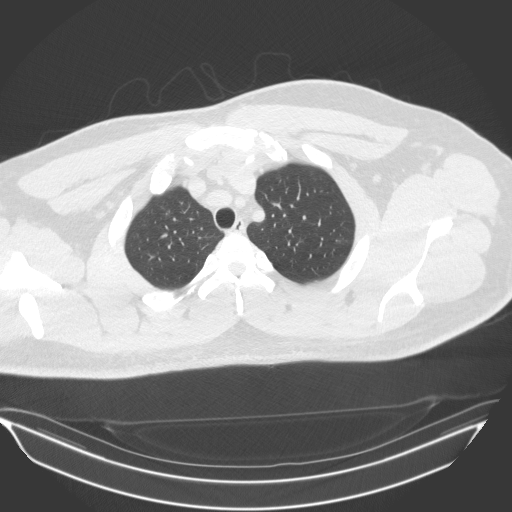
[im 387/409  lung]
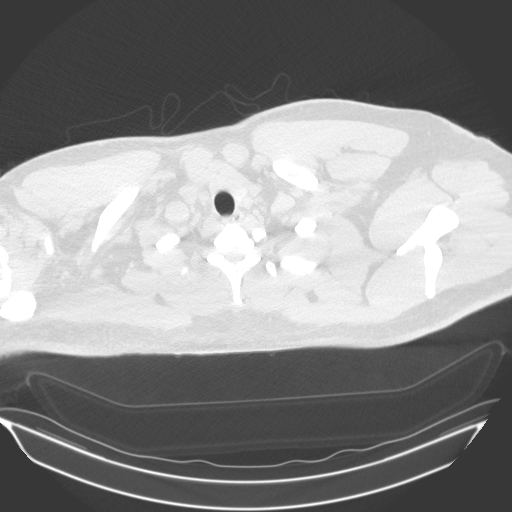

[15 of 32 positions shown; findings below may reference images not displayed]

FINDINGS: Cardiovascular: No acute vascular findings identified on noncontrast
imaging. The central pulmonary arteries do not appear enlarged.
There is a stable small pericardial effusion. The heart size is
normal.

Mediastinum/Nodes: There are no enlarged mediastinal, hilar or
axillary lymph nodes.Hilar assessment is limited by the lack of
intravenous contrast, although the hilar contours appear unchanged.
Stable prominent thymic tissue, typical for age. The thyroid gland,
trachea and esophagus demonstrate no significant findings.

Lungs/Pleura: Trace bilateral pleural effusions, improved. The lungs
are clear without suspicious nodularity. Bibasilar atelectasis has
resolved.

Upper abdomen: Diffusely decreased hepatic density consistent with
steatosis. No acute findings.

Musculoskeletal/Chest wall: There is no chest wall mass or
suspicious osseous finding. Mild kyphoscoliosis.
IMPRESSION: 1. Improvement in previously demonstrated small pleural effusions
attributed to pulmonary embolism on previous study.
2. Interval improved aeration of the lung bases. No suspicious
pulmonary nodularity.
3. No acute chest findings on noncontrast imaging.

## 2022-07-01 ENCOUNTER — Emergency Department: Payer: BC Managed Care – PPO

## 2022-07-01 ENCOUNTER — Emergency Department
Admission: EM | Admit: 2022-07-01 | Discharge: 2022-07-01 | Disposition: A | Discharge status: Home / Self Care | Payer: BC Managed Care – PPO | Attending: Emergency Medicine | Admitting: Emergency Medicine

## 2022-07-01 DIAGNOSIS — J0120 Acute ethmoidal sinusitis, unspecified: Secondary | ICD-10-CM | POA: Insufficient documentation

## 2022-07-01 DIAGNOSIS — J0130 Acute sphenoidal sinusitis, unspecified: Secondary | ICD-10-CM | POA: Insufficient documentation

## 2022-07-01 DIAGNOSIS — G43909 Migraine, unspecified, not intractable, without status migrainosus: Secondary | ICD-10-CM | POA: Insufficient documentation

## 2022-07-01 HISTORY — DX: Type 2 diabetes mellitus without complications: E11.9

## 2022-07-01 LAB — CBC AND DIFFERENTIAL
Absolute NRBC: 0 10*3/uL (ref 0.00–0.00)
Basophils Absolute Automated: 0.05 10*3/uL (ref 0.00–0.08)
Basophils Automated: 0.6 %
Eosinophils Absolute Automated: 0.39 10*3/uL (ref 0.00–0.44)
Eosinophils Automated: 4.9 %
Hematocrit: 43.1 % (ref 37.6–49.6)
Hgb: 14.8 g/dL (ref 12.5–17.1)
Immature Granulocytes Absolute: 0.02 10*3/uL (ref 0.00–0.07)
Immature Granulocytes: 0.3 %
Instrument Absolute Neutrophil Count: 4.34 10*3/uL (ref 1.10–6.33)
Lymphocytes Absolute Automated: 2.66 10*3/uL (ref 0.42–3.22)
Lymphocytes Automated: 33.5 %
MCH: 27.7 pg (ref 25.1–33.5)
MCHC: 34.3 g/dL (ref 31.5–35.8)
MCV: 80.6 fL (ref 78.0–96.0)
MPV: 10.2 fL (ref 8.9–12.5)
Monocytes Absolute Automated: 0.49 10*3/uL (ref 0.21–0.85)
Monocytes: 6.2 %
Neutrophils Absolute: 4.34 10*3/uL (ref 1.10–6.33)
Neutrophils: 54.5 %
Nucleated RBC: 0 /100 WBC (ref 0.0–0.0)
Platelets: 247 10*3/uL (ref 142–346)
RBC: 5.35 10*6/uL (ref 4.20–5.90)
RDW: 13 % (ref 11–15)
WBC: 7.95 10*3/uL (ref 3.10–9.50)

## 2022-07-01 LAB — COMPREHENSIVE METABOLIC PANEL
ALT: 48 U/L (ref 0–55)
AST (SGOT): 42 U/L — ABNORMAL HIGH (ref 5–41)
Albumin/Globulin Ratio: 1.3 (ref 0.9–2.2)
Albumin: 4.1 g/dL (ref 3.5–5.0)
Alkaline Phosphatase: 57 U/L (ref 37–117)
Anion Gap: 10 (ref 5.0–15.0)
BUN: 12 mg/dL (ref 9.0–28.0)
Bilirubin, Total: 0.5 mg/dL (ref 0.2–1.2)
CO2: 27 mEq/L (ref 17–29)
Calcium: 10.1 mg/dL (ref 8.5–10.5)
Chloride: 100 mEq/L (ref 99–111)
Creatinine: 1.2 mg/dL (ref 0.5–1.5)
Globulin: 3.1 g/dL (ref 2.0–3.6)
Glucose: 111 mg/dL — ABNORMAL HIGH (ref 70–100)
Potassium: 3.9 mEq/L (ref 3.5–5.3)
Protein, Total: 7.2 g/dL (ref 6.0–8.3)
Sodium: 137 mEq/L (ref 135–145)
eGFR: >60 mL/min/{1.73_m2} (ref >=60)

## 2022-07-01 MED ORDER — DIPHENHYDRAMINE HCL 50 MG/ML IJ SOLN
12.5000 mg | Freq: Once | INTRAMUSCULAR | Status: AC
Start: 2022-07-01 — End: 2022-07-01
  Administered 2022-07-01: 12.5 mg via INTRAVENOUS
  Filled 2022-07-01: qty 1

## 2022-07-01 MED ORDER — METOCLOPRAMIDE HCL 5 MG/ML IJ SOLN
10.0000 mg | Freq: Once | INTRAMUSCULAR | Status: AC
Start: 2022-07-01 — End: 2022-07-01
  Administered 2022-07-01: 10 mg via INTRAVENOUS
  Filled 2022-07-01: qty 2

## 2022-07-01 MED ORDER — BUTALBITAL-APAP-CAFFEINE 50-325-40 MG PO TABS
1.0000 | ORAL_TABLET | ORAL | 0 refills | Status: DC | PRN
Start: 2022-07-01 — End: 2022-09-05

## 2022-07-01 MED ORDER — SODIUM CHLORIDE 0.9 % IV BOLUS
1000.0000 mL | Freq: Once | INTRAVENOUS | Status: AC
Start: 2022-07-01 — End: 2022-07-01
  Administered 2022-07-01: 1000 mL via INTRAVENOUS

## 2022-07-01 MED ORDER — DEXAMETHASONE SOD PHOSPHATE PF 10 MG/ML IJ SOLN
10.0000 mg | Freq: Once | INTRAMUSCULAR | Status: AC
Start: 2022-07-01 — End: 2022-07-01
  Administered 2022-07-01: 10 mg via INTRAVENOUS
  Filled 2022-07-01: qty 1

## 2022-07-01 MED ORDER — AMOXICILLIN-POT CLAVULANATE 875-125 MG PO TABS
1.0000 | ORAL_TABLET | Freq: Two times a day (BID) | ORAL | 0 refills | Status: DC
Start: 2022-07-01 — End: 2022-07-11

## 2022-07-01 MED ORDER — METHYLPREDNISOLONE 4 MG PO TBPK
ORAL_TABLET | ORAL | 0 refills | Status: DC
Start: 2022-07-01 — End: 2022-07-11

## 2022-07-01 MED ORDER — SUMATRIPTAN SUCCINATE 6 MG/0.5ML SC SOAJ
6.0000 mg | Freq: Once | SUBCUTANEOUS | Status: AC
Start: 2022-07-01 — End: 2022-07-01
  Administered 2022-07-01: 6 mg via SUBCUTANEOUS
  Filled 2022-07-01: qty 0.5

## 2022-07-01 MED ORDER — KETOROLAC TROMETHAMINE 30 MG/ML IJ SOLN
30.0000 mg | Freq: Once | INTRAMUSCULAR | Status: AC
Start: 2022-07-01 — End: 2022-07-01
  Administered 2022-07-01: 30 mg via INTRAVENOUS
  Filled 2022-07-01: qty 1

## 2022-07-01 MED ORDER — STERILE WATER FOR INJECTION IJ/IV SOLN (WRAP)
1.0000 g | Freq: Once | INTRAMUSCULAR | Status: AC
Start: 2022-07-01 — End: 2022-07-01
  Administered 2022-07-01: 1 g via INTRAVENOUS
  Filled 2022-07-01: qty 1000

## 2022-07-01 MED ORDER — ONDANSETRON HCL 4 MG/2ML IJ SOLN
4.0000 mg | Freq: Once | INTRAMUSCULAR | Status: AC
Start: 2022-07-01 — End: 2022-07-01
  Administered 2022-07-01: 4 mg via INTRAVENOUS
  Filled 2022-07-01: qty 2

## 2022-07-01 MED ORDER — MORPHINE SULFATE 4 MG/ML IJ/IV SOLN (WRAP)
4.0000 mg | Freq: Once | Status: AC
Start: 2022-07-01 — End: 2022-07-01
  Administered 2022-07-01: 4 mg via INTRAVENOUS
  Filled 2022-07-01: qty 1

## 2022-07-01 NOTE — ED Provider Notes (Signed)
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  Has had headaches like this before, but usually resolve within a day with over the counter meds.  He has never been worked up for his headaches.  He denies fever or stiff neck.  No neuro deficits such as facial droop, speech or vision problems, numbness, weakness or tingling. He is able to walk. Pain is 10 and not improved by OTC meds.  Nothing makes it better.  Light hurts his eyes and sounds bother him.  He denies migraine history in him or family.  He denies aura sxs.  No trauma or head injury. No vision problems, nasal drainage or facial pain.         Past Medical History:   Diagnosis Date    DM (diabetes mellitus)        History reviewed. No pertinent surgical history.    History reviewed. No pertinent family history.    Social  Social History     Tobacco Use    Smoking status: Unknown   Vaping Use    Vaping Use: Never used   Substance Use Topics    Alcohol use: Never    Drug use: Never       .     No Known Allergies    Home Medications       Med List Status: In Progress Set By: Cira Servant, RN at 07/01/2022  8:06 PM              insulin glargine 100 UNIT/ML pen-injector     Inject into the skin nightly             Review of Systems   Constitutional:  Negative for chills and fever.   HENT:  Negative for sinus pressure, sinus pain and sore throat.    Eyes:  Positive for photophobia. Negative for discharge, redness and visual disturbance.   Respiratory:  Negative for cough and shortness of breath.    Cardiovascular:  Negative for chest pain, palpitations and leg swelling.   Gastrointestinal:  Positive for nausea. Negative for abdominal pain, diarrhea and vomiting.   Genitourinary:  Negative for dysuria and frequency.   Musculoskeletal:  Negative for neck stiffness.    Skin:  Negative for pallor and rash.   Neurological:  Positive for headaches. Negative for dizziness, tremors, seizures, syncope, facial asymmetry, speech difficulty, weakness, light-headedness and numbness.   Psychiatric/Behavioral:  Negative for agitation. The patient is not nervous/anxious.    All other systems reviewed and are negative.      Physical Exam    BP: 140/66, Heart Rate: 76, Temp: 98.3 F (36.8 C), Resp Rate: 17, SpO2: 100 %, Weight: 146.3 kg    Physical Exam  Vitals and nursing note (Elevated BMI) reviewed.   Constitutional:       General: He is not in acute distress.     Appearance: Normal appearance. He is well-developed. He is not ill-appearing or diaphoretic.   HENT:      Head: Normocephalic and atraumatic.      Comments: No sinus tenderness, no facial droop, EOMI without diplopia or nystagmus     Right Ear: External ear normal.      Left Ear: External ear normal.  Nose: Nose normal. No rhinorrhea.      Mouth/Throat:      Pharynx: Oropharynx is clear. No oropharyngeal exudate or posterior oropharyngeal erythema.   Eyes:      General: No scleral icterus.        Right eye: No discharge.         Left eye: No discharge.      Extraocular Movements: Extraocular movements intact.      Conjunctiva/sclera: Conjunctivae normal.      Pupils: Pupils are equal, round, and reactive to light.   Neck:      Vascular: No JVD.      Comments: Nontender  Cardiovascular:      Rate and Rhythm: Normal rate and regular rhythm.      Heart sounds: Normal heart sounds. No murmur heard.     No friction rub. No gallop.   Pulmonary:      Effort: Pulmonary effort is normal. No respiratory distress.      Breath sounds: Normal breath sounds. No stridor. No wheezing or rales.   Chest:      Chest wall: No tenderness.   Abdominal:      Palpations: Abdomen is soft.      Tenderness: There is no abdominal tenderness. There is no guarding or rebound.   Musculoskeletal:         General: No tenderness or deformity. Normal range of  motion.      Cervical back: Normal range of motion. No rigidity or tenderness.      Right lower leg: No edema.      Left lower leg: No edema.   Lymphadenopathy:      Cervical: No cervical adenopathy.   Skin:     General: Skin is warm and dry.      Coloration: Skin is not pale.      Findings: No erythema or rash.   Neurological:      General: No focal deficit present.      Mental Status: He is alert and oriented to person, place, and time.      Cranial Nerves: No cranial nerve deficit.      Sensory: No sensory deficit.      Motor: No weakness or abnormal muscle tone.      Coordination: Coordination normal.      Gait: Gait normal.   Psychiatric:         Mood and Affect: Mood normal.         Behavior: Behavior normal.         Thought Content: Thought content normal.         Judgment: Judgment normal.           MDM and ED Course     ED Medication Orders (From admission, onward)      Start Ordered     Status Ordering Provider    07/01/22 2215 07/01/22 2200  dexAMETHasone (PF) (DECADRON) 10 mg/mL injection 10 mg  Once        Route: Intravenous  Ordered Dose: 10 mg       Last MAR action: Given Tatijana Bierly V    07/01/22 2215 07/01/22 2209  SUMAtriptan succinate (IMITREX) injection 6 mg  Once        Route: Subcutaneous  Ordered Dose: 6 mg       Last MAR action: Given Inna Tisdell V    07/01/22 2130 07/01/22 2130  cefTRIAXone (ROCEPHIN) 1 g in sterile water (preservative free) 10 mL IV push injection  Once        Route: Intravenous  Ordered Dose: 1 g       Last MAR action: Given Chieko Neises V    07/01/22 2030 07/01/22 2021  morphine injection 4 mg  Once        Route: Intravenous  Ordered Dose: 4 mg       Last MAR action: Given Janifer Gieselman V    07/01/22 2030 07/01/22 2021  ketorolac (TORADOL) injection 30 mg  Once        Route: Intravenous  Ordered Dose: 30 mg       Last MAR action: Given Aynsley Fleet V    07/01/22 2030 07/01/22 2021  sodium chloride 0.9 % bolus 1,000 mL  Once        Route: Intravenous  Ordered  Dose: 1,000 mL       Last MAR action: Stopped Emmalynne Courtney V    07/01/22 2030 07/01/22 2021  ondansetron (ZOFRAN) injection 4 mg  Once        Route: Intravenous  Ordered Dose: 4 mg       Last MAR action: Given Mccormick Macon V    07/01/22 2030 07/01/22 2021  metoclopramide (REGLAN) injection 10 mg  Once        Route: Intravenous  Ordered Dose: 10 mg       Last MAR action: Given Dymon Summerhill V    07/01/22 2030 07/01/22 2021  diphenhydrAMINE (BENADRYL) injection 12.5 mg  Once        Route: Intravenous  Ordered Dose: 12.5 mg       Last MAR action: Given Cheng Dec V               Medical Decision Making  Pt gradually more comfortable after meds.  Pain decreased to an 8 then 6 after initial regimen.  Then decreased to a 3 after Imitrex.  CT scan findings reviewed with father and patient.  Case discussed with ENT, Dr. Alyse Low, who reviewed the scans.  Pt was given IV Rocephin in ED and Decadron.  Plan with ENT is to discharge with Augmentin, Medrol Dose pack and follow up in 1-2 days.  Pt and father understand.  Pt is much more comfortable with pain a 3 and feels he can go home.  No evidence of sinus thrombosis or meningitis.  The sinusitis may have triggered an acute migraine, findings on CT may be chronic.  Will need ENT evaluation.  Pt is safe for discharge. Discussed option of admission with father and pt, but they feel comfortable going home.  Will prescribe Fioricet for headache pain.    Results for orders placed or performed during the hospital encounter of 07/01/22  -CBC and differential:        Result                                            Value                         Ref Range                       WBC  7.95                          3.10 - 9.50 x10 3/uL            Hgb                                               14.8                          12.5 - 17.1 g/dL                Hematocrit                                        43.1                           37.6 - 49.6 %                   Platelets                                         247                           142 - 346 x10 3/uL              RBC                                               5.35                          4.20 - 5.90 x10 6/uL            MCV                                               80.6                          78.0 - 96.0 fL                  MCH                                               27.7                          25.1 - 33.5 pg                  MCHC  34.3                          31.5 - 35.8 g/dL                RDW                                               13                            11 - 15 %                       MPV                                               10.2                          8.9 - 12.5 fL                   Instrument Absolute Neutrophil Count              4.34                          1.10 - 6.33 x10 3/uL            Neutrophils                                       54.5                          None %                          Lymphocytes Automated                             33.5                          None %                          Monocytes                                         6.2                           None %                          Eosinophils Automated  4.9                           None %                          Basophils Automated                               0.6                           None %                          Immature Granulocytes                             0.3                           None %                          Nucleated RBC                                     0.0                           0.0 - 0.0 /100 WBC              Neutrophils Absolute                              4.34                          1.10 - 6.33 x10 3/uL            Lymphocytes Absolute Automated                    2.66                          0.42 - 3.22 x10 3/uL            Monocytes  Absolute Automated                      0.49                          0.21 - 0.85 x10 3/uL            Eosinophils Absolute Automated                    0.39                          0.00 - 0.44 x10 3/uL            Basophils Absolute Automated                      0.05  0.00 - 0.08 x10 3/uL            Immature Granulocytes Absolute                    0.02                          0.00 - 0.07 x10 3/uL            Absolute NRBC                                     0.00                          0.00 - 0.00 x10 3/uL       -Comprehensive metabolic panel:        Result                                            Value                         Ref Range                       Glucose                                           111 (H)                       70 - 100 mg/dL                  BUN                                               12.0                          9.0 - 28.0 mg/dL                Creatinine                                        1.2                           0.5 - 1.5 mg/dL                 Sodium                                            137                           135 - 145 mEq/L  Potassium                                         3.9                           3.5 - 5.3 mEq/L                 Chloride                                          100                           99 - 111 mEq/L                  CO2                                               27                            17 - 29 mEq/L                   Calcium                                           10.1                          8.5 - 10.5 mg/dL                Protein, Total                                    7.2                           6.0 - 8.3 g/dL                  Albumin                                           4.1                           3.5 - 5.0 g/dL                  AST (SGOT)                                        42 (H)  5 - 41 U/L                      ALT                                                48                            0 - 55 U/L                      Alkaline Phosphatase                              57                            37 - 117 U/L                    Bilirubin, Total                                  0.5                           0.2 - 1.2 mg/dL                 Globulin                                          3.1                           2.0 - 3.6 g/dL                  Albumin/Globulin Ratio                            1.3                           0.9 - 2.2                       Anion Gap                                         10.0                          5.0 - 15.0                      eGFR                                              >60.0                         >=  60 mL/min/1.73 m2           Results for orders placed or performed during the hospital encounter of 07/01/22  -CT Head WO Contrast:                                                                                                                     Narrative    HISTORY: Headache, chronic, new features or increased frequency        COMPARISON: None available.        TECHNIQUE: CT of the head performed without intravenous contrast. The    following dose reduction techniques were utilized: automated exposure    control and/or adjustment of the mA and/or KV according to patient size,    and the use of an iterative reconstruction technique.        CONTRAST: None.        FINDINGS:        The ventricles are normal in configuration. No intracranial hemorrhage,    mass-effect or midline shift is present. No extra-axial fluid collections    are seen. There is no CT evidence of an acute cortical infarct. The    parenchyma is unremarkable in attenuation.         The calvarium is intact. There is complete opacification of the posterior    right-sided ethmoid air cells with near complete opacification of the right    sphenoid sinus by membrane thickening and fluid which exhibits    heterogeneous hyperattenuation.  This is nonspecific and can be seen in the    setting of inspissated, protein-rich secretions and/or superimposed fungal    infection. This irregular soft tissue extends into the posterior-superior    aspect of the right nasal cavity near the nasal choana. The balance of the    visualized paranasal sinuses, middle ear cavities and mastoid air cells are    well-pneumatized and clear. The orbital contents are normal. Adenoidal    hypertrophy is present.                                                                                                                        Impression             1.No CT evidence of acute intracranial abnormality.     2.Right sphenoid and posterior ethmoid sinusitis for which otolaryngology    follow-up consultation is advised. An obstructive lesion at the right    sphenoethmoidal recess is not excluded.  Waynard Edwardshristian Muller, MD    07/01/2022 9:04 PM     Number and Complexity of Problems Addressed (select at least one)    Complexity: High: 1 acute or chronic illness or injury that poses a threat to life or bodily function      Presenting acute/chronic problems: Headache    Differential diagnosis to include but not limited to: Migraine, tension headache, SAH, sinusitis, meningitis, tumor.    Chronic illness impacting care and increasing risk of acute/chronic problems (obesity based on bmi >30, diabetes, hypertension, elderly - 65 or older): Elevated BMI and diabetes.    ______________________________________________________________________  Amount and Complexity of Data Reviewed   L\  History obtained from another historian -see HPI or here (parent, spouse,  care giver, ems) : Father      I, Lorri FrederickEdward Zael Shuman, MD, have been the primary provider for [name] during this Emergency Dept visit.     Oxygen saturation by pulse oximetry is 95%-100%, Normal.  Interventions: None Needed.         Independent visualized and interpretation of radiological study by me:   Type of radiological study  performed : CT  Independent Interpretation by me: Opacification or sphenoid and ethmoid sinuses.    Diagnostic tests appropriately considered even if not ultimately performed: MRI    Discussion of management with other physicians and/or other caretakers:   Discussion with  ENT. Patient condition and all pertinent labs and/or radiology studies discussed.   __________________________________________________________________    Risk of Complications and/or Morbidity or Mortality of Patient Management  (select one if applicable)    Risk: Decision regarding hospitalization or escalation of hospital level of care        Amount and/or Complexity of Data Reviewed  Labs: ordered.  Radiology: ordered.    Risk  Prescription drug management.                     Procedures    Clinical Impression & Disposition     Clinical Impression  Final diagnoses:   Acute sphenoidal sinusitis, recurrence not specified   Acute ethmoidal sinusitis, recurrence not specified   Migraine without status migrainosus, not intractable, unspecified migraine type        ED Disposition       ED Disposition   Discharge    Condition   --    Date/Time   Sun Jul 01, 2022 11:08 PM    Comment   [name] discharge to home/self care.    Condition at disposition: Stable                  Discharge Medication List as of 07/01/2022 11:18 PM        START taking these medications    Details   amoxicillin-clavulanate (AUGMENTIN) 875-125 MG per tablet Take 1 tablet by mouth 2 (two) times daily for 10 days, Starting Sun 07/01/2022, Until Wed 07/11/2022, E-Rx      butalbital-acetaminophen-caffeine (FIORICET) 50-325-40 MG per tablet Take 1 tablet by mouth every 4 (four) hours as needed for Headaches, Starting Sun 07/01/2022, E-Rx      methylPREDNISolone (MEDROL DOSEPAK) 4 MG tablet Use as directed, Normal                         Talmadge ChadPuccio, Sahiti Joswick V, MD  07/02/22 1527

## 2022-07-01 NOTE — Discharge Instructions (Signed)
You have evidence of a Sphenoid and Ethmoid Sinusitis on your CT scan and need to see an ENT doctor for further evaluation and treatment.  We will start antibiotics and steroids for your sinusitis.  The sinusitis seems to have triggered a migraine headache for which you were treated.  You may take the Fioricet for headache pain.  No driving on Fioricet.  The steroids may cause your blood sugar to increase.  Check you blood sugar 4 times daily and record. Return to the ER if your blood sugar is over 300.  Your blood sugar will improve as you wean off the steroids.  Return if worse pain, fever or neurologic symptoms.

## 2022-07-05 ENCOUNTER — Emergency Department: Payer: BC Managed Care – PPO

## 2022-07-05 ENCOUNTER — Emergency Department
Admission: EM | Admit: 2022-07-05 | Discharge: 2022-07-05 | Disposition: A | Discharge status: Home / Self Care | Payer: BC Managed Care – PPO | Attending: Emergency Medicine | Admitting: Emergency Medicine

## 2022-07-05 DIAGNOSIS — J0120 Acute ethmoidal sinusitis, unspecified: Secondary | ICD-10-CM | POA: Insufficient documentation

## 2022-07-05 DIAGNOSIS — R519 Headache, unspecified: Secondary | ICD-10-CM

## 2022-07-05 DIAGNOSIS — R030 Elevated blood-pressure reading, without diagnosis of hypertension: Secondary | ICD-10-CM | POA: Insufficient documentation

## 2022-07-05 DIAGNOSIS — Z20822 Contact with and (suspected) exposure to covid-19: Secondary | ICD-10-CM | POA: Insufficient documentation

## 2022-07-05 LAB — COVID-19 (SARS-COV-2) & INFLUENZA  A/B, NAA (ROCHE LIAT)
Influenza A: NOT DETECTED
Influenza B: NOT DETECTED
SARS CoV 2 Overall Result: NOT DETECTED

## 2022-07-05 LAB — BASIC METABOLIC PANEL
Anion Gap: 8 (ref 5.0–15.0)
BUN: 13 mg/dL (ref 9.0–28.0)
CO2: 26 mEq/L (ref 17–29)
Calcium: 9.3 mg/dL (ref 8.5–10.5)
Chloride: 104 mEq/L (ref 99–111)
Creatinine: 1.2 mg/dL (ref 0.5–1.5)
Glucose: 271 mg/dL — ABNORMAL HIGH (ref 70–100)
Potassium: 4.2 mEq/L (ref 3.5–5.3)
Sodium: 138 mEq/L (ref 135–145)
eGFR: >60 mL/min/{1.73_m2} (ref >=60)

## 2022-07-05 LAB — CBC AND DIFFERENTIAL
Absolute NRBC: 0 10*3/uL (ref 0.00–0.00)
Basophils Absolute Automated: 0.04 10*3/uL (ref 0.00–0.08)
Basophils Automated: 0.4 %
Eosinophils Absolute Automated: 0.22 10*3/uL (ref 0.00–0.44)
Eosinophils Automated: 2.1 %
Hematocrit: 41.2 % (ref 37.6–49.6)
Hgb: 14.6 g/dL (ref 12.5–17.1)
Immature Granulocytes Absolute: 0.03 10*3/uL (ref 0.00–0.07)
Immature Granulocytes: 0.3 %
Instrument Absolute Neutrophil Count: 8.05 10*3/uL — ABNORMAL HIGH (ref 1.10–6.33)
Lymphocytes Absolute Automated: 1.68 10*3/uL (ref 0.42–3.22)
Lymphocytes Automated: 16.2 %
MCH: 28 pg (ref 25.1–33.5)
MCHC: 35.4 g/dL (ref 31.5–35.8)
MCV: 79.1 fL (ref 78.0–96.0)
MPV: 10.1 fL (ref 8.9–12.5)
Monocytes Absolute Automated: 0.37 10*3/uL (ref 0.21–0.85)
Monocytes: 3.6 %
Neutrophils Absolute: 8.05 10*3/uL — ABNORMAL HIGH (ref 1.10–6.33)
Neutrophils: 77.4 %
Nucleated RBC: 0 /100 WBC (ref 0.0–0.0)
Platelets: 217 10*3/uL (ref 142–346)
RBC: 5.21 10*6/uL (ref 4.20–5.90)
RDW: 12 % (ref 11–15)
WBC: 10.39 10*3/uL — ABNORMAL HIGH (ref 3.10–9.50)

## 2022-07-05 LAB — PT/INR
PT INR: 1.1 (ref 0.9–1.1)
PT: 12.2 s (ref 10.1–12.9)

## 2022-07-05 MED ORDER — DEXAMETHASONE SOD PHOSPHATE PF 10 MG/ML IJ SOLN
10.0000 mg | Freq: Once | INTRAMUSCULAR | Status: AC
Start: 2022-07-05 — End: 2022-07-05
  Administered 2022-07-05: 10 mg via INTRAVENOUS
  Filled 2022-07-05: qty 1

## 2022-07-05 MED ORDER — PROCHLORPERAZINE EDISYLATE 10 MG/2ML IJ SOLN
10.0000 mg | Freq: Once | INTRAMUSCULAR | Status: AC
Start: 2022-07-05 — End: 2022-07-05
  Administered 2022-07-05: 10 mg via INTRAVENOUS
  Filled 2022-07-05: qty 2

## 2022-07-05 MED ORDER — RIZATRIPTAN BENZOATE 5 MG PO TABS
10.0000 mg | ORAL_TABLET | ORAL | 0 refills | Status: DC | PRN
Start: 2022-07-05 — End: 2022-09-05

## 2022-07-05 MED ORDER — RIZATRIPTAN BENZOATE 5 MG PO TABS
5.0000 mg | ORAL_TABLET | ORAL | 0 refills | Status: DC | PRN
Start: 2022-07-05 — End: 2022-07-05

## 2022-07-05 MED ORDER — KETOROLAC TROMETHAMINE 30 MG/ML IJ SOLN
30.0000 mg | Freq: Once | INTRAMUSCULAR | Status: AC
Start: 2022-07-05 — End: 2022-07-05
  Administered 2022-07-05: 30 mg via INTRAVENOUS
  Filled 2022-07-05: qty 1

## 2022-07-05 MED ORDER — IOHEXOL 350 MG/ML IV SOLN
80.0000 mL | Freq: Once | INTRAVENOUS | Status: AC | PRN
Start: 2022-07-05 — End: 2022-07-05
  Administered 2022-07-05: 80 mL via INTRAVENOUS

## 2022-07-05 MED ORDER — HALOPERIDOL LACTATE 5 MG/ML IJ SOLN
5.0000 mg | Freq: Once | INTRAMUSCULAR | Status: DC
Start: 2022-07-05 — End: 2022-07-05
  Filled 2022-07-05: qty 1

## 2022-07-05 MED ORDER — ACETAMINOPHEN 500 MG PO TABS
1000.0000 mg | ORAL_TABLET | Freq: Once | ORAL | Status: AC
Start: 2022-07-05 — End: 2022-07-05
  Administered 2022-07-05: 1000 mg via ORAL
  Filled 2022-07-05: qty 2

## 2022-07-05 MED ORDER — SODIUM CHLORIDE 0.9 % IV BOLUS
1000.0000 mL | Freq: Once | INTRAVENOUS | Status: AC
Start: 2022-07-05 — End: 2022-07-05
  Administered 2022-07-05: 1000 mL via INTRAVENOUS

## 2022-07-05 MED ORDER — DIPHENHYDRAMINE HCL 50 MG/ML IJ SOLN
25.0000 mg | Freq: Once | INTRAMUSCULAR | Status: AC
Start: 2022-07-05 — End: 2022-07-05
  Administered 2022-07-05: 25 mg via INTRAVENOUS
  Filled 2022-07-05: qty 1

## 2022-07-05 NOTE — Discharge Instructions (Addendum)
Please follow-up with your primary care provider within 48 hours for reassessment and continued care.  In addition, please keep your pending appoint with neurology and ENT (otolaryngology).  Please seek any medical attention for worsening symptoms, severe headache, vision change, slurred speech, numbness/tingling/weakness, lightheadedness, passing out, inability keep fluids down or for any other concerns.  Please continue antibiotic course as previously prescribed.  I recommend cool compresses, over-the-counter medications and rest.  Thank you!

## 2022-07-05 NOTE — ED Notes (Signed)
Per patients father, would like to have CT scan done. EDMD notified

## 2022-07-05 NOTE — ED Provider Notes (Signed)
Chief Complaint   Patient presents with    Headache         History Provided by: patient     Additional History: [name] is a 21 y.o. male with a history of  has a past medical history of DM (diabetes mellitus)., presenting to the ED with the complaint of headache over the last 3 weeks.  Patient states that headache has waxed and waned but never fully resolved.  Patient was seen for the same 4 days ago.  After treatment was feeling better but shortly thereafter, headache increased in intensity again.  Nothing seems to make the pain better.  Sound and light make the pain worse.  Patient has history of headaches/migraines and states his symptoms are identical to previous except for the duration.  Patient states he has not had a headache lasting this long.  Not described however is the worst headache of his life, a thunderclap and was not sudden onset.  Pain localized diffusely and described as a throbbing.  Some nausea but otherwise no additional associated complaint beyond light sensitivity and sound sensitivity.  No fever, chills, vomiting, difficulty walking, lightheadedness, passing out, numbness/tingling/weakness, head injury, LOC, neck pain, sinus pain or vision change.    PCP: Pcp, None, MD    Specialists:           REVIEW OF SYMPTOMS     Review of Systems   Constitutional:  Negative for chills, diaphoresis and fever.   HENT:  Negative for congestion and sinus pain.    Eyes:  Positive for photophobia. Negative for blurred vision and double vision.   Respiratory:  Negative for cough and shortness of breath.    Gastrointestinal:  Positive for nausea. Negative for abdominal pain and vomiting.   Musculoskeletal:  Negative for back pain and neck pain.   Skin:  Negative for rash.   Neurological:  Positive for headaches. Negative for dizziness, tingling, sensory change, focal weakness and loss of consciousness.             PAST HISTORY     Medical/Surgical History:   Past Medical History:   Diagnosis Date    DM  (diabetes mellitus)      History reviewed. No pertinent surgical history.    Social History:   Social History     Tobacco Use    Smoking status: Unknown   Vaping Use    Vaping Use: Never used   Substance Use Topics    Alcohol use: Never    Drug use: Never        Family History:   History reviewed. No pertinent family history.    Medications:   The patient's home medications have been reviewed.    Allergies:   Patient has no known allergies.             PHYSICAL EXAM     Temp: 97.8 F (36.6 C)  Heart Rate: 77  Resp Rate: 16  BP: 143/66  SpO2: 99 %  Pain Score: 10-severe pain    Physical Exam  Vitals and nursing note reviewed.   Constitutional:       General: He is not in acute distress.     Appearance: Normal appearance. He is not ill-appearing, toxic-appearing or diaphoretic.   HENT:      Head: Normocephalic and atraumatic.      Nose: No congestion.      Comments: No tenderness with patient with palpation of frontal and maxillary sinuses.  No erythema or  edema.     Mouth/Throat:      Mouth: Mucous membranes are moist.   Eyes:      Extraocular Movements: Extraocular movements intact.      Conjunctiva/sclera: Conjunctivae normal.      Pupils: Pupils are equal, round, and reactive to light.   Neck:      Vascular: No carotid bruit.      Comments: No meningeal signs  Pulmonary:      Effort: Pulmonary effort is normal.   Musculoskeletal:      Cervical back: No rigidity.      Right lower leg: No edema.      Left lower leg: No edema.   Skin:     General: Skin is warm and dry.      Capillary Refill: Capillary refill takes less than 2 seconds.   Neurological:      General: No focal deficit present.      Mental Status: He is alert and oriented to person, place, and time.      Cranial Nerves: No cranial nerve deficit.      Sensory: No sensory deficit.      Motor: No weakness.      Coordination: Coordination normal.      Gait: Gait normal.                 MEDICAL DECISION MAKING   Laboratory Results:  Results       Procedure  Component Value Units Date/Time    Basic Metabolic Panel [[id number]]  (Abnormal) Collected: 07/05/22 1802    Specimen: Blood Updated: 07/05/22 1829     Glucose 271 mg/dL      BUN 40.9 mg/dL      Creatinine 1.2 mg/dL      Calcium 9.3 mg/dL      Sodium 811 mEq/L      Potassium 4.2 mEq/L      Chloride 104 mEq/L      CO2 26 mEq/L      Anion Gap 8.0     eGFR >60.0 mL/min/1.73 m2     Prothrombin time/INR [[id number]] Collected: 07/05/22 1802    Specimen: Blood Updated: 07/05/22 1820     PT 12.2 sec      PT INR 1.1    CBC and differential [[id number]]  (Abnormal) Collected: 07/05/22 1802    Specimen: Blood Updated: 07/05/22 1814     WBC 10.39 x10 3/uL      Hgb 14.6 g/dL      Hematocrit 46.9 %      Platelets 217 x10 3/uL      RBC 5.21 x10 6/uL      MCV 79.1 fL      MCH 28.0 pg      MCHC 35.4 g/dL      RDW 12 %      MPV 10.1 fL      Instrument Absolute Neutrophil Count 8.05 x10 3/uL      Neutrophils 77.4 %      Lymphocytes Automated 16.2 %      Monocytes 3.6 %      Eosinophils Automated 2.1 %      Basophils Automated 0.4 %      Immature Granulocytes 0.3 %      Nucleated RBC 0.0 /100 WBC      Neutrophils Absolute 8.05 x10 3/uL      Lymphocytes Absolute Automated 1.68 x10 3/uL      Monocytes Absolute Automated 0.37 x10 3/uL      Eosinophils  Absolute Automated 0.22 x10 3/uL      Basophils Absolute Automated 0.04 x10 3/uL      Immature Granulocytes Absolute 0.03 x10 3/uL      Absolute NRBC 0.00 x10 3/uL     COVID-19 (SARS-CoV-2) and Influenza A/B, NAA ([facility]) [[id number]] Collected: 07/05/22 1631    Specimen: Culturette from Nasopharyngeal Updated: 07/05/22 1655     Purpose of COVID testing Diagnostic -[facility]     SARS-CoV-2 Specimen Source Nasal Swab     SARS CoV 2 Overall Result Not Detected     Influenza A Not Detected     Influenza B Not Detected    Narrative:      o Collect and clearly label specimen type:  o PREFERRED-Upper respiratory specimen: One Nasal Swab in  Transport Media.  o Hand deliver to laboratory  ASAP  Diagnostic -[facility]             Radiology Results:  CT Angiogram Head    Result Date: 07/05/2022  1.Wide patency of the intracranial arterial circulation. 2.No aneurysm or AVM detected. Waynard Edwards, MD 07/05/2022 6:36 PM    CT Head without Contrast    Result Date: 07/05/2022   1.No CT evidence of acute intracranial abnormality. 2.Severe right sphenoid and posterior ethmoid sinusitis for which otolaryngology follow-up consultation and evaluation is again advised. An obstructive lesion at the right sphenoethmoidal recess is not excluded and endoscopic or surgical evaluation should be considered. Waynard Edwards, MD 07/05/2022 6:32 PM     Pulse Oximetry Interpretation: 99 % on RA         Nursing Notes: Reviewed available nursing notes.     Medical Records personally reviewed and found to have relevant information from: Nursing notes.      MDM: 21 y.o. male presents with HA    Medical Decision Making: The diagnosis of CVA is felt to be unlikely based on clinical history, absence of focal neurologic deficit, non-anatomic distribution of symptoms and absence of central findings on careful exam. The diagnosis of ICH is felt to be unlikely based on clinical history, no history of trauma, no history of anticoagulant use, normal mentation and non focal neurologic exam. The diagnosis of Intracranial mass is felt to be unlikely based on clinical history and reassuring non-focal neurologic exam. The diagnosis of Meningitis and Encephalitis is felt to be unlikely based on clinical history, normal neurologic examination, normal mentation, absence of nuccal rigidity and absence of fever. The diagnosis of Subarachnoid Hemorrhage is felt to be unlikely based on clinical history, onset of headache was not described as thunderclap, headache not sudden in onset or "worst of life," absence of nuccal rigidity and absence of risk factors for [facility]. No visual changes or eye pain to suggest angle closure glaucoma or temporal arteritis.   There is concern regarding the duration of patient's complaint.  However no acute changes since last being seen.  Patient did have a CT scan that did show some sinus disease.  May be contributing to his symptoms.  Patient is currently on antibiotic.  At this time, do not feel that repeat imaging is beneficial to the patient.  Will reconsider.   MRI not warranted based on history examination at this time.  Do not suspect meningitis or subarachnoid as above, do not feel that lumbar puncture would be of benefit to the patient and is not without risk.  These were discussed with patient at length.  Will reconsider pending ED course.  Given patient's age and gender,  cluster headache is in differential.  We will do a course of oxygen therapy pending IV medication treatment.  Will treat symptomatically for headache and reassess.        Differential diagnosis to include but not limited to: as above      Chronic illness impacting care (obesity, diabetes, hypertension, elderly - state impact):  has a past medical history of DM (diabetes mellitus).      Number and Complexity of Problems Addressed (select at least one)    Complexity: High: 1 acute or chronic illness or injury that poses a threat to life or bodily function        ED Course:      ED Course as of 07/05/22 1904   Thu Jul 05, 2022   1610 Tx with NR @15  LPM started [ST]   1627 No improvement with O2 therapy.  Nurse at bedside starting medication [ST]   1705 Influenza B: Not Detected [ST]   1705 Influenza A: Not Detected [ST]   1705 SARS CoV 2 Overall Result: Not Detected [ST]   1733 On reevaluation, patient resting on side of bed.  States that his pain is unchanged, still reporting a 10 out of 10 headache.  Of note, patient does not objectively appear to be in pain or discomfort.  That being said, given continued headache despite treatment, we did discuss imaging/further work-up.  We did discuss the entity of LP although I do believe subarachnoid hemorrhage is less  likely.  Do not suspect encephalitis or meningitis this time.  Regardless, after discussion, patient politely refusing LP.  We will proceed with CTA for further evaluation given persistent headache mainly to assess for possibility of a aneurysm, again albeit less likely.  Basic labs ordered to support this.  Will give dose of Haldol as secondary treatment for patient's headache. [ST]   1736 Ambulated to bathroom without assistance or difficulty [ST]   1755 Nurse was in patient's room shortly after I exited.  At that time, patient actually stated that he is doing much better, reporting a 5 out of 10 headache and a 10 out of 10 headache.  No interventions in the interim.  However patient and father expressed that they are still concerned and would like to make sure "everything is okay" prior to discharge and would like to proceed with a CTA.  Although this test is not all-encompassing, previous reasoning remains.  We will proceed with CTA.  Patient states he has an appointment next week to adjust his migraine medications with his neurologist. [ST]   1842 CTA reassuring.  CT scan demonstrates likely sinusitis.  Patient currently on antibiotics.  Do not feel that duration of antibiotic use defines failure at this time.  Will have patient finish antibiotic course pending outpatient follow-up. [ST]   1851 Patient feeling much better and is requesting discharge home.  Discussed results of the CTA which are reassuring.  Did discuss additionally the findings of a sinus infection and instructions to continue the antibiotics.  Patient father requesting something to help with the headaches pending their outpatient follow-up appointment.  We will provide short course of Patient given strict return precautions. [ST]      ED Course User Index  [ST] Garey Ham, DO       ______________________________________________________________________  Amount/complexity of Data Reviewed   {Tip - Level 4 = (1/3 of the following  categories); Level 5 = (2/3 categories).   This message will disappear upon signing. :  L\  EKG Interpretation as above, if indicated    Diagnostic tests appropriately considered even if not ultimately performed: labs, CTA, LP, MRI    ______________________________________________________________________    Risk of Complications and/or Morbidity or Mortality of Patient Management  (select one if applicable)    Risk: Prescription drug management  and High Risk    IMPRESSION AND DISPOSITION     Clinical Impression   1. Acute nonintractable headache, unspecified headache type    2. Acute non-recurrent ethmoidal sinusitis    3. Elevated blood pressure reading          Chart Ownership: I, Francie Massing, DO, am the primary clinician of record.       Garey Ham, DO  07/05/22 1904

## 2022-07-07 ENCOUNTER — Emergency Department: Payer: BC Managed Care – PPO

## 2022-07-07 ENCOUNTER — Inpatient Hospital Stay
Admission: EM | Admit: 2022-07-07 | Discharge: 2022-07-13 | DRG: 145 | Disposition: A | Discharge status: Home / Self Care | Payer: BC Managed Care – PPO | Attending: Internal Medicine | Admitting: Internal Medicine

## 2022-07-07 DIAGNOSIS — J3089 Other allergic rhinitis: Secondary | ICD-10-CM | POA: Diagnosis present

## 2022-07-07 DIAGNOSIS — E0965 Drug or chemical induced diabetes mellitus with hyperglycemia: Secondary | ICD-10-CM | POA: Diagnosis present

## 2022-07-07 DIAGNOSIS — Z6837 Body mass index (BMI) 37.0-37.9, adult: Secondary | ICD-10-CM

## 2022-07-07 DIAGNOSIS — R519 Headache, unspecified: Principal | ICD-10-CM | POA: Diagnosis present

## 2022-07-07 DIAGNOSIS — J352 Hypertrophy of adenoids: Secondary | ICD-10-CM | POA: Diagnosis present

## 2022-07-07 DIAGNOSIS — T380X5A Adverse effect of glucocorticoids and synthetic analogues, initial encounter: Secondary | ICD-10-CM | POA: Diagnosis present

## 2022-07-07 DIAGNOSIS — J019 Acute sinusitis, unspecified: Secondary | ICD-10-CM

## 2022-07-07 DIAGNOSIS — Z79899 Other long term (current) drug therapy: Secondary | ICD-10-CM

## 2022-07-07 DIAGNOSIS — Z794 Long term (current) use of insulin: Secondary | ICD-10-CM

## 2022-07-07 DIAGNOSIS — Z7952 Long term (current) use of systemic steroids: Secondary | ICD-10-CM

## 2022-07-07 DIAGNOSIS — G43909 Migraine, unspecified, not intractable, without status migrainosus: Secondary | ICD-10-CM | POA: Diagnosis present

## 2022-07-07 DIAGNOSIS — J343 Hypertrophy of nasal turbinates: Secondary | ICD-10-CM | POA: Diagnosis present

## 2022-07-07 DIAGNOSIS — J0120 Acute ethmoidal sinusitis, unspecified: Principal | ICD-10-CM | POA: Diagnosis present

## 2022-07-07 DIAGNOSIS — J0130 Acute sphenoidal sinusitis, unspecified: Secondary | ICD-10-CM | POA: Diagnosis present

## 2022-07-07 HISTORY — DX: Type 1 diabetes mellitus without complications: E10.9

## 2022-07-07 LAB — CSF MENINGITIS/ENCEPHALITIS PATHOGEN PANEL PCR
CSF Cryptococcus neoformans/gattii by PCR: NOT DETECTED
CSF Cytomegalovirus by PCR: NOT DETECTED
CSF Enterovirus by PCR: NOT DETECTED
CSF Eschericia coli K1 by PCR: NOT DETECTED
CSF Haemophilus influenza by PCR: NOT DETECTED
CSF Herpes simplex virus 1 by PCR: NOT DETECTED
CSF Herpes simplex virus 2 by PCR: NOT DETECTED
CSF Human herpesvirus 6 by PCR: NOT DETECTED
CSF Human parechovirus by PCR: NOT DETECTED
CSF Listeria monocytogenes by PCR: NOT DETECTED
CSF Neisseria meningitidis (encapsulated) by PCR: NOT DETECTED
CSF Streptococcus agalactiae by PCR: NOT DETECTED
CSF Streptococcus pneumoniae by PCR: NOT DETECTED
CSF Varicella zoster virus by PCR: NOT DETECTED

## 2022-07-07 LAB — CBC AND DIFFERENTIAL
Absolute NRBC: 0 10*3/uL (ref 0.00–0.00)
Basophils Absolute Automated: 0.04 10*3/uL (ref 0.00–0.08)
Basophils Automated: 0.3 %
Eosinophils Absolute Automated: 0.1 10*3/uL (ref 0.00–0.44)
Eosinophils Automated: 0.7 %
Hematocrit: 45.1 % (ref 37.6–49.6)
Hgb: 15.8 g/dL (ref 12.5–17.1)
Immature Granulocytes Absolute: 0.05 10*3/uL (ref 0.00–0.07)
Immature Granulocytes: 0.4 %
Instrument Absolute Neutrophil Count: 9.3 10*3/uL — ABNORMAL HIGH (ref 1.10–6.33)
Lymphocytes Absolute Automated: 3.06 10*3/uL (ref 0.42–3.22)
Lymphocytes Automated: 22.7 %
MCH: 27.8 pg (ref 25.1–33.5)
MCHC: 35 g/dL (ref 31.5–35.8)
MCV: 79.3 fL (ref 78.0–96.0)
MPV: 10 fL (ref 8.9–12.5)
Monocytes Absolute Automated: 0.94 10*3/uL — ABNORMAL HIGH (ref 0.21–0.85)
Monocytes: 7 %
Neutrophils Absolute: 9.3 10*3/uL — ABNORMAL HIGH (ref 1.10–6.33)
Neutrophils: 68.9 %
Nucleated RBC: 0 /100 WBC (ref 0.0–0.0)
Platelets: 253 10*3/uL (ref 142–346)
RBC: 5.69 10*6/uL (ref 4.20–5.90)
RDW: 13 % (ref 11–15)
WBC: 13.49 10*3/uL — ABNORMAL HIGH (ref 3.10–9.50)

## 2022-07-07 LAB — COMPREHENSIVE METABOLIC PANEL
ALT: 26 U/L (ref 0–55)
AST (SGOT): 12 U/L (ref 5–41)
Albumin/Globulin Ratio: 1.1 (ref 0.9–2.2)
Albumin: 3.9 g/dL (ref 3.5–5.0)
Alkaline Phosphatase: 65 U/L (ref 37–117)
Anion Gap: 9 (ref 5.0–15.0)
BUN: 14 mg/dL (ref 9.0–28.0)
Bilirubin, Total: 0.6 mg/dL (ref 0.2–1.2)
CO2: 27 mEq/L (ref 17–29)
Calcium: 9.6 mg/dL (ref 8.5–10.5)
Chloride: 99 mEq/L (ref 99–111)
Creatinine: 1.3 mg/dL (ref 0.5–1.5)
Globulin: 3.4 g/dL (ref 2.0–3.6)
Glucose: 228 mg/dL — ABNORMAL HIGH (ref 70–100)
Potassium: 4 mEq/L (ref 3.5–5.3)
Protein, Total: 7.3 g/dL (ref 6.0–8.3)
Sodium: 135 mEq/L (ref 135–145)
eGFR: >60 mL/min/{1.73_m2} (ref >=60)

## 2022-07-07 LAB — SEDIMENTATION RATE: Sed Rate: 4 mm/Hr (ref 0–15)

## 2022-07-07 LAB — MAGNESIUM: Magnesium: 1.6 mg/dL (ref 1.6–2.6)

## 2022-07-07 LAB — HEMOGLOBIN A1C
Average Estimated Glucose: 226 mg/dL
Hemoglobin A1C: 9.5 % — ABNORMAL HIGH (ref 4.6–5.6)

## 2022-07-07 LAB — CELL COUNT CSF TUBE #1
CSF Lymphocytes Tube #1: 90 % — ABNORMAL HIGH (ref 40–80)
CSF Macrophages Tube #1: 10 % — ABNORMAL LOW (ref 15–45)
CSF Neutrophils Tube #1: 0 % (ref 0–6)
CSF RBC Count Tube #1: 1 /mm3 (ref 0–3)
CSF WBC Count Tube #1: 1 /mm3 (ref 0–5)

## 2022-07-07 LAB — C-REACTIVE PROTEIN: C-Reactive Protein: 0.6 mg/dL (ref 0.0–1.1)

## 2022-07-07 LAB — PROTEIN, CSF: CSF Protein: 52 mg/dL — ABNORMAL HIGH (ref 15.0–40.0)

## 2022-07-07 LAB — CELL COUNT CSF TUBE #4
CSF Lymphocytes Tube #4: 80 % (ref 40–80)
CSF Macrophages Tube #4: 20 % (ref 15–45)
CSF Neutrophils Tube #4: 0 % (ref 0–6)
CSF RBC Count Tube #4: 1 /mm3 (ref 0–3)
CSF WBC Count Tube #4: 2 /mm3 (ref 0–5)

## 2022-07-07 LAB — GLUCOSE CSF: CSF Glucose: 151 mg/dL — ABNORMAL HIGH (ref 40–70)

## 2022-07-07 LAB — GLUCOSE WHOLE BLOOD - POCT
Whole Blood Glucose POCT: 349 mg/dL — ABNORMAL HIGH (ref 70–100)
Whole Blood Glucose POCT: 474 mg/dL — ABNORMAL HIGH (ref 70–100)

## 2022-07-07 LAB — LIPASE: Lipase: 22 U/L (ref 8–78)

## 2022-07-07 MED ORDER — METOCLOPRAMIDE HCL 5 MG/ML IJ SOLN
10.0000 mg | Freq: Once | INTRAMUSCULAR | Status: AC
Start: 2022-07-07 — End: 2022-07-07
  Administered 2022-07-07: 10 mg via INTRAVENOUS
  Filled 2022-07-07: qty 2

## 2022-07-07 MED ORDER — OXYMETAZOLINE HCL 0.05 % NA SOLN
2.0000 | Freq: Once | NASAL | Status: AC
Start: 2022-07-07 — End: 2022-07-10
  Administered 2022-07-07: 2 via NASAL
  Filled 2022-07-07: qty 15

## 2022-07-07 MED ORDER — GLUCAGON 1 MG IJ SOLR (WRAP)
1.0000 mg | INTRAMUSCULAR | Status: DC | PRN
Start: 2022-07-07 — End: 2022-07-09

## 2022-07-07 MED ORDER — ENOXAPARIN SODIUM 40 MG/0.4ML IJ SOSY
40.0000 mg | PREFILLED_SYRINGE | Freq: Every day | INTRAMUSCULAR | Status: DC
Start: 2022-07-07 — End: 2022-07-11
  Administered 2022-07-07 – 2022-07-11 (×5): 40 mg via SUBCUTANEOUS
  Filled 2022-07-07: qty 0.4

## 2022-07-07 MED ORDER — SODIUM CHLORIDE 0.9 % IV SOLN
INTRAVENOUS | Status: DC
Start: 2022-07-07 — End: 2022-07-08

## 2022-07-07 MED ORDER — POTASSIUM CHLORIDE CRYS ER 20 MEQ PO TBCR
0.0000 meq | EXTENDED_RELEASE_TABLET | ORAL | Status: DC | PRN
Start: 2022-07-07 — End: 2022-07-13

## 2022-07-07 MED ORDER — DEXTROSE 50 % IV SOLN
12.5000 g | INTRAVENOUS | Status: DC | PRN
Start: 2022-07-07 — End: 2022-07-08

## 2022-07-07 MED ORDER — INSULIN LISPRO 100 UNIT/ML SOLN (WRAP)
1.0000 [IU] | Freq: Every evening | Status: DC
Start: 2022-07-07 — End: 2022-07-09
  Administered 2022-07-07: 4 [IU] via SUBCUTANEOUS
  Administered 2022-07-08: 3 [IU] via SUBCUTANEOUS
  Filled 2022-07-07: qty 9

## 2022-07-07 MED ORDER — LEVOFLOXACIN IN D5W 750 MG/150ML IV SOLN
750.0000 mg | Freq: Once | INTRAVENOUS | Status: AC
Start: 2022-07-07 — End: 2022-07-07
  Administered 2022-07-07: 750 mg via INTRAVENOUS
  Filled 2022-07-07: qty 150

## 2022-07-07 MED ORDER — SALINE SPRAY 0.65 % NA SOLN
2.0000 | NASAL | Status: DC | PRN
Start: 2022-07-07 — End: 2022-07-13

## 2022-07-07 MED ORDER — SODIUM CHLORIDE 0.9 % IV BOLUS
1000.0000 mL | Freq: Once | INTRAVENOUS | Status: AC
Start: 2022-07-07 — End: 2022-07-07
  Administered 2022-07-07: 1000 mL via INTRAVENOUS

## 2022-07-07 MED ORDER — DEXAMETHASONE SOD PHOSPHATE PF 10 MG/ML IJ SOLN
10.0000 mg | Freq: Once | INTRAMUSCULAR | Status: AC
Start: 2022-07-07 — End: 2022-07-07
  Administered 2022-07-07: 10 mg via INTRAVENOUS
  Filled 2022-07-07: qty 1

## 2022-07-07 MED ORDER — SALINE SPRAY 0.65 % NA SOLN
2.0000 | Freq: Three times a day (TID) | NASAL | Status: DC
Start: 2022-07-07 — End: 2022-07-13
  Administered 2022-07-08 – 2022-07-13 (×11): 2 via NASAL
  Filled 2022-07-07: qty 44

## 2022-07-07 MED ORDER — DIPHENHYDRAMINE HCL 50 MG/ML IJ SOLN
25.0000 mg | Freq: Once | INTRAMUSCULAR | Status: AC
Start: 2022-07-07 — End: 2022-07-07
  Administered 2022-07-07: 25 mg via INTRAVENOUS
  Filled 2022-07-07: qty 1

## 2022-07-07 MED ORDER — MELATONIN 3 MG PO TABS
3.0000 mg | ORAL_TABLET | Freq: Every evening | ORAL | Status: DC | PRN
Start: 2022-07-07 — End: 2022-07-13

## 2022-07-07 MED ORDER — DEXTROSE 10 % IV BOLUS
12.5000 g | INTRAVENOUS | Status: DC | PRN
Start: 2022-07-07 — End: 2022-07-09

## 2022-07-07 MED ORDER — DEXAMETHASONE SODIUM PHOSPHATE 4 MG/ML IJ SOLN (WRAP)
8.0000 mg | Freq: Three times a day (TID) | INTRAMUSCULAR | Status: DC
Start: 2022-07-07 — End: 2022-07-08
  Administered 2022-07-07 – 2022-07-08 (×2): 8 mg via INTRAVENOUS

## 2022-07-07 MED ORDER — DEXTROSE 10 % IV BOLUS
12.5000 g | INTRAVENOUS | Status: DC | PRN
Start: 2022-07-07 — End: 2022-07-08

## 2022-07-07 MED ORDER — POTASSIUM CHLORIDE 10 MEQ/100ML IV SOLN
10.0000 meq | INTRAVENOUS | Status: DC | PRN
Start: 2022-07-07 — End: 2022-07-13

## 2022-07-07 MED ORDER — BENZOCAINE-MENTHOL MT LOZG (WRAP)
1.0000 | LOZENGE | OROMUCOSAL | Status: DC | PRN
Start: 2022-07-07 — End: 2022-07-11

## 2022-07-07 MED ORDER — CARBOXYMETHYLCELLULOSE SODIUM 0.5 % OP SOLN
1.0000 [drp] | Freq: Three times a day (TID) | OPHTHALMIC | Status: DC | PRN
Start: 2022-07-07 — End: 2022-07-13

## 2022-07-07 MED ORDER — MAGNESIUM SULFATE IN D5W 1-5 GM/100ML-% IV SOLN
1.0000 g | INTRAVENOUS | Status: DC | PRN
Start: 2022-07-07 — End: 2022-07-13

## 2022-07-07 MED ORDER — BUTALBITAL-APAP-CAFFEINE 50-325-40 MG PO TABS
1.0000 | ORAL_TABLET | ORAL | Status: DC | PRN
Start: 2022-07-07 — End: 2022-07-13
  Administered 2022-07-07 – 2022-07-11 (×5): 1 via ORAL

## 2022-07-07 MED ORDER — KETOROLAC TROMETHAMINE 30 MG/ML IJ SOLN
30.0000 mg | Freq: Once | INTRAMUSCULAR | Status: AC
Start: 2022-07-07 — End: 2022-07-07
  Administered 2022-07-07: 30 mg via INTRAVENOUS
  Filled 2022-07-07: qty 1

## 2022-07-07 MED ORDER — GLUCOSE 40 % PO GEL (WRAP)
15.0000 g | ORAL | Status: DC | PRN
Start: 2022-07-07 — End: 2022-07-08

## 2022-07-07 MED ORDER — POTASSIUM & SODIUM PHOSPHATES 280-160-250 MG PO PACK
2.0000 | PACK | ORAL | Status: DC | PRN
Start: 2022-07-07 — End: 2022-07-13

## 2022-07-07 MED ORDER — IOHEXOL 350 MG/ML IV SOLN
100.0000 mL | Freq: Once | INTRAVENOUS | Status: AC | PRN
Start: 2022-07-07 — End: 2022-07-07
  Administered 2022-07-07: 100 mL via INTRAVENOUS

## 2022-07-07 MED ORDER — ACETAMINOPHEN 325 MG PO TABS
650.0000 mg | ORAL_TABLET | ORAL | Status: DC | PRN
Start: 2022-07-07 — End: 2022-07-08
  Administered 2022-07-07: 650 mg via ORAL

## 2022-07-07 MED ORDER — INSULIN LISPRO 100 UNIT/ML SOLN (WRAP)
1.0000 [IU] | Freq: Three times a day (TID) | Status: DC
Start: 2022-07-07 — End: 2022-07-09
  Administered 2022-07-07 – 2022-07-08 (×4): 7 [IU] via SUBCUTANEOUS
  Administered 2022-07-09: 3 [IU] via SUBCUTANEOUS
  Administered 2022-07-09: 5 [IU] via SUBCUTANEOUS
  Administered 2022-07-09: 3 [IU] via SUBCUTANEOUS
  Filled 2022-07-07: qty 9
  Filled 2022-07-07: qty 21
  Filled 2022-07-07: qty 30

## 2022-07-07 MED ORDER — GLUCAGON 1 MG IJ SOLR (WRAP)
1.0000 mg | INTRAMUSCULAR | Status: DC | PRN
Start: 2022-07-07 — End: 2022-07-08

## 2022-07-07 MED ORDER — BENZONATATE 100 MG PO CAPS
100.0000 mg | ORAL_CAPSULE | Freq: Three times a day (TID) | ORAL | Status: DC | PRN
Start: 2022-07-07 — End: 2022-07-13

## 2022-07-07 MED ORDER — DEXTROSE 50 % IV SOLN
12.5000 g | INTRAVENOUS | Status: DC | PRN
Start: 2022-07-07 — End: 2022-07-09

## 2022-07-07 MED ORDER — NALOXONE HCL 0.4 MG/ML IJ SOLN (WRAP)
0.2000 mg | INTRAMUSCULAR | Status: DC | PRN
Start: 2022-07-07 — End: 2022-07-13

## 2022-07-07 MED ORDER — GLUCOSE 40 % PO GEL (WRAP)
15.0000 g | ORAL | Status: DC | PRN
Start: 2022-07-07 — End: 2022-07-10

## 2022-07-07 MED ORDER — FLUTICASONE PROPIONATE 50 MCG/ACT NA SUSP
1.0000 | Freq: Every day | NASAL | Status: DC
Start: 2022-07-08 — End: 2022-07-09
  Administered 2022-07-08 – 2022-07-09 (×2): 1 via NASAL
  Filled 2022-07-07: qty 16

## 2022-07-07 NOTE — Plan of Care (Signed)
Problem: Pain interferes with ability to perform ADL  Goal: Pain at adequate level as identified by patient  Outcome: Progressing  Flowsheets (Taken 07/07/2022 1834)  Pain at adequate level as identified by patient: Evaluate if patient comfort function goal is met     Problem: Side Effects from Pain Analgesia  Goal: Patient will experience minimal side effects of analgesic therapy  Outcome: Progressing  Flowsheets (Taken 07/07/2022 1848)  Patient will experience minimal side effects of analgesic therapy:   Monitor/assess patient's respiratory status (RR depth, effort, breath sounds)   Assess for changes in cognitive function     Problem: Safety  Goal: Patient will be free from injury during hospitalization  Outcome: Progressing  Flowsheets (Taken 07/07/2022 1848)  Patient will be free from injury during hospitalization:   Provide and maintain safe environment   Use appropriate transfer methods   Hourly rounding   Assess for patients risk for elopement and implement Elopement Risk Plan per policy   Include patient/ family/ care giver in decisions related to safety   Assess patient's risk for falls and implement fall prevention plan of care per policy  Goal: Patient will be free from infection during hospitalization  Outcome: Progressing  Flowsheets (Taken 07/07/2022 1848)  Free from Infection during hospitalization:   Assess and monitor for signs and symptoms of infection   Monitor lab/diagnostic results   Encourage patient and family to use good hand hygiene technique     Problem: Pain  Goal: Pain at adequate level as identified by patient  Outcome: Progressing  Flowsheets (Taken 07/07/2022 1834)  Pain at adequate level as identified by patient: Evaluate if patient comfort function goal is met     Problem: Psychosocial and Spiritual Needs  Goal: Demonstrates ability to cope with hospitalization/illness  Outcome: Progressing  Flowsheets (Taken 07/07/2022 1848)  Demonstrates ability to cope with  hospitalizations/illness:   Encourage verbalization of feelings/concerns/expectations   Provide quiet environment   Encourage patient to set small goals for self

## 2022-07-07 NOTE — ED Triage Notes (Signed)
Pt ambulatory into ED. Here today for the third time this week for same c/o of sinus headache. Was prescribed an abx and steroid pack which he states is not helping. Has HA, sinus and neck pain and nausea

## 2022-07-07 NOTE — ED Provider Notes (Signed)
[facility] EMERGENCY DEPARTMENT  ATTENDING PHYSICIAN HISTORY AND PHYSICAL EXAM     Patient Name: [name], [name]  Age: 21 y.o. male  Department:[facility]  Encounter Date:  07/07/2022   Attending Physician: Horatio Pel, MD is the primary attending for this patient and has obtained and performed the history, PE, and medical decision making for this patient.     HISTORY OF PRESENT ILLNESS   Chief Complaint: Headache and Nausea    [name] is a 21 y.o. male who presents to the ED with headache and nausea; history obtained from patient, father.  Patient has a history of diabetes and is on insulin; Pt with a 3wk h/o HA assoc with runny nose, cough, congestion, sinus pressure/pain.  Symptoms also associated nausea but no vomiting.  No fevers or chills.  Headache was gradual in onset 3 weeks ago and since then has been persistent dull throbbing.  Headache is primarily to the back of his head and worse with putting his head down, bending down, etc.  Headache is associated with photophobia, mild as well as some neck discomfort.  Denies any sudden or thunderclap headaches.  No speech or vision changes.  No numbness or weakness.  No incontinence or retention.  No seizures.  Patient was seen in the ER on 07/01/2022 and diagnosed with acute sphenoid sinusitis, acute ethmoid sinusitis, acute migraines and discharged home on Augmentin, Fioricet, Medrol; Patient was reseen in the ER 2 days ago and CT head showed severe right sphenoid/ethmoid sinusitis.  Discussion about an LP but patient refused.  CTA head and neck was negative.  Patient now returns to the ER with continued pain/symptoms.  And no improvement despite taking the medications that were prescribed;    Triage:     bad head pains, migraines, sinuses, ears, neck hurt. nothing helping "       History obtained from: Patient; father  Onset of Symptoms: 3 weeks  Duration of Symptoms: See above  Context: see above  Quality: Dull throbbing  Location: Back of his  head  Radiation: none  Severity: Moderate to severe  Aggravating Factors: Putting his head down, bending over, etc.  Alleviating Factors: None  Associated Symptoms: URI symptoms, nausea, etc. as above    PMD: None.  Recently moved from [place].  ALL: No Known Allergies    PMH: Confirmed with the patient and updated as below  PSH: Confirmed with the patient and updated as below  Social History: Confirmed with the patient and updated as below      MEDICAL HISTORY     Past Medical History:  Past Medical History:   Diagnosis Date    DM (diabetes mellitus)        Past Surgical History:  History reviewed. No pertinent surgical history.    Social History:  Social History     Socioeconomic History    Marital status: Single   Tobacco Use    Smoking status: Never    Smokeless tobacco: Never   Vaping Use    Vaping Use: Never used   Substance and Sexual Activity    Alcohol use: Never    Drug use: Never     Social Determinants of Health     Food Insecurity: No Food Insecurity (07/05/2022)    Hunger Vital Sign     Worried About Running Out of Food in the Last Year: Never true     Ran Out of Food in the Last Year: Never true   Intimate Partner Violence: Not  At Risk (07/05/2022)    Humiliation, Afraid, Rape, and Kick questionnaire     Fear of Current or Ex-Partner: No     Emotionally Abused: No     Physically Abused: No     Sexually Abused: No       Family History:  History reviewed. No pertinent family history.    Outpatient Medication:  Previous Medications    AMOXICILLIN-CLAVULANATE (AUGMENTIN) 875-125 MG PER TABLET    Take 1 tablet by mouth 2 (two) times daily for 10 days    BUTALBITAL-ACETAMINOPHEN-CAFFEINE (FIORICET) 50-325-40 MG PER TABLET    Take 1 tablet by mouth every 4 (four) hours as needed for Headaches    INSULIN GLARGINE 100 UNIT/ML PEN-INJECTOR    Inject into the skin nightly    METHYLPREDNISOLONE (MEDROL DOSEPAK) 4 MG TABLET    Use as directed    RIZATRIPTAN (MAXALT) 5 MG TABLET    Take 2 tablets (10 mg) by mouth  as needed for Migraine May repeat in 2 hours if needed       PHYSICAL EXAM     ED Triage Vitals [07/07/22 1021]   Enc Vitals Group      BP 133/65      Heart Rate 62      Resp Rate 16      Temp 97 F (36.1 C)      Temp src       SpO2 98 %      Weight 144.1 kg      Height 1.956 m      Head Circumference       Peak Flow       Pain Score 0      Pain Loc       Pain Edu?       Excl. in GC?        Vitals:    07/07/22 1030 07/07/22 1300 07/07/22 1330 07/07/22 1630   BP: 133/65 126/72  155/74   Pulse: (!) 59 (!) 51  (!) 57   Resp:       Temp:       SpO2: 97% 100% 100% 100%   Weight:       Height:             Physical Exam  Vitals and nursing note reviewed.   Constitutional:       General: He is in acute distress (appears to be in pain).      Appearance: Normal appearance. He is not ill-appearing or toxic-appearing.      Comments: Awake and alert; NAD; Non-toxic; No resp distress;   Answers all questions appropriately     HENT:      Head: Normocephalic and atraumatic.      Comments: No temporal artery tenderness     Mouth/Throat:      Mouth: Mucous membranes are moist.      Pharynx: Oropharynx is clear.      Comments: Oropharynx: clear; No swelling; No lip/tongue swelling; no swelling under the tongue; Normal appearing uvula; No abscess/obstruction;  No drooling; No stridor; No trismus  No signs or symptoms of RPA/PTA/epiglottitis or abscess or obstruction    Eyes:      General: No scleral icterus.     Extraocular Movements: Extraocular movements intact.      Conjunctiva/sclera: Conjunctivae normal.      Pupils: Pupils are equal, round, and reactive to light.   Neck:      Comments: Neck supple and no meningismus  Cardiovascular:      Rate and Rhythm: Normal rate and regular rhythm.      Pulses: Normal pulses.      Heart sounds: Normal heart sounds. No murmur heard.  Pulmonary:      Effort: Pulmonary effort is normal. No respiratory distress.      Breath sounds: Normal breath sounds. No stridor. No wheezing, rhonchi or  rales.   Abdominal:      General: Abdomen is flat. There is no distension.      Palpations: Abdomen is soft.      Tenderness: There is no abdominal tenderness. There is no right CVA tenderness, left CVA tenderness, guarding or rebound.   Musculoskeletal:         General: No swelling or tenderness. Normal range of motion.      Cervical back: Normal range of motion and neck supple. No rigidity or tenderness.      Comments: No calf tenderness   Skin:     General: Skin is warm and dry.      Capillary Refill: Capillary refill takes less than 2 seconds.      Coloration: Skin is not jaundiced or pale.      Findings: No rash.   Neurological:      General: No focal deficit present.      Mental Status: He is alert and oriented to person, place, and time. Mental status is at baseline.      Cranial Nerves: No cranial nerve deficit.      Sensory: No sensory deficit.      Motor: No weakness.      Coordination: Coordination normal.      Gait: Gait normal.      Comments: Neuro Exam:  Normal strength/sensory to all 4ext;  No pronator; Finger to nose wnl;  No arm or leg drift;   Normal ambulation.   EHL 5/5 bilaterally;  Normal Babinski;   Normal cranial nerve exam;   PERRL EOMI; No nystagmus;   Normal speech; No vision changes     Psychiatric:         Mood and Affect: Affect is flat.            Lumbar Puncture    Date/Time: 07/07/2022 12:57 PM    Performed by: Horatio Pel, MD  Authorized by: Horatio Pel, MD  Consent: The procedure was performed in an emergent situation. Verbal consent obtained. Written consent obtained.  Risks and benefits: risks, benefits and alternatives were discussed  Consent given by: patient and parent (Risk/benefits discussed with patient and father.  Father gives permission)  Patient understanding: patient states understanding of the procedure being performed  Patient consent: the patient's understanding of the procedure matches consent given  Procedure consent: procedure consent matches procedure  scheduled  Site marked: the operative site was marked  Imaging studies: imaging studies available  Required items: required blood products, implants, devices, and special equipment available  Patient identity confirmed: verbally with patient, hospital-assigned identification number and anonymous protocol, patient vented/unresponsive  Time out: Immediately prior to procedure a "time out" was called to verify the correct patient, procedure, equipment, support staff and site/side marked as required.  Indications: evaluation for infection  Anesthesia: local infiltration    Anesthesia:  Local Anesthetic: lidocaine 1% with epinephrine  Anesthetic total: 4 mL    Sedation:  Patient sedated: no    Preparation: Patient was prepped and draped in the usual sterile fashion.  Lumbar space: L3-L4 interspace  Patient's position: sitting  Needle gauge: 18  Needle type: spinal needle - Quincke tip  Needle length: 3.5 in  Number of attempts: 1  Fluid appearance: clear  Tubes of fluid: 4  Total volume: 8 ml  Post-procedure: site cleaned and adhesive bandage applied  Patient tolerance: patient tolerated the procedure well with no immediate complications        MEDICAL DECISION MAKING/ED COURSE   -->I personally reviewed Nursing Notes, vital signs and pulse oximetry;   -->I personally reviewed previous/outside records as available    -->Oxygen Saturation by Pulse Oximetry: 100%  On RA  Indicating normal oxygenation;  Interventions needed: None    -->Pt seen/examined on arrival to the ED;    --Patient with 3-week history of headache, URI Symptoms; headache is moderate, constant, persistent and not improving despite multiple medications.  Patient has been seen in the ER twice and diagnosed with persistent sinusitis to the sphenoid/ethmoid sinuses.  No improvement with outpatient management, outpatient antibiotics.  Patient reports that he has been compliant with the antibiotics;  --Normal neuro exam; no deficits  --Labs, w/u  initiated  --Possibility of LP discussed with patient and father  --DDX discussed in detail;     --> Labs and work-up noted: White blood cell count is elevated at 13.49.  Rest of the CBC is normal.  Electrolytes are normal except for elevated blood sugar of 228.  LFTs are normal.  Sed rate and CRP are both normal.  Magnesium is normal.  Lipase is normal.  Head CT shows no acute intracranial hemorrhage or mass effect or hydrocephalus.  Also prominent right sphenoid sinus and posterior right ethmoid air cell disease    --> IV Levaquin ordered    --Entire case discussed with  Dr Janalyn Rouse, ID, and agrees with ED mgmt and LP;    --LP: Risk/benefits were discussed with the patient, father.  Patient and father both agree.  Since patient has been on outpatient antibiotics, will send specimens for PCR as well.    --LP results noted; preliminary results are negative with 1 WBC in Tube 1 and  2 WBCs in tube 4; 1 RBC in tube 1, tube 4; preliminary Gram stain is negative.  No WBCs or organisms seen.    --Entire case d/w Dr Rondel Jumbo, ENT on call, and will consult; agrees with ED mgmt and recommends the following:  Decadron 10mg  IV now and then 8mg  IV q8hrs  Sliding scale insulin  Afrin nasal spray--2 sprays every 8hrs for the next 3days  Flonase  Check A1C  CT Sinuses/maxillofacial with IV contrast    --> Entire case discussed with the pediatric hospitalist and they would prefer the adult hospitalist to admit  --> Entire case discussed with adult hospitalist and will admit         DATA REVIEWED     Vital Signs: Reviewed the patient's vital signs.   Nursing Notes: Reviewed and utilized available nursing notes.  Medical Records Reviewed: Reviewed available past medical records if available.     RADIOLOGY IMAGING STUDIES      CT Sinus Facial Bones with Contrast    Result Date: 07/07/2022  1.There is severe mid-posterior right ethmoid air cell and right sphenoid sinus opacification. There is bowing of the right lamina papyracea.  There is involvement of the right nasal cavity. There is increased attenuation within the secretions. Diagnostic possibilities include but are not limited to a mucocele and underlying paranasal sinus disease, severe paranasal sinus disease with inspissated secretions, fungal disease or polyposis. 2.Mild  mucosal thickening within the left ethmoid air cells, left sphenoid sinus and both maxillary sinuses. 3.Prominent adenoids and palatine tonsils. Correlation with the patient's immune status may be of benefit. There is an impacted tooth with surrounding lucency situated between the right maxillary cuspid and first bicuspid teeth. Otho Ket, DO 07/07/2022 3:10 PM    CT Head WO Contrast    Result Date: 07/07/2022   1.  No acute intracranial hemorrhage, mass effect, or hydrocephalus. 2.  Prominent right sphenoid sinus and posterior right ethmoid air cell disease, 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 Melody Haver, MD 07/07/2022 11:51 AM      LABORATORY RESULTS    Ordered and independently interpreted AVAILABLE laboratory tests. Please see results section in chart for full details.    Results       Procedure Component Value Units Date/Time    Hemoglobin A1C [[id number]] Collected: 07/07/22 1129    Specimen: Blood Updated: 07/07/22 1429    Narrative:      This is NOT the correct Test for Patients with  Hemoglobinopathy.    CSF Cell Count Tube #4 [[id number]] Collected: 07/07/22 1259    Specimen: CSF (Lumbar Puncture Spinal Fluid) Updated: 07/07/22 1355     CSF WBC Count Tube #4 2 /cumm      CSF RBC Count Tube #4 1 /cumm      CSF Neutrophils Tube #4 0 %      CSF Lymphocytes Tube #4 80 %      CSF Macrophages Tube #4 20 %     CSF Protein [[id number]]  (Abnormal) Collected: 07/07/22 1259    Specimen: Cerebrospinal Fluid from CSF (Lumbar Puncture Spinal Fluid) Updated: 07/07/22 1353     CSF Protein 52.0 mg/dL      CSF Spun Appearance Colorless    Preliminary Gram Stain [[id number]] Collected: 07/07/22 1259     Updated: 07/07/22 1352     Narrative:      ORDER#: [id number]                                    ORDERED BY: Livana Yerian  SOURCE: CSF (Lumbar Puncture Spinal Fluid) Tube 3    COLLECTED:  07/07/22 12:59  ANTIBIOTICS AT COLL.:                                RECEIVED :  07/07/22 13:51  Preliminary Gram Stain                     FINAL       07/07/22 13:52  07/07/22   No WBCs or organisms seen             For final Gram Stain report, see order #[id number] XC             This is a preliminary result. The gram stain will be reviewed             by the Microbiology Department at the [facility]             See final result under Stain, Gram.      CSF Glucose [[id number]]  (Abnormal) Collected: 07/07/22 1259    Specimen: Cerebrospinal Fluid from CSF (Lumbar Puncture Spinal Fluid) Updated: 07/07/22 1348     CSF Glucose 151 mg/dL     CSF Cell Count Tube #1 [[id number]]  (Abnormal)  Collected: 07/07/22 1259    Specimen: CSF (Lumbar Puncture Spinal Fluid) Updated: 07/07/22 1346     CSF WBC Count Tube #1 1 /cumm      CSF RBC Count Tube #1 1 /cumm      CSF Neutrophils Tube #1 0 %      CSF Lymphocytes Tube #1 90 %      CSF Macrophages Tube #1 10 %     Culture + Gram Stain,Aerobic, CSF [[id number]] Collected: 07/07/22 1259    Specimen: Cerebrospinal Fluid from CSF (Lumbar Puncture Spinal Fluid) Updated: 07/07/22 1259    Narrative:      Use middle tube    CSF Meningitis/Encephalitis Pathogen Panel PCR [[id number]] Collected: 07/07/22 1259    Specimen: CSF (Lumbar Puncture Spinal Fluid) Updated: 07/07/22 1259    Narrative:      Indication for Meningitis/Encephalitis Panel PCR  Testing:->Pretreated CSF sample with concern for acute  bacterial meningitis  Use middle tube.CSF Lumbar Puncture Only    Sedimentation rate (ESR) [[id number]] Collected: 07/07/22 1129    Specimen: Blood Updated: 07/07/22 1205     Sed Rate 4 mm/Hr     Lipase [[id number]] Collected: 07/07/22 1129    Specimen: Blood Updated: 07/07/22 1153     Lipase 22 U/L     Comprehensive metabolic panel  [[id number]]  (Abnormal) Collected: 07/07/22 1129    Specimen: Blood Updated: 07/07/22 1153     Glucose 228 mg/dL      BUN 24.4 mg/dL      Creatinine 1.3 mg/dL      Sodium 010 mEq/L      Potassium 4.0 mEq/L      Chloride 99 mEq/L      CO2 27 mEq/L      Calcium 9.6 mg/dL      Protein, Total 7.3 g/dL      Albumin 3.9 g/dL      AST (SGOT) 12 U/L      ALT 26 U/L      Alkaline Phosphatase 65 U/L      Bilirubin, Total 0.6 mg/dL      Globulin 3.4 g/dL      Albumin/Globulin Ratio 1.1     Anion Gap 9.0     eGFR >60.0 mL/min/1.73 m2     C Reactive Protein [[id number]] Collected: 07/07/22 1129    Specimen: Blood Updated: 07/07/22 1153     C-Reactive Protein 0.6 mg/dL     Magnesium [[id number]] Collected: 07/07/22 1129    Specimen: Blood Updated: 07/07/22 1153     Magnesium 1.6 mg/dL     CBC and differential [[id number]]  (Abnormal) Collected: 07/07/22 1129    Specimen: Blood Updated: 07/07/22 1140     WBC 13.49 x10 3/uL      Hgb 15.8 g/dL      Hematocrit 29.5 %      Platelets 253 x10 3/uL      RBC 5.69 x10 6/uL      MCV 79.3 fL      MCH 27.8 pg      MCHC 35.0 g/dL      RDW 13 %      MPV 10.0 fL      Instrument Absolute Neutrophil Count 9.30 x10 3/uL      Neutrophils 68.9 %      Lymphocytes Automated 22.7 %      Monocytes 7.0 %      Eosinophils Automated 0.7 %      Basophils Automated 0.3 %      Immature  Granulocytes 0.4 %      Nucleated RBC 0.0 /100 WBC      Neutrophils Absolute 9.30 x10 3/uL      Lymphocytes Absolute Automated 3.06 x10 3/uL      Monocytes Absolute Automated 0.94 x10 3/uL      Eosinophils Absolute Automated 0.10 x10 3/uL      Basophils Absolute Automated 0.04 x10 3/uL      Immature Granulocytes Absolute 0.05 x10 3/uL      Absolute NRBC 0.00 x10 3/uL                   Medical Decision Making  Problems Addressed:  Acute ethmoidal sinusitis, recurrence not specified: acute illness or injury that poses a threat to life or bodily functions  Acute intractable headache, unspecified headache type: acute illness or injury  that poses a threat to life or bodily functions  Acute sphenoidal sinusitis, recurrence not specified: acute illness or injury that poses a threat to life or bodily functions    Amount and/or Complexity of Data Reviewed  Independent Historian: parent  Labs: ordered. Decision-making details documented in ED Course.  Radiology: ordered. Decision-making details documented in ED Course.    Risk  OTC drugs.  Prescription drug management.  Decision regarding hospitalization.        Number and Complexity of Problems Addressed (select at least one)    Complexity: High: 1 acute or chronic illness or injury that poses a threat to life or bodily function      Presenting acute/chronic problems: Persistent headache despite outpatient therapies    DDX: Differential diagnosis to include but not limited to:  headache, migraine, sinusitis, complicated sinusitis, Pseudomonas, fungal infection, cluster HA, Hypertensive HA,; Doubt the following: ICH, mass, SAH, meningitis, encephalitis, temporal arteritis, glaucoma, pseudotumor, etc.      Chronic illness impacting care (obesity, diabetes, hypertension, elderly - state impact): patient is a type I diabetic which increases the risk of infections, complications      Amount/complexity of Data Reviewed      Labs/Imaging Ordered during this visit:   Orders Placed This Encounter   Procedures    Lumbar Puncture    Culture + Gram Stain,Aerobic, CSF    CSF Meningitis/Encephalitis Pathogen Panel PCR    Preliminary Gram Stain    CT Head WO Contrast    CT Sinus Facial Bones with Contrast    CBC and differential    Comprehensive metabolic panel    Sedimentation rate (ESR)    C Reactive Protein    Magnesium    Lipase    CSF Cell Count Tube #1    CSF Cell Count Tube #4    CSF Protein    CSF Glucose    Hemoglobin A1C    Target Glucose Goals    Nursing communication    NSG Communication: Glucose POCT order (PRN hypoglycemia)    Notify physician (Critical Blood Glucose Value)    Notify physician  (Communication: Document Abnormal Blood Glucose)    POCT order (PRN hypoglycemia)    Adult Hypoglycemia Treatment Algorithm    NSG Communication: Glucose POCT order    NSG Communication: Glucose POCT order    Full Code    Saline lock IV    Admit to Inpatient    Request for Transfer Center    Supervise For Meals Frequency: All meals        Labs reviewed during this visit: see above    History obtained from another historian (parent, spouse,  care giver, ems) : Father    Review of older external records from previous ED chart reviewed and found this relevant information: Patient is currently on Augmentin    Independent  interpretation of radiological study by me:     N/A    Diagnostic tests appropriately considered even if not ultimately performed: MRIs    Discussion of test interpretation with external physician/provider :  N/A    Discussion of management with other providers: Consulted with infectious disease doctor, ENT, pediatric hospitalist, adult hospitalist;. Patient condition and all pertinent labs and/or radiology studies discussed with physician.     Risk of Complications and/or Morbidity or Mortality of Patient Management       Risk: High Risk and Decision regarding hospitalization or escalation of hospital level of care       CRITICAL CARE:    Due to the high risk of critical illness or multi-organ failure at initial presentation and/or during ED course.   This does not including time spent performing other reported procedures or services.  Critical care time involved full attention to the patient's condition and included:     (+)Review of nursing notes and/or old charts  (+)Review of medications, allergies, and vital signs    (+)Documentation time  (+)Consultant collaboration on findings and treatment options    (+)Care, transfer of care, and discharge plans  (+) Ordering, interpreting, and reviewing diagnostic studies/tab tests    (+)Obtaining necessary history from family, EMS, nursing home staff  and/or treating physicians.                 Diagnosis:  Final diagnoses:   Acute intractable headache, unspecified headache type   Acute sphenoidal sinusitis, recurrence not specified   Acute ethmoidal sinusitis, recurrence not specified       Disposition:  ED Disposition       ED Disposition   Admit    Condition   --    Date/Time   Sat Jul 07, 2022  2:35 PM    Comment   Admitting Physician: Abram Sander [[id number]]   Service:: Medicine [[id number]]   Estimated Length of Stay: 3 - 5 Days   Tentative Discharge Plan?: Home or Self Care [1]                 Follow-Up Providers (if applicable)    No follow-up provider specified.     New Prescriptions    No medications on file       Prescriptions:  Patient's Medications   New Prescriptions    No medications on file   Previous Medications    AMOXICILLIN-CLAVULANATE (AUGMENTIN) 875-125 MG PER TABLET    Take 1 tablet by mouth 2 (two) times daily for 10 days    BUTALBITAL-ACETAMINOPHEN-CAFFEINE (FIORICET) 50-325-40 MG PER TABLET    Take 1 tablet by mouth every 4 (four) hours as needed for Headaches    INSULIN GLARGINE 100 UNIT/ML PEN-INJECTOR    Inject into the skin nightly    METHYLPREDNISOLONE (MEDROL DOSEPAK) 4 MG TABLET    Use as directed    RIZATRIPTAN (MAXALT) 5 MG TABLET    Take 2 tablets (10 mg) by mouth as needed for Migraine May repeat in 2 hours if needed   Modified Medications    No medications on file   Discontinued Medications    No medications on file         EMERGENCY DEPT. MEDICATIONS      ED Medication Orders (From admission, onward)  Start Ordered     Status Ordering Provider    07/07/22 2200 07/07/22 1439  insulin lispro injection 1-4 Units  At bedtime        Route: Subcutaneous  Ordered Dose: 1-4 Units      See Hyperspace for full Linked Orders Report.    Ordered MALIVUK, MATTHEW    07/07/22 1645 07/07/22 1439  insulin lispro injection 1-8 Units  3 times daily before meals        Route: Subcutaneous  Ordered Dose: 1-8 Units      See Hyperspace for full  Linked Orders Report.    Ordered MALIVUK, MATTHEW    07/07/22 1450 07/07/22 1450  iohexol (OMNIPAQUE) 350 MG/ML injection 100 mL  IMG once as needed        Route: Intravenous  Ordered Dose: 100 mL       Last MAR action: Imaging Agent Given MALIVUK, MATTHEW    07/07/22 1439 07/07/22 1439  dextrose (GLUCOSE) 40 % oral gel 15 g of glucose  As needed        Route: Oral  Ordered Dose: 15 g of glucose      See Hyperspace for full Linked Orders Report.    Ordered MALIVUK, MATTHEW    07/07/22 1439 07/07/22 1439  dextrose (D10W) 10% bolus 125 mL  As needed        Route: Intravenous  Ordered Dose: 12.5 g      See Hyperspace for full Linked Orders Report.    Ordered MALIVUK, MATTHEW    07/07/22 1439 07/07/22 1439  dextrose 50 % bolus 12.5 g  As needed        Route: Intravenous  Ordered Dose: 12.5 g      See Hyperspace for full Linked Orders Report.    Ordered MALIVUK, MATTHEW    07/07/22 1439 07/07/22 1439  glucagon (rDNA) (GLUCAGEN) injection 1 mg  As needed        Route: Intramuscular  Ordered Dose: 1 mg      See Hyperspace for full Linked Orders Report.    Ordered MALIVUK, MATTHEW    07/07/22 1415 07/07/22 1403  dexAMETHasone (PF) (DECADRON) 10 mg/mL injection 10 mg  Once        Route: Intravenous  Ordered Dose: 10 mg       Last MAR action: Given Huy Majid H    07/07/22 1415 07/07/22 1403  oxymetazoline (AFRIN) 0.05 % nasal spray 2 spray  Once        Route: Each Nare  Ordered Dose: 2 spray       Last MAR action: Given Jos Cygan H    07/07/22 1300 07/07/22 1256  levoFLOXacin (LEVAQUIN) 750mg  in D5W IVPB (premix)  Once        Route: Intravenous  Ordered Dose: 750 mg       Last MAR action: Stopped Kiandre Spagnolo H    07/07/22 1300 07/07/22 1256  sodium chloride 0.9 % bolus 1,000 mL  Once        Route: Intravenous  Ordered Dose: 1,000 mL       Last MAR action: Stopped Isaih Bulger H    07/07/22 1130 07/07/22 1115  ketorolac (TORADOL) injection 30 mg  Once        Route: Intravenous  Ordered Dose: 30 mg       Last  MAR action: Given Stanisha Lorenz H    07/07/22 1130 07/07/22 1115  diphenhydrAMINE (BENADRYL) injection 25 mg  Once  Route: Intravenous  Ordered Dose: 25 mg       Last MAR action: Given Noga Fogg H    07/07/22 1130 07/07/22 1115  metoclopramide (REGLAN) injection 10 mg  Once        Route: Intravenous  Ordered Dose: 10 mg       Last MAR action: Given Abir Craine H    07/07/22 1115 07/07/22 1112  sodium chloride 0.9 % bolus 1,000 mL  Once        Route: Intravenous  Ordered Dose: 1,000 mL       Last MAR action: Stopped Jeanene Mena H                 *This note was generated by the Epic EMR system/ Dragon speech recognition and may contain inherent errors or omissions not intended by the user. Grammatical errors, random word insertions, deletions, pronoun errors and incomplete sentences are occasional consequences of this technology due to software limitations. Not all errors are caught or corrected. If there are questions or concerns about the content of this note or information contained within the body of this dictation they should be addressed directly with the author for clarification.    *My documentation is often completed after the patient is no longer under my clinical care. In some cases, the Epic EMR may pull updated results into the above documentation which may not reflect all results or information that was available to me at the time of my medical decision making.          Horatio Pel, MD  07/07/22 [phone]

## 2022-07-07 NOTE — Progress Notes (Signed)
Patient's case was discussed with ER physician. I recommend a CT Maxillofacial with contrast to further assess the patient's sinonasal condition and to rule out the presence of a mass vs. ...

## 2022-07-07 NOTE — Progress Notes (Signed)
Received pt from [facility] via transport, ambulatory transfer to bed, pt independent  Oriented to unit and staff, assessment done, Pt A/Ox4, BP elevated- complains of 5/10 headache, PRN meds given  IVF infusing  FOUR EYES SKIN ASSESSMENT NOTE    [name]  04/10/2001  [id number]    Braden Scale Score: 21    POC Initiated for Risk for Altered Skin: No      Mepilex Dressing Applied to sacrum/heel if any PI risk factors present: No      If Wound / Pressure Injury Present:    Wound / PI Documented on Patient Avatar No      Wound / PI assessment documented in Flowsheet: No      Admitting Physician notified: No      Wound Consult ordered: No      Eilene Ghazi, RN  July 07, 2022  6:50 PM    Second RN Name: Judeth Cornfield    No skin issues noted, no other distress noted, callbell w/in reach, fall precautions in place      Patient Lines/Drains/Airways Status       Active Lines, Drains and Airways       Name Placement date Placement time Site Days    Peripheral IV 07/07/22 20 G Standard Right Antecubital 07/07/22  1129  Antecubital  less than 1                    Shift Events:     Vitals:    07/07/22 1823   BP: (!) 164/91   Pulse: (!) 52   Resp: 19   Temp: 98.2 F (36.8 C)   SpO2: 98%

## 2022-07-07 NOTE — Consults (Signed)
Chief complaint: Headaches    HPI: 21 year old with a history of diabetes who presented to the ER for the third time for headaches and congestion and runny nose.  He was placed on oral antibiotics however his symptoms significantly worsened today.  He reports an intractable headache with photophobia and neck stiffness.  A lumbar puncture was performed in the ER.  ENT was contacted.  A CT maxillofacial with contrast was recommended based on what was seen on CT head.    Past Medical History:   Diagnosis Date    DM (diabetes mellitus)      History reviewed. No pertinent surgical history.  No current facility-administered medications on file prior to encounter.     Current Outpatient Medications on File Prior to Encounter   Medication Sig Dispense Refill    amoxicillin-clavulanate (AUGMENTIN) 875-125 MG per tablet Take 1 tablet by mouth 2 (two) times daily for 10 days 20 tablet 0    butalbital-acetaminophen-caffeine (FIORICET) 50-325-40 MG per tablet Take 1 tablet by mouth every 4 (four) hours as needed for Headaches 15 tablet 0    methylPREDNISolone (MEDROL DOSEPAK) 4 MG tablet Use as directed 21 tablet 0    rizatriptan (MAXALT) 5 MG tablet Take 2 tablets (10 mg) by mouth as needed for Migraine May repeat in 2 hours if needed 10 tablet 0    insulin glargine 100 UNIT/ML pen-injector Inject into the skin nightly       No Known Allergies  Family history: Noncontributory  Social history: Single just moved from [place] no alcohol no vaping no smoking no recreational drug use  Review of systems: As above otherwise negative    PE: NAD  Vitals:    07/07/22 2027   BP: 162/69   Pulse: 61   Resp: 16   Temp: 98.2 F (36.8 C)   SpO2: 97%   AAOx3  Normal mood and affect  No muffled voice  No trismus  No respiratory distress  No drooling  No otorrhea  No epistaxis/inflamed nasal mucosa/right side obstructed  Oral cavity: Moist mucosa  Oropharynx: No pooling of secretions/erythema  Motor strength 5 out of 5 all 4 extremities  Eyes:  No proptosis extraocular motions intact        Recent Labs   Lab 07/07/22  1129   WBC 13.49*   Hgb 15.8   Hematocrit 45.1   Platelets 253     CT maxillofacial with contrast was reviewed.    Assessment: 21 year old with severe headache and photophobia and nasal congestion progressively worsened over the last several days.  Patient is a diabetic.  CT imaging revealed complete opacification of the right sphenoid sinus and ethmoid cavity with some effect on the right lamina.  Plan:  Steroids with tight sugar control  IV antibiotics  Afrin  MRI maxillofacial with and without contrast is also recommended to further understand the disease process  Will most likely recommend surgical intervention over the next several days to understand the disease process not emergently but on a more urgent note.

## 2022-07-07 NOTE — H&P (Signed)
[facility] H&P Note  Patient Info:   Date/Time: 07/07/2022 / 6:15 PM   Admit Date:07/07/2022  Patient Name:[name]   UJW:[id number]   PCP: Charles Sprinkles, MD  Attending Physician:[name], Charles Hazard, DO    Assessment/Plan:   Ethmoid sinusitis  Intractable headache  Diabetes    Admission, ENT Dr.Azadarmaki has been consulted.  IV Decadron 3 times daily and have the patient on Afrin nasal spray as well as Flonase.  ADA diet with Accu-Cheks and sliding scale insulin.  As needed headache medications      Patient has BMI=Body mass index is 37.67 kg/m.  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     DVT Prophylaxis:Medication VTE Prophylaxis Orders: enoxaparin (LOVENOX) syringe 40 mg  Mechanical VTE Prophylaxis Orders: Maintain sequential compression device   Central Line/Foley Catheter/PICC line status: None  Code Status: Full Code  Disposition:home  Type of Admission:Inpatient   Estimated Length of Stay (including stay in the ER receiving treatment): 2 days  Milestones required for discharge: Intractable headache with sinusitis  Clinical Information and History:   Chief Complaint:  Chief Complaint   Patient presents with    Headache    Nausea           Past Medical History:  Past Medical History:   Diagnosis Date    DM (diabetes mellitus)      Past Surgical History:History reviewed. No pertinent surgical history.  Family History:History reviewed. No pertinent family history. Reviewed  Social History:  Social History     Substance and Sexual Activity   Alcohol Use Never     Social History     Substance and Sexual Activity   Drug Use Never     Social History     Tobacco Use   Smoking Status Never   Smokeless Tobacco Never     Social History     Socioeconomic History    Marital status: Single     Spouse name: None    Number of children: None    Years of education: None    Highest education level: None   Occupational History    None   Tobacco Use    Smoking status: Never     Smokeless tobacco: Never   Vaping Use    Vaping Use: Never used   Substance and Sexual Activity    Alcohol use: Never    Drug use: Never    Sexual activity: None   Other Topics Concern    None   Social History Narrative    None     Social Determinants of Health     Financial Resource Strain: Not on file   Food Insecurity: No Food Insecurity (07/07/2022)    Hunger Vital Sign     Worried About Running Out of Food in the Last Year: Never true     Ran Out of Food in the Last Year: Never true   Transportation Needs: No Transportation Needs (07/07/2022)    PRAPARE - Therapist, art (Medical): No     Lack of Transportation (Non-Medical): No   Physical Activity: Not on file   Stress: Not on file   Social Connections: Not on file   Intimate Partner Violence: Not At Risk (07/07/2022)    Humiliation, Afraid, Rape, and Kick questionnaire     Fear of Current or Ex-Partner: No     Emotionally Abused: No     Physically Abused: No  Sexually Abused: No   Housing Stability: Low Risk  (07/07/2022)    Housing Stability Vital Sign     Unable to Pay for Housing in the Last Year: No     Number of Places Lived in the Last Year: 1     Unstable Housing in the Last Year: No     Allergies:No Known Allergies  Medications:  Medications Prior to Admission   Medication Sig Dispense Refill Last Dose    amoxicillin-clavulanate (AUGMENTIN) 875-125 MG per tablet Take 1 tablet by mouth 2 (two) times daily for 10 days 20 tablet 0 07/07/2022    butalbital-acetaminophen-caffeine (FIORICET) 50-325-40 MG per tablet Take 1 tablet by mouth every 4 (four) hours as needed for Headaches 15 tablet 0 07/07/2022    methylPREDNISolone (MEDROL DOSEPAK) 4 MG tablet Use as directed 21 tablet 0 07/07/2022    rizatriptan (MAXALT) 5 MG tablet Take 2 tablets (10 mg) by mouth as needed for Migraine May repeat in 2 hours if needed 10 tablet 0 07/07/2022    insulin glargine 100 UNIT/ML pen-injector Inject into the skin nightly        Clinical  Presentation: HPI:   [name] is a 21 y.o. male who has history of diabetes who presents with intractable headache.  Patient has had intermittent episodes of headache and runny nose as well as cough and sinus congestion for 3 weeks.  Headache became much more severe today which is what prompted emergency room visit.  patient states pain is associated with photophobia.  Patient denies any chest pain, shortness of breath, fevers, cough, chills or any additional symptoms.  Subsequent evaluation concerning for ethmoid sinusitis and ENT was consulted.  We have been asked to admit.    Past Medical History:   Diagnosis Date    DM (diabetes mellitus)      Review of Systems:   Chief Complaint:  Headache and Nausea    Review of Systems   All other systems reviewed and are negative.    Physical Exam:     Vitals:    07/07/22 1030 07/07/22 1300 07/07/22 1330 07/07/22 1630   BP: 133/65 126/72  155/74   Pulse: (!) 59 (!) 51  (!) 57   Resp:       Temp:       SpO2: 97% 100% 100% 100%   Weight:       Height:         Physical Exam:   Constitutional: Patient is oriented to person, place, and time. Patient appears well-developed and well-nourished.   Head: Normocephalic and atraumatic.  Eyes- pupils equal and reactive, extraocular eye movements intact, sclera anicteric  Ears - external ear canals normal, right ear normal, left ear normal  Nose - normal and patent, no erythema, discharge or polyps and normal nontender sinuses  Mouth - mucous membranes moist, pharynx normal without lesions  Neck: Normal range of motion. Neck supple. No JVD present. No bruit or thrill noted on bilateral carotids. No tracheal deviation present. No thyromegaly present.   Cardiovascular: Normal rate, regular rhythm, normal heart sounds and intact distal pulses.  Exam reveals no gallop and no friction rub. No murmur heard.  Pulmonary/Chest: Effort normal and breath sounds normal. No stridor. No respiratory distress. Patient has no wheezes. No crackles  were present.  Exhibits no tenderness on palpation.   Abdominal: Soft. Bowel sounds are normal. Patient exhibits no distension and no mass was palpable. There is no tenderness. There is no rebound and no  guarding.   Musculoskeletal: Normal range of motion. Patient exhibits no edema and no tenderness.   Lymphadenopathy:  Patient has no cervical adenopathy.   Neurological: Patient is alert and oriented to person, place, and time and has normal reflexes. No cranial nerve deficit.  Normal muscle tone. Coordination normal.   Skin: Skin is warm. No rash noted. Patient is not diaphoretic. No erythema. No pallor.   Psychiatric: Has normal mood and affect. Behavior is normal. Judgment and thought content normal    Results of Labs/imaging:   Labs have been reviewed:   Coagulation Profile:   Recent Labs   Lab 07/05/22  1802   PT 12.2   PT INR 1.1       CBC review:   Recent Labs   Lab 07/07/22  1129 07/05/22  1802 07/01/22  2045   WBC 13.49* 10.39* 7.95   Hgb 15.8 14.6 14.8   Hematocrit 45.1 41.2 43.1   Platelets 253 217 247   MCV 79.3 79.1 80.6   RDW 13 12 13    Neutrophils 68.9 77.4 54.5   Neutrophils Absolute 9.30* 8.05* 4.34   Lymphocytes Automated 22.7 16.2 33.5   Eosinophils Automated 0.7 2.1 4.9   Immature Granulocytes 0.4 0.3 0.3   Immature Granulocytes Absolute 0.05 0.03 0.02     Chem Review:  Recent Labs   Lab 07/07/22  1129 07/05/22  1802 07/01/22  2045   Sodium 135 138 137   Potassium 4.0 4.2 3.9   Chloride 99 104 100   CO2 27 26 27    BUN 14.0 13.0 12.0   Creatinine 1.3 1.2 1.2   Glucose 228* 271* 111*   Calcium 9.6 9.3 10.1   Magnesium 1.6  --   --    Bilirubin, Total 0.6  --  0.5   AST (SGOT) 12  --  42*   ALT 26  --  48   Alkaline Phosphatase 65  --  57     Results       Procedure Component Value Units Date/Time    Hemoglobin A1C [[id number]] Collected: 07/07/22 1129    Specimen: Blood Updated: 07/07/22 1429    Narrative:      This is NOT the correct Test for Patients with  Hemoglobinopathy.    CSF Cell Count Tube  #4 [[id number]] Collected: 07/07/22 1259    Specimen: CSF (Lumbar Puncture Spinal Fluid) Updated: 07/07/22 1355     CSF WBC Count Tube #4 2 /cumm      CSF RBC Count Tube #4 1 /cumm      CSF Neutrophils Tube #4 0 %      CSF Lymphocytes Tube #4 80 %      CSF Macrophages Tube #4 20 %     CSF Protein [[id number]]  (Abnormal) Collected: 07/07/22 1259    Specimen: Cerebrospinal Fluid from CSF (Lumbar Puncture Spinal Fluid) Updated: 07/07/22 1353     CSF Protein 52.0 mg/dL      CSF Spun Appearance Colorless    Preliminary Gram Stain [[id number]] Collected: 07/07/22 1259     Updated: 07/07/22 1352    Narrative:      ORDER#: [id number]                                    ORDERED BY: MEHTA, SAMEER  SOURCE: CSF (Lumbar Puncture Spinal Fluid) Tube 3    COLLECTED:  07/07/22 12:59  ANTIBIOTICS AT COLL.:  RECEIVED :  07/07/22 13:51  Preliminary Gram Stain                     FINAL       07/07/22 13:52  07/07/22   No WBCs or organisms seen             For final Gram Stain report, see order #[id number] XC             This is a preliminary result. The gram stain will be reviewed             by the Microbiology Department at the [facility].             See final result under Stain, Gram.      CSF Glucose [[id number]]  (Abnormal) Collected: 07/07/22 1259    Specimen: Cerebrospinal Fluid from CSF (Lumbar Puncture Spinal Fluid) Updated: 07/07/22 1348     CSF Glucose 151 mg/dL     CSF Cell Count Tube #1 [[id number]]  (Abnormal) Collected: 07/07/22 1259    Specimen: CSF (Lumbar Puncture Spinal Fluid) Updated: 07/07/22 1346     CSF WBC Count Tube #1 1 /cumm      CSF RBC Count Tube #1 1 /cumm      CSF Neutrophils Tube #1 0 %      CSF Lymphocytes Tube #1 90 %      CSF Macrophages Tube #1 10 %     Culture + Gram Stain,Aerobic, CSF [[id number]] Collected: 07/07/22 1259    Specimen: Cerebrospinal Fluid from CSF (Lumbar Puncture Spinal Fluid) Updated: 07/07/22 1259    Narrative:      Use middle tube    CSF  Meningitis/Encephalitis Pathogen Panel PCR [[id number]] Collected: 07/07/22 1259    Specimen: CSF (Lumbar Puncture Spinal Fluid) Updated: 07/07/22 1259    Narrative:      Indication for Meningitis/Encephalitis Panel PCR  Testing:->Pretreated CSF sample with concern for acute  bacterial meningitis  Use middle tube.CSF Lumbar Puncture Only    Sedimentation rate (ESR) [[id number]] Collected: 07/07/22 1129    Specimen: Blood Updated: 07/07/22 1205     Sed Rate 4 mm/Hr     Lipase [[id number]] Collected: 07/07/22 1129    Specimen: Blood Updated: 07/07/22 1153     Lipase 22 U/L     Comprehensive metabolic panel [[id number]]  (Abnormal) Collected: 07/07/22 1129    Specimen: Blood Updated: 07/07/22 1153     Glucose 228 mg/dL      BUN 63.0 mg/dL      Creatinine 1.3 mg/dL      Sodium 160 mEq/L      Potassium 4.0 mEq/L      Chloride 99 mEq/L      CO2 27 mEq/L      Calcium 9.6 mg/dL      Protein, Total 7.3 g/dL      Albumin 3.9 g/dL      AST (SGOT) 12 U/L      ALT 26 U/L      Alkaline Phosphatase 65 U/L      Bilirubin, Total 0.6 mg/dL      Globulin 3.4 g/dL      Albumin/Globulin Ratio 1.1     Anion Gap 9.0     eGFR >60.0 mL/min/1.73 m2     C Reactive Protein [[id number]] Collected: 07/07/22 1129    Specimen: Blood Updated: 07/07/22 1153     C-Reactive Protein 0.6 mg/dL     Magnesium [[id number]] Collected: 07/07/22 1129  Specimen: Blood Updated: 07/07/22 1153     Magnesium 1.6 mg/dL     CBC and differential [[id number]]  (Abnormal) Collected: 07/07/22 1129    Specimen: Blood Updated: 07/07/22 1140     WBC 13.49 x10 3/uL      Hgb 15.8 g/dL      Hematocrit 40.9 %      Platelets 253 x10 3/uL      RBC 5.69 x10 6/uL      MCV 79.3 fL      MCH 27.8 pg      MCHC 35.0 g/dL      RDW 13 %      MPV 10.0 fL      Instrument Absolute Neutrophil Count 9.30 x10 3/uL      Neutrophils 68.9 %      Lymphocytes Automated 22.7 %      Monocytes 7.0 %      Eosinophils Automated 0.7 %      Basophils Automated 0.3 %      Immature Granulocytes 0.4 %       Nucleated RBC 0.0 /100 WBC      Neutrophils Absolute 9.30 x10 3/uL      Lymphocytes Absolute Automated 3.06 x10 3/uL      Monocytes Absolute Automated 0.94 x10 3/uL      Eosinophils Absolute Automated 0.10 x10 3/uL      Basophils Absolute Automated 0.04 x10 3/uL      Immature Granulocytes Absolute 0.05 x10 3/uL      Absolute NRBC 0.00 x10 3/uL           Radiology reports have been reviewed:  Radiology Results (24 Hour)       Procedure Component Value Units Date/Time    CT Sinus Facial Bones with Contrast [[id number]] Collected: 07/07/22 1458    Order Status: Completed Updated: 07/07/22 1512    Narrative:      HISTORY: Sinusitis evaluation    COMPARISON: A head CT from the same day, dictated separately.    TECHNIQUE: CT of the sinuses performed with intravenous contrast.  Multiplanar reformatted images were created and reviewed. The following  dose reduction techniques were utilized: automated exposure control and/or  adjustment of the mA and/or KV according to patient size, and the use of an  iterative reconstruction technique.    CONTRAST: iohexol (OMNIPAQUE) 350 MG/ML injection 100 mL     FINDINGS:   There is severe opacification of the mid and posterior right ethmoid air  cells and right sphenoid sinus with occlusion of the right sphenoethmoidal  recess. There is involvement of the right nasal cavity most pronounced  posteriorly. There is increased density within the secretions. There is  bowing of the right lamina papyracea without obvious orbital extension of  disease.    There is mild mucosal thickening of the left ethmoid air cells and left  sphenoid sinus with occlusion of the left sphenoethmoidal recess.    There is mild mucosal thickening of the maxillary sinuses with a potential  retention cyst in the right maxillary sinus. There is occlusion of the  infundibula.    There are secretions in the upper left nasal cavity. There is leftward  nasal septal deviation.    The retromaxillary fat planes and  pterygopalatine fissures are intact. The  adenoids and palatine tonsils are prominent.    There is no abnormal enhancement within the soft tissues. Limited imaging  of the brain is unremarkable. The mastoid air cells and middle ear cavities  are  clear.    There is an impacted tooth with surrounding lucency situated between the  right maxillary cuspid and first bicuspid teeth.      Impression:        1.There is severe mid-posterior right ethmoid air cell and right sphenoid  sinus opacification. There is bowing of the right lamina papyracea. There  is involvement of the right nasal cavity. There is increased attenuation  within the secretions. Diagnostic possibilities include but are not limited  to a mucocele and underlying paranasal sinus disease, severe paranasal  sinus disease with inspissated secretions, fungal disease or polyposis.  2.Mild mucosal thickening within the left ethmoid air cells, left sphenoid  sinus and both maxillary sinuses.  3.Prominent adenoids and palatine tonsils. Correlation with the patient's  immune status may be of benefit.  There is an impacted tooth with surrounding lucency situated between the  right maxillary cuspid and first bicuspid teeth.    Otho Ket, DO  07/07/2022 3:10 PM    CT Head WO Contrast [[id number]] Collected: 07/07/22 1149    Order Status: Completed Updated: 07/07/22 1153    Narrative:      HISTORY: persistent headaches    COMPARISON: CT head 07/05/2022     TECHNIQUE: CT of the head performed without intravenous contrast. The  following dose reduction techniques were utilized: automated exposure  control and/or adjustment of the mA and/or KV according to patient size,  and the use of an iterative reconstruction technique.    CONTRAST: None.    FINDINGS:    Cerebral ventricles, cortical sulci, basilar cisterns are within normal  limits. Gray-white differentiation is within normal limits. No acute  intracranial hemorrhage or mass effect identified. Calvarium  appears  intact. There is again prominent right sphenoid sinus and posterior right  ethmoid air cell disease mildly expanded contours. High attenuation  material within these sinuses is likely on the basis of inspissated  secretions.      Impression:         1.  No acute intracranial hemorrhage, mass effect, or hydrocephalus.    2.  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    Melody Haver, MD  07/07/2022 11:51 AM          CT Sinus Facial Bones with Contrast    Result Date: 07/07/2022  HISTORY: Sinusitis evaluation COMPARISON: A head CT from the same day, dictated separately. TECHNIQUE: CT of the sinuses performed with intravenous contrast. Multiplanar reformatted images were created and reviewed. The following dose reduction techniques were utilized: automated exposure control and/or adjustment of the mA and/or KV according to patient size, and the use of an iterative reconstruction technique. CONTRAST: iohexol (OMNIPAQUE) 350 MG/ML injection 100 mL FINDINGS: There is severe opacification of the mid and posterior right ethmoid air cells and right sphenoid sinus with occlusion of the right sphenoethmoidal recess. There is involvement of the right nasal cavity most pronounced posteriorly. There is increased density within the secretions. There is bowing of the right lamina papyracea without obvious orbital extension of disease. There is mild mucosal thickening of the left ethmoid air cells and left sphenoid sinus with occlusion of the left sphenoethmoidal recess. There is mild mucosal thickening of the maxillary sinuses with a potential retention cyst in the right maxillary sinus. There is occlusion of the infundibula. There are secretions in the upper left nasal cavity. There is leftward nasal septal deviation. The retromaxillary fat planes and pterygopalatine fissures are  intact. The adenoids and palatine tonsils are prominent. There is no abnormal  enhancement within the soft tissues. Limited imaging of the brain is unremarkable. The mastoid air cells and middle ear cavities are clear. There is an impacted tooth with surrounding lucency situated between the right maxillary cuspid and first bicuspid teeth.     1.There is severe mid-posterior right ethmoid air cell and right sphenoid sinus opacification. There is bowing of the right lamina papyracea. There is involvement of the right nasal cavity. There is increased attenuation within the secretions. Diagnostic possibilities include but are not limited to a mucocele and underlying paranasal sinus disease, severe paranasal sinus disease with inspissated secretions, fungal disease or polyposis. 2.Mild mucosal thickening within the left ethmoid air cells, left sphenoid sinus and both maxillary sinuses. 3.Prominent adenoids and palatine tonsils. Correlation with the patient's immune status may be of benefit. There is an impacted tooth with surrounding lucency situated between the right maxillary cuspid and first bicuspid teeth. Otho Ket, DO 07/07/2022 3:10 PM    CT Head WO Contrast    Result Date: 07/07/2022  HISTORY: persistent headaches COMPARISON: CT head 07/05/2022 TECHNIQUE: CT of the head performed without intravenous contrast. The following dose reduction techniques were utilized: automated exposure control and/or adjustment of the mA and/or KV according to patient size, and the use of an iterative reconstruction technique. CONTRAST: None. FINDINGS: Cerebral ventricles, cortical sulci, basilar cisterns are within normal limits. Gray-white differentiation is within normal limits. No acute intracranial hemorrhage or mass effect identified. Calvarium appears intact. There is again prominent right sphenoid sinus and posterior right ethmoid air cell disease mildly expanded contours. High attenuation material within these sinuses is likely on the basis of inspissated secretions.      1.  No acute  intracranial hemorrhage, mass effect, or hydrocephalus. 2.  Prominent right sphenoid sinus and posterior right ethmoid air cell disease, 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 Melody Haver, MD 07/07/2022 11:51 AM    CT Angiogram Head    Result Date: 07/05/2022  HISTORY: Headache x3 weeks. Reportedly 10 out of 10. No improvement despite treatment. NML neuro exam. No prior hx of aneurysm. COMPARISON: None available. Correlation made with unenhanced CT of the head, performed same day reported separately. TECHNIQUE: CT angiogram of the head performed with intravenous contrast. Multiplanar reformatted and 3D maximum intensity projection (MIP) images were created and reviewed. The following dose reduction techniques were utilized: automated exposure control and/or adjustment of the mA and/or KV according to patient size, and the use of an iterative reconstruction technique. CONTRAST: iohexol (OMNIPAQUE) 350 MG/ML injection 80 mL FINDINGS: The intracranial right internal carotid artery is patent.  The intracranial left internal carotid artery is patent.  Both the intracranial vertebral arteries are patent.  The basilar artery is normal in caliber.  The proximal visualized portions of the anterior cerebral, middle cerebral and posterior cerebral arteries are patent. No aneurysm or AVM is demonstrated.     1.Wide patency of the intracranial arterial circulation. 2.No aneurysm or AVM detected. Waynard Edwards, MD 07/05/2022 6:36 PM    CT Head without Contrast    Result Date: 07/05/2022  HISTORY: Persistent headaches for 3 weeks. COMPARISON: Comparison exam dated 07/01/2022. Correlation made with intracranial CT angiography, performed same day reported separately. TECHNIQUE: CT of the head performed without intravenous contrast. The following dose reduction techniques were utilized: automated exposure control and/or adjustment of the mA and/or KV according to patient size, and the use of an iterative reconstruction technique. CONTRAST:  None.  FINDINGS: The ventricles are normal in configuration. No intracranial hemorrhage, mass-effect or midline shift is present. No extra-axial fluid collections are seen. There is no CT evidence of an acute cortical infarct. The parenchyma is unremarkable in attenuation. The calvarium is intact. Redemonstrated is complete opacification of the posterior right-sided ethmoid air cells with near complete opacification of the right sphenoid sinus by membrane thickening and fluid which exhibits heterogeneous hyperattenuation. This is nonspecific and can be seen in the setting of inspissated, protein-rich secretions and/or superimposed fungal infection. This irregular soft tissue extends into the posterior-superior aspect of the right nasal cavity near the nasal choana. There is no abnormal distention or hyperattenuation of either cavernous sinus. The balance of the visualized paranasal sinuses, middle ear cavities and mastoid air cells are well-pneumatized and clear. The orbital contents are normal. Adenoidal hypertrophy is present.      1.No CT evidence of acute intracranial abnormality. 2.Severe right sphenoid and posterior ethmoid sinusitis for which otolaryngology follow-up consultation and evaluation is again advised. An obstructive lesion at the right sphenoethmoidal recess is not excluded and endoscopic or surgical evaluation should be considered. Waynard Edwards, MD 07/05/2022 6:32 PM    CT Head WO Contrast    Result Date: 07/01/2022  HISTORY: Headache, chronic, new features or increased frequency COMPARISON: None available. TECHNIQUE: CT of the head performed without intravenous contrast. The following dose reduction techniques were utilized: automated exposure control and/or adjustment of the mA and/or KV according to patient size, and the use of an iterative reconstruction technique. CONTRAST: None. FINDINGS: The ventricles are normal in configuration. No intracranial hemorrhage, mass-effect or midline shift  is present. No extra-axial fluid collections are seen. There is no CT evidence of an acute cortical infarct. The parenchyma is unremarkable in attenuation. The calvarium is intact. There is complete opacification of the posterior right-sided ethmoid air cells with near complete opacification of the right sphenoid sinus by membrane thickening and fluid which exhibits heterogeneous hyperattenuation. This is nonspecific and can be seen in the setting of inspissated, protein-rich secretions and/or superimposed fungal infection. This irregular soft tissue extends into the posterior-superior aspect of the right nasal cavity near the nasal choana. The balance of the visualized paranasal sinuses, middle ear cavities and mastoid air cells are well-pneumatized and clear. The orbital contents are normal. Adenoidal hypertrophy is present.      1.No CT evidence of acute intracranial abnormality. 2.Right sphenoid and posterior ethmoid sinusitis for which otolaryngology follow-up consultation is advised. An obstructive lesion at the right sphenoethmoidal recess is not excluded. Waynard Edwards, MD 07/01/2022 9:04 PM    Pathology:   Specimens (From admission, onward)      None        \  Last EKG Result       None            Hospitalist:   Signed by:   Abram Sander, DO  07/07/2022 6:15 PM  Time spent in evaluating and managing the care of the patient including face-to-face and non-face-to-face: 40 minutes    *This note was generated by the Epic EMR system/ Dragon speech recognition and may contain inherent errors or omissions not intended by the user. Grammatical errors, random word insertions, deletions, pronoun errors and incomplete sentences are occasional consequences of this technology due to software limitations. Not all errors are caught or corrected. If there are questions or concerns about the content of this note or information contained within the body of this dictation they should be addressed directly  with the  author for clarification.

## 2022-07-07 NOTE — ED to IP RN Note (Signed)
EMERGENCY CARE [facility]  ED NURSING NOTE FOR THE RECEIVING INPATIENT NURSE   ED NURSE Olin Hauser ERA   ED CHARGE RN Arlys John   ADMISSION INFORMATION   [name] is a 21 y.o. male admitted with an ED diagnosis of:    No diagnosis found.     Isolation: None   Allergies: Patient has no known allergies.   Holding Orders confirmed? N/A   Belongings Documented? N/A   Home medications sent to pharmacy confirmed? N/A   NURSING CARE   Patient Comes From:   Mental Status: Home Independent  alert and oriented   ADL: Independent with all ADLs   Ambulation: no difficulty   Pertinent Information  and Safety Concerns:     [place] Violence Risk Level: Low please call RN     CT / NIH   CT Head ordered on this patient?  Yes   NIH/Dysphagia assessment done prior to admission? N/A   VITAL SIGNS (at the time of this note)      Vitals:    07/07/22 1330   BP:    Pulse:    Resp:    Temp:    SpO2: 100%

## 2022-07-08 ENCOUNTER — Inpatient Hospital Stay: Payer: BC Managed Care – PPO

## 2022-07-08 LAB — BASIC METABOLIC PANEL
Anion Gap: 8 (ref 5.0–15.0)
BUN: 15 mg/dL (ref 9.0–28.0)
CO2: 25 mEq/L (ref 17–29)
Calcium: 9.4 mg/dL (ref 8.5–10.5)
Chloride: 102 mEq/L (ref 99–111)
Creatinine: 1.1 mg/dL (ref 0.5–1.5)
Glucose: 311 mg/dL — ABNORMAL HIGH (ref 70–100)
Potassium: 4.4 mEq/L (ref 3.5–5.3)
Sodium: 135 mEq/L (ref 135–145)
eGFR: >60 mL/min/{1.73_m2} (ref >=60)

## 2022-07-08 LAB — CBC AND DIFFERENTIAL
Absolute NRBC: 0 10*3/uL (ref 0.00–0.00)
Basophils Absolute Automated: 0.02 10*3/uL (ref 0.00–0.08)
Basophils Automated: 0.1 %
Eosinophils Absolute Automated: 0 10*3/uL (ref 0.00–0.44)
Eosinophils Automated: 0 %
Hematocrit: 45.6 % (ref 37.6–49.6)
Hgb: 16 g/dL (ref 12.5–17.1)
Immature Granulocytes Absolute: 0.11 10*3/uL — ABNORMAL HIGH (ref 0.00–0.07)
Immature Granulocytes: 0.8 %
Instrument Absolute Neutrophil Count: 11.53 10*3/uL — ABNORMAL HIGH (ref 1.10–6.33)
Lymphocytes Absolute Automated: 1.4 10*3/uL (ref 0.42–3.22)
Lymphocytes Automated: 10.3 %
MCH: 27.9 pg (ref 25.1–33.5)
MCHC: 35.1 g/dL (ref 31.5–35.8)
MCV: 79.6 fL (ref 78.0–96.0)
MPV: 10.9 fL (ref 8.9–12.5)
Monocytes Absolute Automated: 0.51 10*3/uL (ref 0.21–0.85)
Monocytes: 3.8 %
Neutrophils Absolute: 11.53 10*3/uL — ABNORMAL HIGH (ref 1.10–6.33)
Neutrophils: 85 %
Nucleated RBC: 0 /100 WBC (ref 0.0–0.0)
Platelets: 259 10*3/uL (ref 142–346)
RBC: 5.73 10*6/uL (ref 4.20–5.90)
RDW: 12 % (ref 11–15)
WBC: 13.57 10*3/uL — ABNORMAL HIGH (ref 3.10–9.50)

## 2022-07-08 LAB — GLUCOSE WHOLE BLOOD - POCT
Whole Blood Glucose POCT: 282 mg/dL — ABNORMAL HIGH (ref 70–100)
Whole Blood Glucose POCT: 294 mg/dL — ABNORMAL HIGH (ref 70–100)
Whole Blood Glucose POCT: 300 mg/dL — ABNORMAL HIGH (ref 70–100)
Whole Blood Glucose POCT: 304 mg/dL — ABNORMAL HIGH (ref 70–100)
Whole Blood Glucose POCT: 321 mg/dL — ABNORMAL HIGH (ref 70–100)
Whole Blood Glucose POCT: 345 mg/dL — ABNORMAL HIGH (ref 70–100)

## 2022-07-08 MED ORDER — INSULIN GLARGINE 100 UNIT/ML SC SOLN
20.0000 [IU] | Freq: Two times a day (BID) | SUBCUTANEOUS | Status: DC
Start: 2022-07-08 — End: 2022-07-09
  Administered 2022-07-08 – 2022-07-09 (×3): 20 [IU] via SUBCUTANEOUS
  Filled 2022-07-08: qty 20

## 2022-07-08 MED ORDER — PREDNISONE 5 MG PO TABS
15.0000 mg | ORAL_TABLET | Freq: Every day | ORAL | Status: DC
Start: 2022-07-11 — End: 2022-07-10

## 2022-07-08 MED ORDER — PREDNISONE 20 MG PO TABS
20.0000 mg | ORAL_TABLET | Freq: Every day | ORAL | Status: AC
Start: 2022-07-08 — End: 2022-07-10
  Administered 2022-07-08 – 2022-07-10 (×3): 20 mg via ORAL

## 2022-07-08 MED ORDER — SODIUM CHLORIDE 0.9 % IV MBP
3.0000 g | Freq: Four times a day (QID) | INTRAVENOUS | Status: DC
Start: 2022-07-08 — End: 2022-07-13
  Administered 2022-07-08 – 2022-07-13 (×20): 3 g via INTRAVENOUS
  Filled 2022-07-08 (×23): qty 8

## 2022-07-08 MED ORDER — PREDNISONE 5 MG PO TABS
5.0000 mg | ORAL_TABLET | Freq: Every day | ORAL | Status: DC
Start: 2022-07-17 — End: 2022-07-10

## 2022-07-08 MED ORDER — GADOTERATE MEGLUMINE 10 MMOL/20ML IV SOLN (CLARISCAN)
28.0000 mL | Freq: Once | INTRAVENOUS | Status: AC | PRN
Start: 2022-07-08 — End: 2022-07-08
  Administered 2022-07-08: 28 mL via INTRAVENOUS

## 2022-07-08 MED ORDER — RISAQUAD PO CAPS
1.0000 | ORAL_CAPSULE | Freq: Every day | ORAL | Status: DC
Start: 2022-07-08 — End: 2022-07-13
  Administered 2022-07-08 – 2022-07-13 (×6): 1 via ORAL

## 2022-07-08 MED ORDER — INSULIN LISPRO 100 UNIT/ML SOLN (WRAP)
5.0000 [IU] | Freq: Three times a day (TID) | Status: DC
Start: 2022-07-08 — End: 2022-07-13
  Administered 2022-07-08 – 2022-07-13 (×15): 5 [IU] via SUBCUTANEOUS
  Filled 2022-07-08 (×2): qty 15

## 2022-07-08 MED ORDER — PREDNISONE 10 MG PO TABS
10.0000 mg | ORAL_TABLET | Freq: Every day | ORAL | Status: DC
Start: 2022-07-14 — End: 2022-07-10

## 2022-07-08 MED ORDER — ACETAMINOPHEN 325 MG PO TABS
650.0000 mg | ORAL_TABLET | ORAL | Status: DC | PRN
Start: 2022-07-08 — End: 2022-07-13

## 2022-07-08 MED ORDER — KETOROLAC TROMETHAMINE 30 MG/ML IJ SOLN
30.0000 mg | Freq: Four times a day (QID) | INTRAMUSCULAR | Status: AC
Start: 2022-07-08 — End: 2022-07-09
  Administered 2022-07-08 – 2022-07-09 (×4): 30 mg via INTRAVENOUS

## 2022-07-08 MED ORDER — DEXAMETHASONE SODIUM PHOSPHATE 4 MG/ML IJ SOLN (WRAP)
2.0000 mg | Freq: Three times a day (TID) | INTRAMUSCULAR | Status: DC
Start: 2022-07-08 — End: 2022-07-08

## 2022-07-08 NOTE — Plan of Care (Signed)
Problem: Pain interferes with ability to perform ADL  Goal: Pain at adequate level as identified by patient  Outcome: Not Progressing  Flowsheets (Taken 07/08/2022 0127)  Pain at adequate level as identified by patient:   Include patient/patient care companion in decisions related to pain management as needed   Evaluate patient's satisfaction with pain management progress   Evaluate if patient comfort function goal is met   Assess pain on admission, during daily assessment and/or before any "as needed" intervention(s)   Identify patient comfort function goal     Problem: Pain  Goal: Pain at adequate level as identified by patient  Outcome: Not Progressing  Flowsheets (Taken 07/08/2022 0127)  Pain at adequate level as identified by patient:   Include patient/patient care companion in decisions related to pain management as needed   Evaluate patient's satisfaction with pain management progress   Evaluate if patient comfort function goal is met   Assess pain on admission, during daily assessment and/or before any "as needed" intervention(s)   Identify patient comfort function goal

## 2022-07-08 NOTE — Plan of Care (Signed)
Problem: Pain interferes with ability to perform ADL  Goal: Pain at adequate level as identified by patient  Outcome: Progressing

## 2022-07-08 NOTE — UM Notes (Addendum)
Admit to Inpatient (Order [id number]) on 07/07/22    PATIENT NAME: [name], [name]   DOB: March 11, 2001     ADMISSION REVIEW : [facility]  History of present illness:   Pt is a 21 y.o. male admitted to [facility] with Dx of Acute sphenoidal sinusitis, recurrence not specified [J01.30]  Acute ethmoidal sinusitis, recurrence not specified [J01.20]  Acute intractable headache, unspecified headache type [R51.9]. Pt arrived to hospital on (07/07/2022 at 1015) with intractable HA associated with photophobia. CT imaging revealed complete opacification of the right sphenoid sinus and ethmoid cavity with some effect on the right lamina.      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    CT Sinus Facial Bones W   1.There is severe mid-posterior right ethmoid air cell and right sphenoid  sinus opacification. There is bowing of the right lamina papyracea. There  is involvement of the right nasal cavity. There is increased attenuation  within the secretions. Diagnostic possibilities include but are not limited  to a mucocele and underlying paranasal sinus disease, severe paranasal  sinus disease with inspissated secretions, fungal disease or polyposis.  2.Mild mucosal thickening within the left ethmoid air cells, left sphenoid  sinus and both maxillary sinuses.  3.Prominent adenoids and palatine tonsils. Correlation with the  patient's  immune status may be of benefit.  There is an impacted tooth with surrounding lucency situated between the right maxillary cuspid and first bicuspid teeth.    Plan:  ENT consult. IV Decadron 3 times daily and have the patient on Afrin nasal spray as well as Flonase.  ADA diet with Accu-Cheks and sliding scale insulin.  As needed headache medications     Plan per ENT:  Steroids with tight sugar control  IV antibiotics  Afrin  MRI maxillofacial with and without contrast is also recommended to further understand the disease process  Will most likely recommend surgical intervention over the next several days to understand the disease process    ------------------------------------------------------------------------------------   DAY 2 Continued stay review:  CSR Date: 07/08/2022 [facility]    Vitals:  Temp 97.3  HR 51  RR 17  BP 150/81  SpO2 98% on RA    Medications:   Current Facility-Administered Medications   Medication Dose Route Frequency    ampicillin-sulbactam  3 g Intravenous Q6H    dexAMETHasone  8 mg Intravenous Q8H SCH    enoxaparin  40 mg Subcutaneous Daily    fluticasone  1 spray Each Nare Daily    insulin lispro  1-4 Units Subcutaneous QHS    insulin lispro  1-8 Units Subcutaneous TID AC    lactobacillus/streptococcus  1  capsule Oral Daily    oxymetazoline  2 spray Each Nare Once    saline  2 spray Each Nare TID     Labs:   07/08/22 06:47   WBC 13.57 (H)   Glucose 311 (H)     MRI Orbit Face Neck W WO  Complete opacification of the right sphenoid sinus with proteinaceous  nonenhancing contents. 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   Normally takes Lantus 10 units twice a day without any short acting, but after starting steroids increased to 3 times a day   Place on Lantus 20 units twice a day   Start Humalog 5 units 3 times daily on top of medium sliding scale     Intractable headache secondary to sinusitis   Continue Fioricet as needed we will also add Toradol as needed       UTILIZATION REVIEW CONTACT: Devonne Doughty, BSN, RN  Utilization Review   [facility]  [address]  [address]  [place], [place] [address]  Phone: [phone]  Main: [phone], Option 4  Fax: [phone]  Email: [email or web address]  NPI: [id number] / [id number]  Tax ID: [id number]         NOTES TO REVIEWER:    This clinical review is based on/compiled from documentation provided by the treatment team within the patient's medical record.

## 2022-07-08 NOTE — Progress Notes (Signed)
[facility]ITAL[facility] Progress Note  Patient Info:   Date/Time: 07/08/2022 / 9:20 AM   Admit Date:07/07/2022  Patient Name:[name]   EAV:[id number]   PCP: Oneita Hurt None, MD  Attending Physician:Justis Dupas, Harrell Gave, MD  Assessment/Plan:     21 year old with history of diabetes comes in with intractable headache secondary to complete opacification of ethmoid sinuses admitted to sinusitis    Ethmoid sinusitis with complete opacification  MRI face: Complete opacification of the right sphenoid sinus with proteinaceous nonenhancing contents  Started Unasyn 3 g IV every 6  Change dexamethasone to prednisone taper  ENT consulted who might take patient to the OR  2  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  Normally takes Lantus 10 units twice a day without any short acting, but after starting steroids increased to 3 times a day  Place on Lantus 20 units twice a day  Start Humalog 5 units 3 times daily on top of medium sliding scale    Intractable headache secondary to sinusitis  Continue Fioricet as needed we will also add Toradol as needed    Disposition2: Sleep       DVT Prophylaxis:Medication VTE Prophylaxis Orders: enoxaparin (LOVENOX) syringe 40 mg  Mechanical VTE Prophylaxis Orders: Maintain sequential compression device   Central Line/Foley Catheter/PICC line status: None  Code Status: Full Code  Disposition:Planning to discharge patient to home  Type of Admission:Inpatient  Hospital Problems:     Active Hospital Problems    Diagnosis    Acute intractable headache, unspecified headache type     Subjective:   Chief Complaint:  Headache and Nausea    07/08/22   ROS    No chest pain or difficulty breathing  Has some headache but improved from yesterday  No nausea vomiting  Abdominal pain improved  No fever    Objective:     Vitals:    07/08/22 0006 07/08/22 0423 07/08/22 0614 07/08/22 0853   BP: 162/69 171/84 149/73 150/81   Pulse: (!) 56 (!) 44 (!) 46 (!) 51   Resp: 16 18  17    Temp: 98 F (36.7 C) 97.7 F (36.5 C)  97.3 F  (36.3 C)   TempSrc: Oral Oral  Oral   SpO2: 96% 98%  98%   Weight:       Height:         Physical Exam:   Physical Exam    General no apparent distress  Chest clear to auscultation  Heart regular rate rhythm  Abdomen BS present soft  Extremity no edema  Current Medications      Scheduled Meds: PRN Meds:    ampicillin-sulbactam, 3 g, Intravenous, Q6H  dexAMETHasone, 8 mg, Intravenous, Q8H SCH  enoxaparin, 40 mg, Subcutaneous, Daily  fluticasone, 1 spray, Each Nare, Daily  insulin lispro, 1-4 Units, Subcutaneous, QHS  insulin lispro, 1-8 Units, Subcutaneous, TID AC  lactobacillus/streptococcus, 1 capsule, Oral, Daily  oxymetazoline, 2 spray, Each Nare, Once  saline, 2 spray, Each Nare, TID        Continuous Infusions:   acetaminophen, 650 mg, Q4H PRN  benzocaine-menthol, 1 lozenge, Q2H PRN  benzonatate, 100 mg, TID PRN  butalbital-acetaminophen-caffeine, 1 tablet, Q4H PRN  artificial tears (REFRESH PLUS), 1 drop, TID PRN  dextrose, 15 g of glucose, PRN   Or  dextrose, 12.5 g, PRN   Or  dextrose, 12.5 g, PRN   Or  glucagon (rDNA), 1 mg, PRN  dextrose, 15 g of glucose, PRN   Or  dextrose, 12.5 g, PRN  Or  dextrose, 12.5 g, PRN   Or  glucagon (rDNA), 1 mg, PRN  magnesium sulfate, 1 g, PRN  melatonin, 3 mg, QHS PRN  naloxone, 0.2 mg, PRN  potassium & sodium phosphates, 2 packet, PRN  potassium chloride, 0-40 mEq, PRN   And  potassium chloride, 10 mEq, PRN  saline, 2 spray, Q4H PRN          Results of Labs/imaging   Labs for the last 24 hours have been reviewed:   Coagulation Profile:   Recent Labs   Lab 07/05/22  1802   PT 12.2   PT INR 1.1       CBC review:   Recent Labs   Lab 07/08/22  0647 07/07/22  1129 07/05/22  1802 07/01/22  2045   WBC 13.57* 13.49* 10.39* 7.95   Hgb 16.0 15.8 14.6 14.8   Hematocrit 45.6 45.1 41.2 43.1   Platelets 259 253 217 247   MCV 79.6 79.3 79.1 80.6   RDW 12 13 12 13    Neutrophils 85.0 68.9 77.4 54.5   Neutrophils Absolute 11.53* 9.30* 8.05* 4.34   Lymphocytes Automated 10.3 22.7 16.2 33.5    Eosinophils Automated 0.0 0.7 2.1 4.9   Immature Granulocytes 0.8 0.4 0.3 0.3   Immature Granulocytes Absolute 0.11* 0.05 0.03 0.02     Chem Review:  Recent Labs   Lab 07/08/22  0647 07/07/22  1129 07/05/22  1802 07/01/22  2045   Sodium 135 135 138 137   Potassium 4.4 4.0 4.2 3.9   Chloride 102 99 104 100   CO2 25 27 26 27    BUN 15.0 14.0 13.0 12.0   Creatinine 1.1 1.3 1.2 1.2   Glucose 311* 228* 271* 111*   Calcium 9.4 9.6 9.3 10.1   Magnesium  --  1.6  --   --    Bilirubin, Total  --  0.6  --  0.5   AST (SGOT)  --  12  --  42*   ALT  --  26  --  48   Alkaline Phosphatase  --  65  --  57     Results       Procedure Component Value Units Date/Time    Glucose Whole Blood - POCT [[id number]]  (Abnormal) Collected: 07/08/22 0850     Updated: 07/08/22 0908     Whole Blood Glucose POCT 304 mg/dL     Basic Metabolic Panel [[id number]]  (Abnormal) Collected: 07/08/22 0647    Specimen: Blood Updated: 07/08/22 0756     Glucose 311 mg/dL      BUN 40.1 mg/dL      Creatinine 1.1 mg/dL      Calcium 9.4 mg/dL      Sodium 027 mEq/L      Potassium 4.4 mEq/L      Chloride 102 mEq/L      CO2 25 mEq/L      Anion Gap 8.0     eGFR >60.0 mL/min/1.73 m2     CBC and differential [[id number]]  (Abnormal) Collected: 07/08/22 0647    Specimen: Blood Updated: 07/08/22 0742     WBC 13.57 x10 3/uL      Hgb 16.0 g/dL      Hematocrit 47.4 %      Platelets 259 x10 3/uL      RBC 5.73 x10 6/uL      MCV 79.6 fL      MCH 27.9 pg      MCHC 35.1 g/dL  RDW 12 %      MPV 10.9 fL      Instrument Absolute Neutrophil Count 11.53 x10 3/uL      Neutrophils 85.0 %      Lymphocytes Automated 10.3 %      Monocytes 3.8 %      Eosinophils Automated 0.0 %      Basophils Automated 0.1 %      Immature Granulocytes 0.8 %      Nucleated RBC 0.0 /100 WBC      Neutrophils Absolute 11.53 x10 3/uL      Lymphocytes Absolute Automated 1.40 x10 3/uL      Monocytes Absolute Automated 0.51 x10 3/uL      Eosinophils Absolute Automated 0.00 x10 3/uL      Basophils Absolute  Automated 0.02 x10 3/uL      Immature Granulocytes Absolute 0.11 x10 3/uL      Absolute NRBC 0.00 x10 3/uL     Glucose Whole Blood - POCT [[id number]]  (Abnormal) Collected: 07/08/22 0452     Updated: 07/08/22 0456     Whole Blood Glucose POCT 282 mg/dL     Glucose Whole Blood - POCT [[id number]]  (Abnormal) Collected: 07/07/22 2129     Updated: 07/07/22 2146     Whole Blood Glucose POCT 474 mg/dL     CSF Meningitis/Encephalitis Pathogen Panel PCR [[id number]] Collected: 07/07/22 1259    Specimen: CSF (Lumbar Puncture Spinal Fluid) Updated: 07/07/22 2106     CSF Eschericia coli K1 by PCR Not Detected     CSF Haemophilus influenza by PCR Not Detected     CSF Listeria monocytogenes by PCR Not Detected     CSF Neisseria meningitidis (encapsulated) by PCR Not Detected     CSF Streptococcus agalactiae by PCR Not Detected     CSF Streptococcus pneumoniae by PCR Not Detected     CSF Cytomegalovirus by PCR Not Detected     CSF Enterovirus by PCR Not Detected     CSF Herpes simplex virus 1 by PCR Not Detected     CSF Herpes simplex virus 2 by PCR Not Detected     CSF Human herpesvirus 6 by PCR Not Detected     CSF Human parechovirus by PCR Not Detected     CSF Varicella zoster virus by PCR Not Detected     CSF Cryptococcus neoformans/gattii by PCR Not Detected    Narrative:      Indication for Meningitis/Encephalitis Panel PCR  Testing:->Pretreated CSF sample with concern for acute  bacterial meningitis    Culture + Gram Stain,Aerobic, CSF [[id number]] Collected: 07/07/22 1259    Specimen: Cerebrospinal Fluid from CSF (Lumbar Puncture Spinal Fluid) Updated: 07/07/22 2010    Narrative:      ORDER#: [id number]                                    ORDERED BY: MEHTA, SAMEER  SOURCE: CSF (Lumbar Puncture Spinal Fluid)           COLLECTED:  07/07/22 12:59  ANTIBIOTICS AT COLL.:                                RECEIVED :  07/07/22 18:26  Stain, Gram  FINAL       07/07/22 20:10  07/07/22   Stain performed on  Cytospin (concentrated) specimen             Few WBCs             No organisms seen  Culture and Gram Stain, Aerobic, CSF       PENDING      Hemoglobin A1C [[id number]]  (Abnormal) Collected: 07/07/22 1129    Specimen: Blood Updated: 07/07/22 1933     Hemoglobin A1C 9.5 %      Average Estimated Glucose 226.0 mg/dL     Narrative:      This is NOT the correct Test for Patients with  Hemoglobinopathy.    Glucose Whole Blood - POCT [[id number]]  (Abnormal) Collected: 07/07/22 1819     Updated: 07/07/22 1830     Whole Blood Glucose POCT 349 mg/dL     CSF Cell Count Tube #4 [[id number]] Collected: 07/07/22 1259    Specimen: CSF (Lumbar Puncture Spinal Fluid) Updated: 07/07/22 1355     CSF WBC Count Tube #4 2 /cumm      CSF RBC Count Tube #4 1 /cumm      CSF Neutrophils Tube #4 0 %      CSF Lymphocytes Tube #4 80 %      CSF Macrophages Tube #4 20 %     CSF Protein [[id number]]  (Abnormal) Collected: 07/07/22 1259    Specimen: Cerebrospinal Fluid from CSF (Lumbar Puncture Spinal Fluid) Updated: 07/07/22 1353     CSF Protein 52.0 mg/dL      CSF Spun Appearance Colorless    Preliminary Gram Stain [[id number]] Collected: 07/07/22 1259     Updated: 07/07/22 1352    Narrative:      ORDER#: [id number]                                    ORDERED BY: MEHTA, SAMEER  SOURCE: CSF (Lumbar Puncture Spinal Fluid) Tube 3    COLLECTED:  07/07/22 12:59  ANTIBIOTICS AT COLL.:                                RECEIVED :  07/07/22 13:51  Preliminary Gram Stain                     FINAL       07/07/22 13:52  07/07/22   No WBCs or organisms seen             For final Gram Stain report, see order #[id number] XC             This is a preliminary result. The gram stain will be reviewed             by the Microbiology Department at the [facility].             See final result under Stain, Gram.      CSF Glucose [[id number]]  (Abnormal) Collected: 07/07/22 1259    Specimen: Cerebrospinal Fluid from CSF (Lumbar Puncture Spinal Fluid) Updated: 07/07/22  1348     CSF Glucose 151 mg/dL     CSF Cell Count Tube #1 [[id number]]  (Abnormal) Collected: 07/07/22 1259    Specimen: CSF (Lumbar Puncture Spinal Fluid) Updated: 07/07/22 1346     CSF WBC Count Tube #1 1 /cumm  CSF RBC Count Tube #1 1 /cumm      CSF Neutrophils Tube #1 0 %      CSF Lymphocytes Tube #1 90 %      CSF Macrophages Tube #1 10 %     Sedimentation rate (ESR) [[id number]] Collected: 07/07/22 1129    Specimen: Blood Updated: 07/07/22 1205     Sed Rate 4 mm/Hr     Lipase [[id number]] Collected: 07/07/22 1129    Specimen: Blood Updated: 07/07/22 1153     Lipase 22 U/L     Comprehensive metabolic panel [[id number]]  (Abnormal) Collected: 07/07/22 1129    Specimen: Blood Updated: 07/07/22 1153     Glucose 228 mg/dL      BUN 08.6 mg/dL      Creatinine 1.3 mg/dL      Sodium 578 mEq/L      Potassium 4.0 mEq/L      Chloride 99 mEq/L      CO2 27 mEq/L      Calcium 9.6 mg/dL      Protein, Total 7.3 g/dL      Albumin 3.9 g/dL      AST (SGOT) 12 U/L      ALT 26 U/L      Alkaline Phosphatase 65 U/L      Bilirubin, Total 0.6 mg/dL      Globulin 3.4 g/dL      Albumin/Globulin Ratio 1.1     Anion Gap 9.0     eGFR >60.0 mL/min/1.73 m2     C Reactive Protein [[id number]] Collected: 07/07/22 1129    Specimen: Blood Updated: 07/07/22 1153     C-Reactive Protein 0.6 mg/dL     Magnesium [[id number]] Collected: 07/07/22 1129    Specimen: Blood Updated: 07/07/22 1153     Magnesium 1.6 mg/dL     CBC and differential [[id number]]  (Abnormal) Collected: 07/07/22 1129    Specimen: Blood Updated: 07/07/22 1140     WBC 13.49 x10 3/uL      Hgb 15.8 g/dL      Hematocrit 03.4 %      Platelets 253 x10 3/uL      RBC 5.69 x10 6/uL      MCV 79.3 fL      MCH 27.8 pg      MCHC 35.0 g/dL      RDW 13 %      MPV 10.0 fL      Instrument Absolute Neutrophil Count 9.30 x10 3/uL      Neutrophils 68.9 %      Lymphocytes Automated 22.7 %      Monocytes 7.0 %      Eosinophils Automated 0.7 %      Basophils Automated 0.3 %      Immature Granulocytes 0.4 %       Nucleated RBC 0.0 /100 WBC      Neutrophils Absolute 9.30 x10 3/uL      Lymphocytes Absolute Automated 3.06 x10 3/uL      Monocytes Absolute Automated 0.94 x10 3/uL      Eosinophils Absolute Automated 0.10 x10 3/uL      Basophils Absolute Automated 0.04 x10 3/uL      Immature Granulocytes Absolute 0.05 x10 3/uL      Absolute NRBC 0.00 x10 3/uL           Radiology reports have been reviewed:  Radiology Results (24 Hour)       Procedure Component Value Units Date/Time    MRI Orbit Face Neck  Lacretia Nicks WO Contrast [[id number]] Collected: 07/08/22 0756    Order Status: Completed Updated: 07/08/22 0805    Narrative:      HISTORY: Right-sided sinonasal disease    COMPARISON: CT sinus 07/07/2022    TECHNIQUE: MRI of the face performed on a 3.0 Tesla scanner without and  with intravenous contrast.    CONTRAST: gadoterate meglumine (CLARISCAN) 10 MMOL/20ML injection 28 mL    FINDINGS:     The right sphenoid sinus is completely opacified by mildly intrinsically T1  hyperintense, T2 hypointense, nonenhancing material. This may represent a  mucocele versus chronically inspissated secretions. There is no underlying  enhancing mass lesion. No intracranial or sellar extension. There is mild  scattered benign appearing mucosal thickening elsewhere. The visualized  portions of the brain and orbits are unremarkable.      Impression:        Complete opacification of the right sphenoid sinus with proteinaceous  nonenhancing contents. Findings may represent mucocele versus chronically  inspissated secretions. There is no enhancing mass lesion. No intracranial  extension of disease.    Brenton Grills, MD  07/08/2022 8:03 AM    CT Sinus Facial Bones with Contrast [[id number]] Collected: 07/07/22 1458    Order Status: Completed Updated: 07/07/22 1512    Narrative:      HISTORY: Sinusitis evaluation    COMPARISON: A head CT from the same day, dictated separately.    TECHNIQUE: CT of the sinuses performed with intravenous contrast.  Multiplanar  reformatted images were created and reviewed. The following  dose reduction techniques were utilized: automated exposure control and/or  adjustment of the mA and/or KV according to patient size, and the use of an  iterative reconstruction technique.    CONTRAST: iohexol (OMNIPAQUE) 350 MG/ML injection 100 mL     FINDINGS:   There is severe opacification of the mid and posterior right ethmoid air  cells and right sphenoid sinus with occlusion of the right sphenoethmoidal  recess. There is involvement of the right nasal cavity most pronounced  posteriorly. There is increased density within the secretions. There is  bowing of the right lamina papyracea without obvious orbital extension of  disease.    There is mild mucosal thickening of the left ethmoid air cells and left  sphenoid sinus with occlusion of the left sphenoethmoidal recess.    There is mild mucosal thickening of the maxillary sinuses with a potential  retention cyst in the right maxillary sinus. There is occlusion of the  infundibula.    There are secretions in the upper left nasal cavity. There is leftward  nasal septal deviation.    The retromaxillary fat planes and pterygopalatine fissures are intact. The  adenoids and palatine tonsils are prominent.    There is no abnormal enhancement within the soft tissues. Limited imaging  of the brain is unremarkable. The mastoid air cells and middle ear cavities  are clear.    There is an impacted tooth with surrounding lucency situated between the  right maxillary cuspid and first bicuspid teeth.      Impression:        1.There is severe mid-posterior right ethmoid air cell and right sphenoid  sinus opacification. There is bowing of the right lamina papyracea. There  is involvement of the right nasal cavity. There is increased attenuation  within the secretions. Diagnostic possibilities include but are not limited  to a mucocele and underlying paranasal sinus disease, severe paranasal  sinus disease with  inspissated secretions, fungal disease or  polyposis.  2.Mild mucosal thickening within the left ethmoid air cells, left sphenoid  sinus and both maxillary sinuses.  3.Prominent adenoids and palatine tonsils. Correlation with the patient's  immune status may be of benefit.  There is an impacted tooth with surrounding lucency situated between the  right maxillary cuspid and first bicuspid teeth.    Otho Ket, DO  07/07/2022 3:10 PM    CT Head WO Contrast [[id number]] Collected: 07/07/22 1149    Order Status: Completed Updated: 07/07/22 1153    Narrative:      HISTORY: persistent headaches    COMPARISON: CT head 07/05/2022     TECHNIQUE: CT of the head performed without intravenous contrast. The  following dose reduction techniques were utilized: automated exposure  control and/or adjustment of the mA and/or KV according to patient size,  and the use of an iterative reconstruction technique.    CONTRAST: None.    FINDINGS:    Cerebral ventricles, cortical sulci, basilar cisterns are within normal  limits. Gray-white differentiation is within normal limits. No acute  intracranial hemorrhage or mass effect identified. Calvarium appears  intact. There is again prominent right sphenoid sinus and posterior right  ethmoid air cell disease mildly expanded contours. High attenuation  material within these sinuses is likely on the basis of inspissated  secretions.      Impression:         1.  No acute intracranial hemorrhage, mass effect, or hydrocephalus.    2.  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    Melody Haver, MD  07/07/2022 11:51 AM          MRI Orbit Face Neck W WO Contrast    Result Date: 07/08/2022  HISTORY: Right-sided sinonasal disease COMPARISON: CT sinus 07/07/2022 TECHNIQUE: MRI of the face performed on a 3.0 Tesla scanner without and with intravenous contrast. CONTRAST: gadoterate meglumine (CLARISCAN) 10 MMOL/20ML injection 28 mL FINDINGS:  The right sphenoid sinus is completely opacified by mildly intrinsically T1 hyperintense, T2 hypointense, nonenhancing material. This may represent a mucocele versus chronically inspissated secretions. There is no underlying enhancing mass lesion. No intracranial or sellar extension. There is mild scattered benign appearing mucosal thickening elsewhere. The visualized portions of the brain and orbits are unremarkable.     Complete opacification of the right sphenoid sinus with proteinaceous nonenhancing contents. Findings may represent mucocele versus chronically inspissated secretions. There is no enhancing mass lesion. No intracranial extension of disease. Brenton Grills, MD 07/08/2022 8:03 AM    CT Sinus Facial Bones with Contrast    Result Date: 07/07/2022  HISTORY: Sinusitis evaluation COMPARISON: A head CT from the same day, dictated separately. TECHNIQUE: CT of the sinuses performed with intravenous contrast. Multiplanar reformatted images were created and reviewed. The following dose reduction techniques were utilized: automated exposure control and/or adjustment of the mA and/or KV according to patient size, and the use of an iterative reconstruction technique. CONTRAST: iohexol (OMNIPAQUE) 350 MG/ML injection 100 mL FINDINGS: There is severe opacification of the mid and posterior right ethmoid air cells and right sphenoid sinus with occlusion of the right sphenoethmoidal recess. There is involvement of the right nasal cavity most pronounced posteriorly. There is increased density within the secretions. There is bowing of the right lamina papyracea without obvious orbital extension of disease. There is mild mucosal thickening of the left ethmoid air cells and left sphenoid sinus with occlusion of the left sphenoethmoidal recess. There is mild  mucosal thickening of the maxillary sinuses with a potential retention cyst in the right maxillary sinus. There is occlusion of the infundibula. There are secretions in  the upper left nasal cavity. There is leftward nasal septal deviation. The retromaxillary fat planes and pterygopalatine fissures are intact. The adenoids and palatine tonsils are prominent. There is no abnormal enhancement within the soft tissues. Limited imaging of the brain is unremarkable. The mastoid air cells and middle ear cavities are clear. There is an impacted tooth with surrounding lucency situated between the right maxillary cuspid and first bicuspid teeth.     1.There is severe mid-posterior right ethmoid air cell and right sphenoid sinus opacification. There is bowing of the right lamina papyracea. There is involvement of the right nasal cavity. There is increased attenuation within the secretions. Diagnostic possibilities include but are not limited to a mucocele and underlying paranasal sinus disease, severe paranasal sinus disease with inspissated secretions, fungal disease or polyposis. 2.Mild mucosal thickening within the left ethmoid air cells, left sphenoid sinus and both maxillary sinuses. 3.Prominent adenoids and palatine tonsils. Correlation with the patient's immune status may be of benefit. There is an impacted tooth with surrounding lucency situated between the right maxillary cuspid and first bicuspid teeth. Otho Ket, DO 07/07/2022 3:10 PM    CT Head WO Contrast    Result Date: 07/07/2022  HISTORY: persistent headaches COMPARISON: CT head 07/05/2022 TECHNIQUE: CT of the head performed without intravenous contrast. The following dose reduction techniques were utilized: automated exposure control and/or adjustment of the mA and/or KV according to patient size, and the use of an iterative reconstruction technique. CONTRAST: None. FINDINGS: Cerebral ventricles, cortical sulci, basilar cisterns are within normal limits. Gray-white differentiation is within normal limits. No acute intracranial hemorrhage or mass effect identified. Calvarium appears intact. There is again prominent right  sphenoid sinus and posterior right ethmoid air cell disease mildly expanded contours. High attenuation material within these sinuses is likely on the basis of inspissated secretions.      1.  No acute intracranial hemorrhage, mass effect, or hydrocephalus. 2.  Prominent right sphenoid sinus and posterior right ethmoid air cell disease, 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 Melody Haver, MD 07/07/2022 11:51 AM    CT Angiogram Head    Result Date: 07/05/2022  HISTORY: Headache x3 weeks. Reportedly 10 out of 10. No improvement despite treatment. NML neuro exam. No prior hx of aneurysm. COMPARISON: None available. Correlation made with unenhanced CT of the head, performed same day reported separately. TECHNIQUE: CT angiogram of the head performed with intravenous contrast. Multiplanar reformatted and 3D maximum intensity projection (MIP) images were created and reviewed. The following dose reduction techniques were utilized: automated exposure control and/or adjustment of the mA and/or KV according to patient size, and the use of an iterative reconstruction technique. CONTRAST: iohexol (OMNIPAQUE) 350 MG/ML injection 80 mL FINDINGS: The intracranial right internal carotid artery is patent.  The intracranial left internal carotid artery is patent.  Both the intracranial vertebral arteries are patent.  The basilar artery is normal in caliber.  The proximal visualized portions of the anterior cerebral, middle cerebral and posterior cerebral arteries are patent. No aneurysm or AVM is demonstrated.     1.Wide patency of the intracranial arterial circulation. 2.No aneurysm or AVM detected. Waynard Edwards, MD 07/05/2022 6:36 PM    CT Head without Contrast    Result Date: 07/05/2022  HISTORY: Persistent headaches for 3 weeks. COMPARISON: Comparison exam dated 07/01/2022. Correlation made with intracranial CT angiography, performed  same day reported separately. TECHNIQUE: CT of the head performed without intravenous contrast. The  following dose reduction techniques were utilized: automated exposure control and/or adjustment of the mA and/or KV according to patient size, and the use of an iterative reconstruction technique. CONTRAST: None. FINDINGS: The ventricles are normal in configuration. No intracranial hemorrhage, mass-effect or midline shift is present. No extra-axial fluid collections are seen. There is no CT evidence of an acute cortical infarct. The parenchyma is unremarkable in attenuation. The calvarium is intact. Redemonstrated is complete opacification of the posterior right-sided ethmoid air cells with near complete opacification of the right sphenoid sinus by membrane thickening and fluid which exhibits heterogeneous hyperattenuation. This is nonspecific and can be seen in the setting of inspissated, protein-rich secretions and/or superimposed fungal infection. This irregular soft tissue extends into the posterior-superior aspect of the right nasal cavity near the nasal choana. There is no abnormal distention or hyperattenuation of either cavernous sinus. The balance of the visualized paranasal sinuses, middle ear cavities and mastoid air cells are well-pneumatized and clear. The orbital contents are normal. Adenoidal hypertrophy is present.      1.No CT evidence of acute intracranial abnormality. 2.Severe right sphenoid and posterior ethmoid sinusitis for which otolaryngology follow-up consultation and evaluation is again advised. An obstructive lesion at the right sphenoethmoidal recess is not excluded and endoscopic or surgical evaluation should be considered. Waynard Edwards, MD 07/05/2022 6:32 PM    CT Head WO Contrast    Result Date: 07/01/2022  HISTORY: Headache, chronic, new features or increased frequency COMPARISON: None available. TECHNIQUE: CT of the head performed without intravenous contrast. The following dose reduction techniques were utilized: automated exposure control and/or adjustment of the mA and/or KV  according to patient size, and the use of an iterative reconstruction technique. CONTRAST: None. FINDINGS: The ventricles are normal in configuration. No intracranial hemorrhage, mass-effect or midline shift is present. No extra-axial fluid collections are seen. There is no CT evidence of an acute cortical infarct. The parenchyma is unremarkable in attenuation. The calvarium is intact. There is complete opacification of the posterior right-sided ethmoid air cells with near complete opacification of the right sphenoid sinus by membrane thickening and fluid which exhibits heterogeneous hyperattenuation. This is nonspecific and can be seen in the setting of inspissated, protein-rich secretions and/or superimposed fungal infection. This irregular soft tissue extends into the posterior-superior aspect of the right nasal cavity near the nasal choana. The balance of the visualized paranasal sinuses, middle ear cavities and mastoid air cells are well-pneumatized and clear. The orbital contents are normal. Adenoidal hypertrophy is present.      1.No CT evidence of acute intracranial abnormality. 2.Right sphenoid and posterior ethmoid sinusitis for which otolaryngology follow-up consultation is advised. An obstructive lesion at the right sphenoethmoidal recess is not excluded. Waynard Edwards, MD 07/01/2022 9:04 PM    Pathology:   Specimens (From admission, onward)      None          Last EKG Result       None          Hospitalist   Signed by:   Bertrum Sol, MD  07/08/2022 9:20 AM  Time spent in evaluating and managing the care of the patient including face-to-face and non-face-to-face: 50 minutes    *This note was generated by the Epic EMR system/ Dragon speech recognition and may contain inherent errors or omissions not intended by the user. Grammatical errors, random word insertions, deletions, pronoun errors and incomplete sentences  are occasional consequences of this technology due to software limitations. Not all  errors are caught or corrected. If there are questions or concerns about the content of this note or information contained within the body of this dictation they should be addressed directly with the author for clarification

## 2022-07-08 NOTE — Plan of Care (Signed)
Problem: Pain interferes with ability to perform ADL  Goal: Pain at adequate level as identified by patient  Flowsheets (Taken 07/08/2022 2253)  Pain at adequate level as identified by patient:   Identify patient comfort function goal   Assess for risk of opioid induced respiratory depression, including snoring/sleep apnea. Alert healthcare team of risk factors identified.   Assess pain on admission, during daily assessment and/or before any "as needed" intervention(s)   Reassess pain within 30-60 minutes of any procedure/intervention, per Pain Assessment, Intervention, Reassessment (AIR) Cycle   Evaluate if patient comfort function goal is met   Evaluate patient's satisfaction with pain management progress   Offer non-pharmacological pain management interventions   Consult/collaborate with Pain Service     Problem: Side Effects from Pain Analgesia  Goal: Patient will experience minimal side effects of analgesic therapy  Flowsheets (Taken 07/08/2022 2253)  Patient will experience minimal side effects of analgesic therapy:   Monitor/assess patient's respiratory status (RR depth, effort, breath sounds)   Prevent/manage side effects per LIP orders (i.e. nausea, vomiting, pruritus, constipation, urinary retention, etc.)   Evaluate for opioid-induced sedation with appropriate assessment tool (i.e. POSS)   Assess for changes in cognitive function     Problem: Safety  Goal: Patient will be free from injury during hospitalization  Flowsheets (Taken 07/08/2022 2253)  Patient will be free from injury during hospitalization:   Assess patient's risk for falls and implement fall prevention plan of care per policy   Provide and maintain safe environment   Ensure appropriate safety devices are available at the bedside   Use appropriate transfer methods   Include patient/ family/ care giver in decisions related to safety   Hourly rounding   Assess for patients risk for elopement and implement Elopement Risk Plan per policy    Provide alternative method of communication if needed (communication boards, writing)  Goal: Patient will be free from infection during hospitalization  Flowsheets (Taken 07/08/2022 2253)  Free from Infection during hospitalization:   Assess and monitor for signs and symptoms of infection   Monitor lab/diagnostic results   Monitor all insertion sites (i.e. indwelling lines, tubes, urinary catheters, and drains)   Encourage patient and family to use good hand hygiene technique     Problem: Pain  Goal: Pain at adequate level as identified by patient  Flowsheets (Taken 07/08/2022 2253)  Pain at adequate level as identified by patient:   Identify patient comfort function goal   Assess for risk of opioid induced respiratory depression, including snoring/sleep apnea. Alert healthcare team of risk factors identified.   Assess pain on admission, during daily assessment and/or before any "as needed" intervention(s)   Reassess pain within 30-60 minutes of any procedure/intervention, per Pain Assessment, Intervention, Reassessment (AIR) Cycle   Evaluate if patient comfort function goal is met   Evaluate patient's satisfaction with pain management progress   Offer non-pharmacological pain management interventions   Consult/collaborate with Pain Service

## 2022-07-09 DIAGNOSIS — R519 Headache, unspecified: Secondary | ICD-10-CM

## 2022-07-09 LAB — CBC AND DIFFERENTIAL
Absolute NRBC: 0 10*3/uL (ref 0.00–0.00)
Basophils Absolute Automated: 0.04 10*3/uL (ref 0.00–0.08)
Basophils Automated: 0.3 %
Eosinophils Absolute Automated: 0.03 10*3/uL (ref 0.00–0.44)
Eosinophils Automated: 0.2 %
Hematocrit: 43.7 % (ref 37.6–49.6)
Hgb: 15.3 g/dL (ref 12.5–17.1)
Immature Granulocytes Absolute: 0.15 10*3/uL — ABNORMAL HIGH (ref 0.00–0.07)
Immature Granulocytes: 0.9 %
Instrument Absolute Neutrophil Count: 12.1 10*3/uL — ABNORMAL HIGH (ref 1.10–6.33)
Lymphocytes Absolute Automated: 2.49 10*3/uL (ref 0.42–3.22)
Lymphocytes Automated: 15.7 %
MCH: 28 pg (ref 25.1–33.5)
MCHC: 35 g/dL (ref 31.5–35.8)
MCV: 79.9 fL (ref 78.0–96.0)
MPV: 10.9 fL (ref 8.9–12.5)
Monocytes Absolute Automated: 1.07 10*3/uL — ABNORMAL HIGH (ref 0.21–0.85)
Monocytes: 6.7 %
Neutrophils Absolute: 12.1 10*3/uL — ABNORMAL HIGH (ref 1.10–6.33)
Neutrophils: 76.2 %
Nucleated RBC: 0 /100 WBC (ref 0.0–0.0)
Platelets: 251 10*3/uL (ref 142–346)
RBC: 5.47 10*6/uL (ref 4.20–5.90)
RDW: 12 % (ref 11–15)
WBC: 15.88 10*3/uL — ABNORMAL HIGH (ref 3.10–9.50)

## 2022-07-09 LAB — BASIC METABOLIC PANEL
Anion Gap: 6 (ref 5.0–15.0)
BUN: 22 mg/dL (ref 9.0–28.0)
CO2: 26 mEq/L (ref 17–29)
Calcium: 9.1 mg/dL (ref 8.5–10.5)
Chloride: 104 mEq/L (ref 99–111)
Creatinine: 1.3 mg/dL (ref 0.5–1.5)
Glucose: 350 mg/dL — ABNORMAL HIGH (ref 70–100)
Potassium: 4.3 mEq/L (ref 3.5–5.3)
Sodium: 136 mEq/L (ref 135–145)
eGFR: >60 mL/min/{1.73_m2} (ref >=60)

## 2022-07-09 LAB — GLUCOSE WHOLE BLOOD - POCT
Whole Blood Glucose POCT: 281 mg/dL — ABNORMAL HIGH (ref 70–100)
Whole Blood Glucose POCT: 230 mg/dL — ABNORMAL HIGH (ref 70–100)
Whole Blood Glucose POCT: 227 mg/dL — ABNORMAL HIGH (ref 70–100)
Whole Blood Glucose POCT: 197 mg/dL — ABNORMAL HIGH (ref 70–100)
Whole Blood Glucose POCT: 251 mg/dL — ABNORMAL HIGH (ref 70–100)

## 2022-07-09 MED ORDER — GLUCOSE 40 % PO GEL (WRAP)
15.0000 g | ORAL | Status: DC | PRN
Start: 2022-07-09 — End: 2022-07-13

## 2022-07-09 MED ORDER — FLUTICASONE PROPIONATE 50 MCG/ACT NA SUSP
1.0000 | Freq: Two times a day (BID) | NASAL | Status: DC
Start: 2022-07-09 — End: 2022-07-13
  Administered 2022-07-09 – 2022-07-13 (×8): 1 via NASAL

## 2022-07-09 MED ORDER — GLUCAGON 1 MG IJ SOLR (WRAP)
1.0000 mg | INTRAMUSCULAR | Status: DC | PRN
Start: 2022-07-09 — End: 2022-07-13

## 2022-07-09 MED ORDER — INSULIN LISPRO 100 UNIT/ML SOLN (WRAP)
2.0000 [IU] | Freq: Three times a day (TID) | Status: DC
Start: 2022-07-09 — End: 2022-07-13
  Administered 2022-07-09: 2 [IU] via SUBCUTANEOUS
  Administered 2022-07-10: 8 [IU] via SUBCUTANEOUS
  Administered 2022-07-10 – 2022-07-11 (×2): 4 [IU] via SUBCUTANEOUS
  Administered 2022-07-11 – 2022-07-12 (×2): 2 [IU] via SUBCUTANEOUS
  Administered 2022-07-12: 6 [IU] via SUBCUTANEOUS
  Administered 2022-07-13: 4 [IU] via SUBCUTANEOUS
  Filled 2022-07-09: qty 45
  Filled 2022-07-09: qty 12

## 2022-07-09 MED ORDER — DEXTROSE 50 % IV SOLN
12.5000 g | INTRAVENOUS | Status: DC | PRN
Start: 2022-07-09 — End: 2022-07-13

## 2022-07-09 MED ORDER — INSULIN GLARGINE 100 UNIT/ML SC SOLN
25.0000 [IU] | Freq: Two times a day (BID) | SUBCUTANEOUS | Status: DC
Start: 2022-07-10 — End: 2022-07-13
  Administered 2022-07-10 – 2022-07-13 (×6): 25 [IU] via SUBCUTANEOUS
  Filled 2022-07-09 (×2): qty 25

## 2022-07-09 MED ORDER — DEXTROSE 10 % IV BOLUS
12.5000 g | INTRAVENOUS | Status: DC | PRN
Start: 2022-07-09 — End: 2022-07-13

## 2022-07-09 MED ORDER — INSULIN LISPRO 100 UNIT/ML SOLN (WRAP)
1.0000 [IU] | Freq: Every evening | Status: DC
Start: 2022-07-09 — End: 2022-07-13
  Administered 2022-07-10: 5 [IU] via SUBCUTANEOUS
  Administered 2022-07-10 – 2022-07-11 (×2): 4 [IU] via SUBCUTANEOUS
  Administered 2022-07-12: 2 [IU] via SUBCUTANEOUS
  Filled 2022-07-09: qty 15
  Filled 2022-07-09: qty 12

## 2022-07-09 NOTE — Progress Notes (Addendum)
[facility]PITALIST Progress Note  Patient Info:   Date/Time: 07/09/2022 / 5:30 PM   Admit Date:07/07/2022  Patient Name:[name]   XNA:[id number]   PCP: Oneita Hurt None, MD  Attending Physician:Lovel Suazo, Harrell Gave, MD  Assessment/Plan:     21 year old with history of diabetes comes in with intractable headache secondary to complete opacification of ethmoid sinuses admitted to sinusitis    Ethmoid sinusitis with complete opacification  MRI face: Complete opacification of the right sphenoid sinus with proteinaceous nonenhancing contents  Started Unasyn 3 g IV every 6  Change dexamethasone to prednisone taper  Discussed with ENT in detail about plan of care; contin steroids and iv abx,  might take patient to the OR    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  Normally takes Lantus 10 units twice a day without any short acting, but after starting steroids increased to 3 times a day  Increase Lantus 25 units twice a day  Start Humalog 5 units 3 times daily; increase to high sliding scale    Intractable headache secondary to sinusitis  Continue Fioricet as needed we will also add Toradol as needed    Disposition:        DVT Prophylaxis:Medication VTE Prophylaxis Orders: enoxaparin (LOVENOX) syringe 40 mg  Mechanical VTE Prophylaxis Orders: Maintain sequential compression device   Central Line/Foley Catheter/PICC line status: None  Code Status: Full Code  Disposition:Planning to discharge patient to home  Type of Admission:Inpatient  Hospital Problems:     Active Hospital Problems    Diagnosis    Acute intractable headache, unspecified headache type     Subjective:   Chief Complaint:  Headache and Nausea    07/09/22   ROS    No chest pain or difficulty breathing  Has some headache but improved from yesterday  No nausea vomiting  Abdominal pain improved  No fever    Objective:     Vitals:    07/09/22 0435 07/09/22 0730 07/09/22 1109 07/09/22 1618   BP: 157/79 154/84 145/81 134/78   Pulse: (!) 48 (!) 40 (!) 44 61   Resp: 18 16 16 16     Temp: 97.5 F (36.4 C) 98.4 F (36.9 C) 97.7 F (36.5 C) 97.7 F (36.5 C)   TempSrc: Oral Oral Oral Oral   SpO2: 97% 97% 98% 96%   Weight:       Height:         Physical Exam:   Physical Exam    General no apparent distress  Chest clear to auscultation  Heart regular rate rhythm  Abdomen BS present soft  Extremity no edema  Current Medications      Scheduled Meds: PRN Meds:    ampicillin-sulbactam, 3 g, Intravenous, Q6H  enoxaparin, 40 mg, Subcutaneous, Daily  fluticasone, 1 spray, Each Nare, Daily  insulin glargine, 20 Units, Subcutaneous, Q12H  insulin lispro, 1-4 Units, Subcutaneous, QHS  insulin lispro, 1-8 Units, Subcutaneous, TID AC  insulin lispro, 5 Units, Subcutaneous, TID AC  lactobacillus/streptococcus, 1 capsule, Oral, Daily  oxymetazoline, 2 spray, Each Nare, Once  predniSONE, 20 mg, Oral, Daily   Followed by  ON 07/11/2022] predniSONE, 15 mg, Oral, Daily   Followed by  07/13/2022 ON 07/14/2022] predniSONE, 10 mg, Oral, Daily   Followed by  07/16/2022 ON 07/17/2022] predniSONE, 5 mg, Oral, Daily  saline, 2 spray, Each Nare, TID        Continuous Infusions:   acetaminophen, 650 mg, Q4H PRN  benzocaine-menthol, 1 lozenge, Q2H PRN  benzonatate, 100 mg, TID  PRN  butalbital-acetaminophen-caffeine, 1 tablet, Q4H PRN  artificial tears (REFRESH PLUS), 1 drop, TID PRN  dextrose, 15 g of glucose, PRN   Or  dextrose, 12.5 g, PRN   Or  dextrose, 12.5 g, PRN   Or  glucagon (rDNA), 1 mg, PRN  magnesium sulfate, 1 g, PRN  melatonin, 3 mg, QHS PRN  naloxone, 0.2 mg, PRN  potassium & sodium phosphates, 2 packet, PRN  potassium chloride, 0-40 mEq, PRN   And  potassium chloride, 10 mEq, PRN  saline, 2 spray, Q4H PRN          Results of Labs/imaging   Labs for the last 24 hours have been reviewed:   Coagulation Profile:   Recent Labs   Lab 07/05/22  1802   PT 12.2   PT INR 1.1         CBC review:   Recent Labs   Lab 07/09/22  0640 07/08/22  0647 07/07/22  1129 07/05/22  1802   WBC 15.88* 13.57* 13.49* 10.39*   Hgb  15.3 16.0 15.8 14.6   Hematocrit 43.7 45.6 45.1 41.2   Platelets 251 259 253 217   MCV 79.9 79.6 79.3 79.1   RDW 12 12 13 12    Neutrophils 76.2 85.0 68.9 77.4   Neutrophils Absolute 12.10* 11.53* 9.30* 8.05*   Lymphocytes Automated 15.7 10.3 22.7 16.2   Eosinophils Automated 0.2 0.0 0.7 2.1   Immature Granulocytes 0.9 0.8 0.4 0.3   Immature Granulocytes Absolute 0.15* 0.11* 0.05 0.03       Chem Review:  Recent Labs   Lab 07/09/22  0640 07/08/22  0647 07/07/22  1129 07/05/22  1802   Sodium 136 135 135 138   Potassium 4.3 4.4 4.0 4.2   Chloride 104 102 99 104   CO2 26 25 27 26    BUN 22.0 15.0 14.0 13.0   Creatinine 1.3 1.1 1.3 1.2   Glucose 350* 311* 228* 271*   Calcium 9.1 9.4 9.6 9.3   Magnesium  --   --  1.6  --    Bilirubin, Total  --   --  0.6  --    AST (SGOT)  --   --  12  --    ALT  --   --  26  --    Alkaline Phosphatase  --   --  65  --        Results       Procedure Component Value Units Date/Time    Glucose Whole Blood - POCT [[id number]][[id number]]  (Abnormal) Collected: 07/09/22 1619     Updated: 07/09/22 1625     Whole Blood Glucose POCT 227 mg/dL     Culture + Gram Stain,Aerobic, CSF [[id number]][[id number]] Collected: 07/07/22 1259    Specimen: Cerebrospinal Fluid from CSF (Lumbar Puncture Spinal Fluid) Updated: 07/09/22 1556    Narrative:      ORDER#: [id number]                                    ORDERED BY: MEHTA, SAMEER  SOURCE: CSF (Lumbar Puncture Spinal Fluid)           COLLECTED:  07/07/22 12:59  ANTIBIOTICS AT COLL.:                                RECEIVED :  07/07/22 18:26  Stain,  Gram                                FINAL       07/07/22 20:10  07/07/22   Stain performed on Cytospin (concentrated) specimen             Few WBCs             No organisms seen  Culture and Gram Stain, Aerobic, CSF       PRELIM      07/09/22 15:56  07/08/22   Culture no growth to date, Final report to follow  07/09/22   Culture no growth to date, Final report to follow      Glucose Whole Blood - POCT [[id number]]  (Abnormal) Collected:  07/09/22 1110     Updated: 07/09/22 1129     Whole Blood Glucose POCT 230 mg/dL     Glucose Whole Blood - POCT [[id number]]  (Abnormal) Collected: 07/09/22 0735     Updated: 07/09/22 0800     Whole Blood Glucose POCT 281 mg/dL     Basic Metabolic Panel [[id number]]  (Abnormal) Collected: 07/09/22 0640    Specimen: Blood Updated: 07/09/22 0750     Glucose 350 mg/dL      BUN 01.0 mg/dL      Creatinine 1.3 mg/dL      Calcium 9.1 mg/dL      Sodium 932 mEq/L      Potassium 4.3 mEq/L      Chloride 104 mEq/L      CO2 26 mEq/L      Anion Gap 6.0     eGFR >60.0 mL/min/1.73 m2     CBC and differential [[id number]]  (Abnormal) Collected: 07/09/22 0640    Specimen: Blood Updated: 07/09/22 0723     WBC 15.88 x10 3/uL      Hgb 15.3 g/dL      Hematocrit 54.2 %      Platelets 251 x10 3/uL      RBC 5.47 x10 6/uL      MCV 79.9 fL      MCH 28.0 pg      MCHC 35.0 g/dL      RDW 12 %      MPV 10.9 fL      Instrument Absolute Neutrophil Count 12.10 x10 3/uL      Neutrophils 76.2 %      Lymphocytes Automated 15.7 %      Monocytes 6.7 %      Eosinophils Automated 0.2 %      Basophils Automated 0.3 %      Immature Granulocytes 0.9 %      Nucleated RBC 0.0 /100 WBC      Neutrophils Absolute 12.10 x10 3/uL      Lymphocytes Absolute Automated 2.49 x10 3/uL      Monocytes Absolute Automated 1.07 x10 3/uL      Eosinophils Absolute Automated 0.03 x10 3/uL      Basophils Absolute Automated 0.04 x10 3/uL      Immature Granulocytes Absolute 0.15 x10 3/uL      Absolute NRBC 0.00 x10 3/uL     Glucose Whole Blood - POCT [[id number]]  (Abnormal) Collected: 07/08/22 2346     Updated: 07/08/22 2349     Whole Blood Glucose POCT 321 mg/dL     Glucose Whole Blood - POCT [[id number]]  (Abnormal) Collected: 07/08/22 2029     Updated: 07/08/22 2031  Whole Blood Glucose POCT 294 mg/dL           Radiology reports have been reviewed:  Radiology Results (24 Hour)       ** No results found for the last 24 hours. **          MRI Orbit Face Neck W WO Contrast    Result  Date: 07/08/2022  HISTORY: Right-sided sinonasal disease COMPARISON: CT sinus 07/07/2022 TECHNIQUE: MRI of the face performed on a 3.0 Tesla scanner without and with intravenous contrast. CONTRAST: gadoterate meglumine (CLARISCAN) 10 MMOL/20ML injection 28 mL FINDINGS: The right sphenoid sinus is completely opacified by mildly intrinsically T1 hyperintense, T2 hypointense, nonenhancing material. This may represent a mucocele versus chronically inspissated secretions. There is no underlying enhancing mass lesion. No intracranial or sellar extension. There is mild scattered benign appearing mucosal thickening elsewhere. The visualized portions of the brain and orbits are unremarkable.     Complete opacification of the right sphenoid sinus with proteinaceous nonenhancing contents. Findings may represent mucocele versus chronically inspissated secretions. There is no enhancing mass lesion. No intracranial extension of disease. Brenton Grills, MD 07/08/2022 8:03 AM    CT Sinus Facial Bones with Contrast    Result Date: 07/07/2022  HISTORY: Sinusitis evaluation COMPARISON: A head CT from the same day, dictated separately. TECHNIQUE: CT of the sinuses performed with intravenous contrast. Multiplanar reformatted images were created and reviewed. The following dose reduction techniques were utilized: automated exposure control and/or adjustment of the mA and/or KV according to patient size, and the use of an iterative reconstruction technique. CONTRAST: iohexol (OMNIPAQUE) 350 MG/ML injection 100 mL FINDINGS: There is severe opacification of the mid and posterior right ethmoid air cells and right sphenoid sinus with occlusion of the right sphenoethmoidal recess. There is involvement of the right nasal cavity most pronounced posteriorly. There is increased density within the secretions. There is bowing of the right lamina papyracea without obvious orbital extension of disease. There is mild mucosal thickening of the left  ethmoid air cells and left sphenoid sinus with occlusion of the left sphenoethmoidal recess. There is mild mucosal thickening of the maxillary sinuses with a potential retention cyst in the right maxillary sinus. There is occlusion of the infundibula. There are secretions in the upper left nasal cavity. There is leftward nasal septal deviation. The retromaxillary fat planes and pterygopalatine fissures are intact. The adenoids and palatine tonsils are prominent. There is no abnormal enhancement within the soft tissues. Limited imaging of the brain is unremarkable. The mastoid air cells and middle ear cavities are clear. There is an impacted tooth with surrounding lucency situated between the right maxillary cuspid and first bicuspid teeth.     1.There is severe mid-posterior right ethmoid air cell and right sphenoid sinus opacification. There is bowing of the right lamina papyracea. There is involvement of the right nasal cavity. There is increased attenuation within the secretions. Diagnostic possibilities include but are not limited to a mucocele and underlying paranasal sinus disease, severe paranasal sinus disease with inspissated secretions, fungal disease or polyposis. 2.Mild mucosal thickening within the left ethmoid air cells, left sphenoid sinus and both maxillary sinuses. 3.Prominent adenoids and palatine tonsils. Correlation with the patient's immune status may be of benefit. There is an impacted tooth with surrounding lucency situated between the right maxillary cuspid and first bicuspid teeth. Otho Ket, DO 07/07/2022 3:10 PM    CT Head WO Contrast    Result Date: 07/07/2022  HISTORY: persistent headaches COMPARISON: CT head 07/05/2022  TECHNIQUE: CT of the head performed without intravenous contrast. The following dose reduction techniques were utilized: automated exposure control and/or adjustment of the mA and/or KV according to patient size, and the use of an iterative reconstruction technique.  CONTRAST: None. FINDINGS: Cerebral ventricles, cortical sulci, basilar cisterns are within normal limits. Gray-white differentiation is within normal limits. No acute intracranial hemorrhage or mass effect identified. Calvarium appears intact. There is again prominent right sphenoid sinus and posterior right ethmoid air cell disease mildly expanded contours. High attenuation material within these sinuses is likely on the basis of inspissated secretions.      1.  No acute intracranial hemorrhage, mass effect, or hydrocephalus. 2.  Prominent right sphenoid sinus and posterior right ethmoid air cell disease, 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 Melody Haver, MD 07/07/2022 11:51 AM    CT Angiogram Head    Result Date: 07/05/2022  HISTORY: Headache x3 weeks. Reportedly 10 out of 10. No improvement despite treatment. NML neuro exam. No prior hx of aneurysm. COMPARISON: None available. Correlation made with unenhanced CT of the head, performed same day reported separately. TECHNIQUE: CT angiogram of the head performed with intravenous contrast. Multiplanar reformatted and 3D maximum intensity projection (MIP) images were created and reviewed. The following dose reduction techniques were utilized: automated exposure control and/or adjustment of the mA and/or KV according to patient size, and the use of an iterative reconstruction technique. CONTRAST: iohexol (OMNIPAQUE) 350 MG/ML injection 80 mL FINDINGS: The intracranial right internal carotid artery is patent.  The intracranial left internal carotid artery is patent.  Both the intracranial vertebral arteries are patent.  The basilar artery is normal in caliber.  The proximal visualized portions of the anterior cerebral, middle cerebral and posterior cerebral arteries are patent. No aneurysm or AVM is demonstrated.     1.Wide patency of the intracranial arterial circulation. 2.No aneurysm or AVM detected. Waynard Edwards, MD 07/05/2022 6:36 PM    CT Head without Contrast    Result  Date: 07/05/2022  HISTORY: Persistent headaches for 3 weeks. COMPARISON: Comparison exam dated 07/01/2022. Correlation made with intracranial CT angiography, performed same day reported separately. TECHNIQUE: CT of the head performed without intravenous contrast. The following dose reduction techniques were utilized: automated exposure control and/or adjustment of the mA and/or KV according to patient size, and the use of an iterative reconstruction technique. CONTRAST: None. FINDINGS: The ventricles are normal in configuration. No intracranial hemorrhage, mass-effect or midline shift is present. No extra-axial fluid collections are seen. There is no CT evidence of an acute cortical infarct. The parenchyma is unremarkable in attenuation. The calvarium is intact. Redemonstrated is complete opacification of the posterior right-sided ethmoid air cells with near complete opacification of the right sphenoid sinus by membrane thickening and fluid which exhibits heterogeneous hyperattenuation. This is nonspecific and can be seen in the setting of inspissated, protein-rich secretions and/or superimposed fungal infection. This irregular soft tissue extends into the posterior-superior aspect of the right nasal cavity near the nasal choana. There is no abnormal distention or hyperattenuation of either cavernous sinus. The balance of the visualized paranasal sinuses, middle ear cavities and mastoid air cells are well-pneumatized and clear. The orbital contents are normal. Adenoidal hypertrophy is present.      1.No CT evidence of acute intracranial abnormality. 2.Severe right sphenoid and posterior ethmoid sinusitis for which otolaryngology follow-up consultation and evaluation is again advised. An obstructive lesion at the right sphenoethmoidal recess is not excluded and endoscopic or surgical evaluation should be considered. Waynard Edwards,  MD 07/05/2022 6:32 PM    CT Head WO Contrast    Result Date: 07/01/2022  HISTORY:  Headache, chronic, new features or increased frequency COMPARISON: None available. TECHNIQUE: CT of the head performed without intravenous contrast. The following dose reduction techniques were utilized: automated exposure control and/or adjustment of the mA and/or KV according to patient size, and the use of an iterative reconstruction technique. CONTRAST: None. FINDINGS: The ventricles are normal in configuration. No intracranial hemorrhage, mass-effect or midline shift is present. No extra-axial fluid collections are seen. There is no CT evidence of an acute cortical infarct. The parenchyma is unremarkable in attenuation. The calvarium is intact. There is complete opacification of the posterior right-sided ethmoid air cells with near complete opacification of the right sphenoid sinus by membrane thickening and fluid which exhibits heterogeneous hyperattenuation. This is nonspecific and can be seen in the setting of inspissated, protein-rich secretions and/or superimposed fungal infection. This irregular soft tissue extends into the posterior-superior aspect of the right nasal cavity near the nasal choana. The balance of the visualized paranasal sinuses, middle ear cavities and mastoid air cells are well-pneumatized and clear. The orbital contents are normal. Adenoidal hypertrophy is present.      1.No CT evidence of acute intracranial abnormality. 2.Right sphenoid and posterior ethmoid sinusitis for which otolaryngology follow-up consultation is advised. An obstructive lesion at the right sphenoethmoidal recess is not excluded. Waynard Edwards, MD 07/01/2022 9:04 PM    Pathology:   Specimens (From admission, onward)      None          Last EKG Result       None          Hospitalist   Signed by:   Bertrum Sol, MD  07/09/2022 5:30 PM  Time spent in evaluating and managing the care of the patient including face-to-face and non-face-to-face: 50 minutes    *This note was generated by the Epic EMR system/ Dragon  speech recognition and may contain inherent errors or omissions not intended by the user. Grammatical errors, random word insertions, deletions, pronoun errors and incomplete sentences are occasional consequences of this technology due to software limitations. Not all errors are caught or corrected. If there are questions or concerns about the content of this note or information contained within the body of this dictation they should be addressed directly with the author for clarification

## 2022-07-09 NOTE — Plan of Care (Signed)
Problem: Pain interferes with ability to perform ADL  Goal: Pain at adequate level as identified by patient  Outcome: Progressing     Problem: Side Effects from Pain Analgesia  Goal: Patient will experience minimal side effects of analgesic therapy  Outcome: Progressing     Problem: Safety  Goal: Patient will be free from injury during hospitalization  Outcome: Progressing  Goal: Patient will be free from infection during hospitalization  Outcome: Progressing     Problem: Pain  Goal: Pain at adequate level as identified by patient  Outcome: Progressing     Problem: Psychosocial and Spiritual Needs  Goal: Demonstrates ability to cope with hospitalization/illness  Outcome: Progressing

## 2022-07-09 NOTE — UM Notes (Signed)
Reference/Auth #: [id number] IP   Review for DOS: 07/09/22 IP    [name],[name] and 2000/10/04    Direct # call back to Oluwaseun Bruyere : [phone]  [facility] Utilization Review   NPI #: [id number]   Tax ID#  [id number]  Department Phone#: [phone]  Email: [email or web address]  Please fax final authorization and requests for additional information to [phone]      ==========[facility] MEDICINE 07/09/22 11:51 AM  =============================    DIAGNOSIS  Acute intractable headache, unspecified headache type -- --   Acute sphenoidal sinusitis, recurrence not specified -- --   Acute ethmoidal sinusitis, recurrence not specified       Vitals:   Temp:  [97.5 F (36.4 C)-98.4 F (36.9 C)]   Heart Rate:  [40-64]   Resp Rate:  [16-18]   BP: (145-157)/(74-84)   SpO2:  [95 %-98 %]   Oxygen:RA  Pain: 4/10    Abnormal Labs:    WBC 15  GLUCOSE 350    Continue Plan of Care:  MEDICINE   Ethmoid sinusitis with complete opacification  MRI face: Complete opacification of the right sphenoid sinus with proteinaceous nonenhancing contents  Started Unasyn 3 g IV every 6  Change dexamethasone to prednisone taper  ENT consulted who might take patient to the OR  2  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  Normally takes Lantus 10 units twice a day without any short acting, but after starting steroids increased to 3 times a day  Place on Lantus 20 units twice a day  Start Humalog 5 units 3 times daily on top of medium sliding scale     Intractable headache secondary to sinusitis  Continue Fioricet as needed we will also add Toradol as needed    Scheduled Meds:  Current Facility-Administered Medications   Medication Dose Route Frequency    ampicillin-sulbactam  3 g Intravenous Q6H    enoxaparin  40 mg Subcutaneous Daily    fluticasone  1 spray Each Nare Daily    insulin glargine  20 Units Subcutaneous Q12H    insulin lispro  1-4 Units Subcutaneous QHS    insulin lispro  1-8 Units Subcutaneous TID AC    insulin lispro  5 Units  Subcutaneous TID AC    ketorolac  30 mg Intravenous 4 times per day    lactobacillus/streptococcus  1 capsule Oral Daily    oxymetazoline  2 spray Each Nare Once    predniSONE  20 mg Oral Daily    Followed by    Melene Muller ON 07/11/2022] predniSONE  15 mg Oral Daily    Followed by    Melene Muller ON 07/14/2022] predniSONE  10 mg Oral Daily    Followed by    Melene Muller ON 07/17/2022] predniSONE  5 mg Oral Daily    saline  2 spray Each Nare TID     Continuous Infusions:

## 2022-07-09 NOTE — Progress Notes (Signed)
Patient's feels some improvement.  He still has headaches and light sensitivity.  He prefers a dark room.  Denies neck stiffness.  He has had no fevers.    PE:  Vitals:    07/09/22 1618   BP: 134/78   Pulse: 61   Resp: 16   Temp: 97.7 F (36.5 C)   SpO2: 96%     No proptosis  No epistaxis  No facial paralysis/facial nerve intact and symmetric  No otorrhea  Oral cavity moist mucosa  Neck supple full range of motion  Lymphatics: No evidence of cervical lymphadenitis  AAOx3  Normal mood and affect  Motor strength 5 out of 5 all 4 extremities      CT maxillofacial with contrast and MRI maxillofacial with and without contrast were reviewed.    CSF cultures thus far negative    Assessment: 21 year old with diabetes and intractable headache with nausea and light sensitivity with evidence of a large expansile lesion within the right sphenoid sinus extending to the ethmoid cavity.  An MRI and CT were performed.  Plan:  Would also treat for migraine in addition to steroids and antibiotics for a sinus process.  Plan for or on Wednesday to assess the right-sided sinonasal disease pathology.  Patient is being weaned off of steroids.  He is on antibiotics.  Added Flonase earlier today.  Saline spray to be continued.  Afrin maximum 3 to 5 days.  Plan was discussed with hospitalist and patient.

## 2022-07-09 NOTE — Progress Notes (Addendum)
Toradol q6h scheduled added to pt pain regimen. Patient states that his headache has been a little better with the scheduled toradol. 1 dose of fioricet administered at 2026 on 11/19. Patient lost IV access on RAC throughout the night. New IV placed this morning in his left forearm. Patient sugars have been high. Last BG check was before midnight at 306. Lantus administered. Patient denies any further needs at this moment. Call bell within reach.

## 2022-07-10 LAB — CBC AND DIFFERENTIAL
Absolute NRBC: 0 10*3/uL (ref 0.00–0.00)
Basophils Absolute Automated: 0.03 10*3/uL (ref 0.00–0.08)
Basophils Automated: 0.2 %
Eosinophils Absolute Automated: 0.25 10*3/uL (ref 0.00–0.44)
Eosinophils Automated: 2 %
Hematocrit: 45.4 % (ref 37.6–49.6)
Hgb: 15.4 g/dL (ref 12.5–17.1)
Immature Granulocytes Absolute: 0.1 10*3/uL — ABNORMAL HIGH (ref 0.00–0.07)
Immature Granulocytes: 0.8 %
Instrument Absolute Neutrophil Count: 7.21 10*3/uL — ABNORMAL HIGH (ref 1.10–6.33)
Lymphocytes Absolute Automated: 4.27 10*3/uL — ABNORMAL HIGH (ref 0.42–3.22)
Lymphocytes Automated: 33.5 %
MCH: 27.6 pg (ref 25.1–33.5)
MCHC: 33.9 g/dL (ref 31.5–35.8)
MCV: 81.4 fL (ref 78.0–96.0)
MPV: 10.4 fL (ref 8.9–12.5)
Monocytes Absolute Automated: 0.88 10*3/uL — ABNORMAL HIGH (ref 0.21–0.85)
Monocytes: 6.9 %
Neutrophils Absolute: 7.21 10*3/uL — ABNORMAL HIGH (ref 1.10–6.33)
Neutrophils: 56.6 %
Nucleated RBC: 0 /100 WBC (ref 0.0–0.0)
Platelets: 240 10*3/uL (ref 142–346)
RBC: 5.58 10*6/uL (ref 4.20–5.90)
RDW: 12 % (ref 11–15)
WBC: 12.74 10*3/uL — ABNORMAL HIGH (ref 3.10–9.50)

## 2022-07-10 LAB — GLUCOSE WHOLE BLOOD - POCT
Whole Blood Glucose POCT: 136 mg/dL — ABNORMAL HIGH (ref 70–100)
Whole Blood Glucose POCT: 237 mg/dL — ABNORMAL HIGH (ref 70–100)
Whole Blood Glucose POCT: 261 mg/dL — ABNORMAL HIGH (ref 70–100)
Whole Blood Glucose POCT: 316 mg/dL — ABNORMAL HIGH (ref 70–100)
Whole Blood Glucose POCT: 333 mg/dL — ABNORMAL HIGH (ref 70–100)

## 2022-07-10 MED ORDER — HYDRALAZINE HCL 20 MG/ML IJ SOLN
5.0000 mg | Freq: Once | INTRAMUSCULAR | Status: AC
Start: 2022-07-10 — End: 2022-07-10
  Administered 2022-07-10: 5 mg via INTRAVENOUS

## 2022-07-10 MED ORDER — PREDNISONE 5 MG PO TABS
5.0000 mg | ORAL_TABLET | Freq: Every morning | ORAL | Status: DC
Start: 2022-07-11 — End: 2022-07-13
  Administered 2022-07-11 – 2022-07-13 (×3): 5 mg via ORAL

## 2022-07-10 NOTE — Progress Notes (Addendum)
Patient had a fairly good shift. No complaints of headaches. No pain medication administered for the night. IV atb administered as ordered. Patient blood pressure elevated over night and with low HR down to low 40s. 1 dose of hydralazine administered. Last bp reading at 146/84. Patient still bradycardic but denies any symptoms.Patient is resting comfortably in bed, denies any further needs. Call bell within reach.

## 2022-07-10 NOTE — UM Notes (Signed)
CSR, 11/21:      PATIENT NAME: [name],[name]   DOB: 18-Apr-2001     Reason for Continued Stay:  21 Year old male admitted for intractable headache secondary to complete opacification of ethmoid sinuses admitted to sinusitis    Vital Signs:  Vitals:    07/10/22 0006 07/10/22 0511 07/10/22 0803 07/10/22 1154   BP: (!) 171/96 146/84 158/80 147/80   Pulse: (!) 41 (!) 51 (!) 46 (!) 57   Resp: 16 16 16 16    Temp: 97.5 F (36.4 C) 97.5 F (36.4 C) 98.6 F (37 C) 98.4 F (36.9 C)   TempSrc: Oral Oral Oral Oral   SpO2: 97% 97% 99% 96%   Weight:       Height:           Intake/Output Summary (Last 24 hours) at 07/10/2022 1519  Last data filed at 07/10/2022 1030  Gross per 24 hour   Intake 240 ml   Output --   Net 240 ml     Labs:   Latest Reference Range & Units 07/10/22 07:38   WBC 3.10 - 9.50 x10 3/uL 12.74 (H)   Hemoglobin 12.5 - 17.1 g/dL 16.1   Hematocrit 09.6 - 49.6 % 45.4   Platelet Count 142 - 346 x10 3/uL 240   RBC 4.20 - 5.90 x10 6/uL 5.58   MCV 78.0 - 96.0 fL 81.4   MCH 25.1 - 33.5 pg 27.6   MCHC 31.5 - 35.8 g/dL 04.5   RDW 11 - 15 % 12   MPV 8.9 - 12.5 fL 10.4   Instrument Absolute Neutrophil Count 1.10 - 6.33 x10 3/uL 7.21 (H)   Neutrophils None % 56.6   Lymphocytes Automated None % 33.5   Lymphocytes Absolute Automated 0.42 - 3.22 x10 3/uL 4.27 (H)   Monocytes None % 6.9   Monocytes Absolute Automated 0.21 - 0.85 x10 3/uL 0.88 (H)   Eosinophils Automated None % 2.0   Eosinophils Absolute Automated 0.00 - 0.44 x10 3/uL 0.25   Basophils Automated None % 0.2   Basophils Absolute Automated 0.00 - 0.08 x10 3/uL 0.03   Immature Granulocytes None % 0.8   Immature Granulocytes Absolute 0.00 - 0.07 x10 3/uL 0.10 (H)   Nucleated RBC 0.0 - 0.0 /100 WBC 0.0   Neutrophils Absolute 1.10 - 6.33 x10 3/uL 7.21 (H)   Nucleated RBC Absolute 0.00 - 0.00 x10 3/uL 0.00   (H): Data is abnormally high    Plan of Care:  Ethmoid sinusitis with complete opacification   MRI face: Complete opacification of the right sphenoid sinus with  proteinaceous nonenhancing contents   Started Unasyn 3 g IV every 6   Change dexamethasone to prednisone taper   Discussed with ENT in detail about plan of care; contin steroids and iv abx,  might take patient to the OR   to OR on 11/22     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   Normally takes Lantus 10 units twice a day without any short acting, but after starting steroids increased to 3 times a day   Contin Lantus 25 units twice a day   Start Humalog 5 units 3 times daily; increase to high sliding scale   Lower prednisone to 5 mg daily     Intractable headache secondary to sinusitis   Continue Fioricet as needed we will also add Toradol as needed     Disposition:         DVT Prophylaxis:Medication VTE Prophylaxis Orders: enoxaparin (LOVENOX)  syringe 40 mg  Mechanical VTE Prophylaxis Orders: Maintain sequential compression device   Central Line/Foley Catheter/PICC line status: None  Code Status: Full Code  Disposition:Planning to discharge patient to home  Type of Admission:Inpatient        Medications:  Scheduled Meds:  Current Facility-Administered Medications   Medication Dose Route Frequency    ampicillin-sulbactam  3 g Intravenous Q6H    enoxaparin  40 mg Subcutaneous Daily    fluticasone  1 spray Each Nare BID    insulin glargine  25 Units Subcutaneous Q12H SCH    insulin lispro  1-6 Units Subcutaneous QHS    insulin lispro  2-10 Units Subcutaneous TID AC    insulin lispro  5 Units Subcutaneous TID AC    lactobacillus/streptococcus  1 capsule Oral Daily    [START ON 07/11/2022] predniSONE  15 mg Oral Daily    Followed by    Melene Muller ON 07/14/2022] predniSONE  10 mg Oral Daily    Followed by    Melene Muller ON 07/17/2022] predniSONE  5 mg Oral Daily    saline  2 spray Each Nare TID     Continuous Infusions:  PRN Meds:.acetaminophen, benzocaine-menthol, benzonatate, butalbital-acetaminophen-caffeine, artificial tears (REFRESH PLUS), dextrose **OR** dextrose **OR** dextrose **OR** glucagon (rDNA), magnesium  sulfate, melatonin, naloxone, potassium & sodium phosphates, potassium chloride **AND** potassium chloride, saline      PRN:    UTILIZATION REVIEW CONTACT: Gae Gallop RN, BSN  Utilization Review   [facility]  [address]  [address]  [place], [place] [address]  Phone: [phone] (Voice Mail Only)  Email: [email or web address]   NPI:   [id number]  Tax ID:  [id number]         NOTES TO REVIEWER:    This clinical review is based on/compiled from documentation provided by the treatment team within the patient's medical record.

## 2022-07-10 NOTE — Progress Notes (Signed)
[facility]PITAL[facility] Progress Note  Patient Info:   Date/Time: 07/10/2022 / 4:52 PM   Admit Date:07/07/2022  Patient Name:[name]   ZOX:[id number]   PCP: Oneita HurtPcp, None, MD  Attending Physician:Delita Chiquito, Harrell GaveBethel, MD  Assessment/Plan:     21 year old with history of diabetes comes in with intractable headache secondary to complete opacification of ethmoid sinuses admitted to sinusitis    Ethmoid sinusitis with complete opacification  MRI face: Complete opacification of the right sphenoid sinus with proteinaceous nonenhancing contents  Started Unasyn 3 g IV every 6  Change dexamethasone to prednisone taper  Discussed with ENT in detail about plan of care; contin steroids and iv abx,  might take patient to the OR  to OR on 11/22    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  Normally takes Lantus 10 units twice a day without any short acting, but after starting steroids increased to 3 times a day  Contin Lantus 25 units twice a day  Start Humalog 5 units 3 times daily; increase to high sliding scale  Lower prednisone to 5 mg daily    Intractable headache secondary to sinusitis  Continue Fioricet as needed we will also add Toradol as needed    Disposition:        DVT Prophylaxis:Medication VTE Prophylaxis Orders: enoxaparin (LOVENOX) syringe 40 mg  Mechanical VTE Prophylaxis Orders: Maintain sequential compression device   Central Line/Foley Catheter/PICC line status: None  Code Status: Full Code  Disposition:Planning to discharge patient to home  Type of Admission:Inpatient  Hospital Problems:     Active Hospital Problems    Diagnosis    Acute intractable headache, unspecified headache type     Subjective:   Chief Complaint:  Headache and Nausea    07/10/22   ROS    No chest pain or difficulty breathing  Has some headache but improved from yesterday  No nausea vomiting  Abdominal pain improved  No fever    Objective:     Vitals:    07/10/22 0511 07/10/22 0803 07/10/22 1154 07/10/22 1556   BP: 146/84 158/80 147/80 135/78    Pulse: (!) 51 (!) 46 (!) 57 62   Resp: 16 16 16 16    Temp: 97.5 F (36.4 C) 98.6 F (37 C) 98.4 F (36.9 C) 97.3 F (36.3 C)   TempSrc: Oral Oral Oral Oral   SpO2: 97% 99% 96% 98%   Weight:       Height:         Physical Exam:   Physical Exam    General no apparent distress  Chest clear to auscultation  Heart regular rate rhythm  Abdomen BS present soft  Extremity no edema  Current Medications      Scheduled Meds: PRN Meds:    ampicillin-sulbactam, 3 g, Intravenous, Q6H  enoxaparin, 40 mg, Subcutaneous, Daily  fluticasone, 1 spray, Each Nare, BID  insulin glargine, 25 Units, Subcutaneous, Q12H SCH  insulin lispro, 1-6 Units, Subcutaneous, QHS  insulin lispro, 2-10 Units, Subcutaneous, TID AC  insulin lispro, 5 Units, Subcutaneous, TID AC  lactobacillus/streptococcus, 1 capsule, Oral, Daily  [START ON 07/11/2022] predniSONE, 5 mg, Oral, QAM W/BREAKFAST  saline, 2 spray, Each Nare, TID        Continuous Infusions:   acetaminophen, 650 mg, Q4H PRN  benzocaine-menthol, 1 lozenge, Q2H PRN  benzonatate, 100 mg, TID PRN  butalbital-acetaminophen-caffeine, 1 tablet, Q4H PRN  artificial tears (REFRESH PLUS), 1 drop, TID PRN  dextrose, 15 g of glucose, PRN   Or  dextrose, 12.5  g, PRN   Or  dextrose, 12.5 g, PRN   Or  glucagon (rDNA), 1 mg, PRN  magnesium sulfate, 1 g, PRN  melatonin, 3 mg, QHS PRN  naloxone, 0.2 mg, PRN  potassium & sodium phosphates, 2 packet, PRN  potassium chloride, 0-40 mEq, PRN   And  potassium chloride, 10 mEq, PRN  saline, 2 spray, Q4H PRN          Results of Labs/imaging   Labs for the last 24 hours have been reviewed:   Coagulation Profile:   Recent Labs   Lab 07/05/22  1802   PT 12.2   PT INR 1.1         CBC review:   Recent Labs   Lab 07/10/22  0738 07/09/22  0640 07/08/22  0647 07/07/22  1129 07/05/22  1802   WBC 12.74* 15.88* 13.57* 13.49* 10.39*   Hgb 15.4 15.3 16.0 15.8 14.6   Hematocrit 45.4 43.7 45.6 45.1 41.2   Platelets 240 251 259 253 217   MCV 81.4 79.9 79.6 79.3 79.1   RDW 12 12 12  13 12    Neutrophils 56.6 76.2 85.0 68.9 77.4   Neutrophils Absolute 7.21* 12.10* 11.53* 9.30* 8.05*   Lymphocytes Automated 33.5 15.7 10.3 22.7 16.2   Eosinophils Automated 2.0 0.2 0.0 0.7 2.1   Immature Granulocytes 0.8 0.9 0.8 0.4 0.3   Immature Granulocytes Absolute 0.10* 0.15* 0.11* 0.05 0.03       Chem Review:  Recent Labs   Lab 07/09/22  0640 07/08/22  0647 07/07/22  1129 07/05/22  1802   Sodium 136 135 135 138   Potassium 4.3 4.4 4.0 4.2   Chloride 104 102 99 104   CO2 26 25 27 26    BUN 22.0 15.0 14.0 13.0   Creatinine 1.3 1.1 1.3 1.2   Glucose 350* 311* 228* 271*   Calcium 9.1 9.4 9.6 9.3   Magnesium  --   --  1.6  --    Bilirubin, Total  --   --  0.6  --    AST (SGOT)  --   --  12  --    ALT  --   --  26  --    Alkaline Phosphatase  --   --  65  --        Results       Procedure Component Value Units Date/Time    Glucose Whole Blood - POCT [[id number]]  (Abnormal) Collected: 07/10/22 1558     Updated: 07/10/22 1611     Whole Blood Glucose POCT 333 mg/dL     Culture + Gram Stain,Aerobic, CSF [[id number]] Collected: 07/07/22 1259    Specimen: Cerebrospinal Fluid from CSF (Lumbar Puncture Spinal Fluid) Updated: 07/10/22 1606    Narrative:      ORDER#: [id number]                                    ORDERED BY: MEHTA, SAMEER  SOURCE: CSF (Lumbar Puncture Spinal Fluid)           COLLECTED:  07/07/22 12:59  ANTIBIOTICS AT COLL.:                                RECEIVED :  07/07/22 18:26  Stain, Gram  FINAL       07/07/22 20:10  07/07/22   Stain performed on Cytospin (concentrated) specimen             Few WBCs             No organisms seen  Culture and Gram Stain, Aerobic, CSF       PRELIM      07/10/22 16:06  07/08/22   Culture no growth to date, Final report to follow  07/09/22   Culture no growth to date, Final report to follow  07/10/22   Culture no growth to date, Final report to follow      Glucose Whole Blood - POCT [[id number]]  (Abnormal) Collected: 07/10/22 1155     Updated:  07/10/22 1235     Whole Blood Glucose POCT 237 mg/dL     Glucose Whole Blood - POCT [[id number]]  (Abnormal) Collected: 07/10/22 0800     Updated: 07/10/22 0813     Whole Blood Glucose POCT 136 mg/dL     CBC and differential [[id number]]  (Abnormal) Collected: 07/10/22 0738    Specimen: Blood Updated: 07/10/22 0810     WBC 12.74 x10 3/uL      Hgb 15.4 g/dL      Hematocrit 29.5 %      Platelets 240 x10 3/uL      RBC 5.58 x10 6/uL      MCV 81.4 fL      MCH 27.6 pg      MCHC 33.9 g/dL      RDW 12 %      MPV 10.4 fL      Instrument Absolute Neutrophil Count 7.21 x10 3/uL      Neutrophils 56.6 %      Lymphocytes Automated 33.5 %      Monocytes 6.9 %      Eosinophils Automated 2.0 %      Basophils Automated 0.2 %      Immature Granulocytes 0.8 %      Nucleated RBC 0.0 /100 WBC      Neutrophils Absolute 7.21 x10 3/uL      Lymphocytes Absolute Automated 4.27 x10 3/uL      Monocytes Absolute Automated 0.88 x10 3/uL      Eosinophils Absolute Automated 0.25 x10 3/uL      Basophils Absolute Automated 0.03 x10 3/uL      Immature Granulocytes Absolute 0.10 x10 3/uL      Absolute NRBC 0.00 x10 3/uL     Glucose Whole Blood - POCT [[id number]]  (Abnormal) Collected: 07/10/22 0007     Updated: 07/10/22 0011     Whole Blood Glucose POCT 261 mg/dL     Glucose Whole Blood - POCT [[id number]]  (Abnormal) Collected: 07/09/22 2208     Updated: 07/09/22 2211     Whole Blood Glucose POCT 251 mg/dL     Glucose Whole Blood - POCT [[id number]]  (Abnormal) Collected: 07/09/22 2027     Updated: 07/09/22 2031     Whole Blood Glucose POCT 197 mg/dL           Radiology reports have been reviewed:  Radiology Results (24 Hour)       ** No results found for the last 24 hours. **          MRI Orbit Face Neck W WO Contrast    Result Date: 07/08/2022  HISTORY: Right-sided sinonasal disease COMPARISON: CT sinus 07/07/2022 TECHNIQUE: MRI of the face performed on a 3.0 Tesla scanner  without and with intravenous contrast. CONTRAST: gadoterate meglumine (CLARISCAN)  10 MMOL/20ML injection 28 mL FINDINGS: The right sphenoid sinus is completely opacified by mildly intrinsically T1 hyperintense, T2 hypointense, nonenhancing material. This may represent a mucocele versus chronically inspissated secretions. There is no underlying enhancing mass lesion. No intracranial or sellar extension. There is mild scattered benign appearing mucosal thickening elsewhere. The visualized portions of the brain and orbits are unremarkable.     Complete opacification of the right sphenoid sinus with proteinaceous nonenhancing contents. Findings may represent mucocele versus chronically inspissated secretions. There is no enhancing mass lesion. No intracranial extension of disease. Brenton Grills, MD 07/08/2022 8:03 AM    CT Sinus Facial Bones with Contrast    Result Date: 07/07/2022  HISTORY: Sinusitis evaluation COMPARISON: A head CT from the same day, dictated separately. TECHNIQUE: CT of the sinuses performed with intravenous contrast. Multiplanar reformatted images were created and reviewed. The following dose reduction techniques were utilized: automated exposure control and/or adjustment of the mA and/or KV according to patient size, and the use of an iterative reconstruction technique. CONTRAST: iohexol (OMNIPAQUE) 350 MG/ML injection 100 mL FINDINGS: There is severe opacification of the mid and posterior right ethmoid air cells and right sphenoid sinus with occlusion of the right sphenoethmoidal recess. There is involvement of the right nasal cavity most pronounced posteriorly. There is increased density within the secretions. There is bowing of the right lamina papyracea without obvious orbital extension of disease. There is mild mucosal thickening of the left ethmoid air cells and left sphenoid sinus with occlusion of the left sphenoethmoidal recess. There is mild mucosal thickening of the maxillary sinuses with a potential retention cyst in the right maxillary sinus. There is occlusion of  the infundibula. There are secretions in the upper left nasal cavity. There is leftward nasal septal deviation. The retromaxillary fat planes and pterygopalatine fissures are intact. The adenoids and palatine tonsils are prominent. There is no abnormal enhancement within the soft tissues. Limited imaging of the brain is unremarkable. The mastoid air cells and middle ear cavities are clear. There is an impacted tooth with surrounding lucency situated between the right maxillary cuspid and first bicuspid teeth.     1.There is severe mid-posterior right ethmoid air cell and right sphenoid sinus opacification. There is bowing of the right lamina papyracea. There is involvement of the right nasal cavity. There is increased attenuation within the secretions. Diagnostic possibilities include but are not limited to a mucocele and underlying paranasal sinus disease, severe paranasal sinus disease with inspissated secretions, fungal disease or polyposis. 2.Mild mucosal thickening within the left ethmoid air cells, left sphenoid sinus and both maxillary sinuses. 3.Prominent adenoids and palatine tonsils. Correlation with the patient's immune status may be of benefit. There is an impacted tooth with surrounding lucency situated between the right maxillary cuspid and first bicuspid teeth. Otho Ket, DO 07/07/2022 3:10 PM    CT Head WO Contrast    Result Date: 07/07/2022  HISTORY: persistent headaches COMPARISON: CT head 07/05/2022 TECHNIQUE: CT of the head performed without intravenous contrast. The following dose reduction techniques were utilized: automated exposure control and/or adjustment of the mA and/or KV according to patient size, and the use of an iterative reconstruction technique. CONTRAST: None. FINDINGS: Cerebral ventricles, cortical sulci, basilar cisterns are within normal limits. Gray-white differentiation is within normal limits. No acute intracranial hemorrhage or mass effect identified. Calvarium  appears intact. There is again prominent right sphenoid sinus and posterior right ethmoid air cell disease mildly  expanded contours. High attenuation material within these sinuses is likely on the basis of inspissated secretions.      1.  No acute intracranial hemorrhage, mass effect, or hydrocephalus. 2.  Prominent right sphenoid sinus and posterior right ethmoid air cell disease, 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 Melody Haver, MD 07/07/2022 11:51 AM    CT Angiogram Head    Result Date: 07/05/2022  HISTORY: Headache x3 weeks. Reportedly 10 out of 10. No improvement despite treatment. NML neuro exam. No prior hx of aneurysm. COMPARISON: None available. Correlation made with unenhanced CT of the head, performed same day reported separately. TECHNIQUE: CT angiogram of the head performed with intravenous contrast. Multiplanar reformatted and 3D maximum intensity projection (MIP) images were created and reviewed. The following dose reduction techniques were utilized: automated exposure control and/or adjustment of the mA and/or KV according to patient size, and the use of an iterative reconstruction technique. CONTRAST: iohexol (OMNIPAQUE) 350 MG/ML injection 80 mL FINDINGS: The intracranial right internal carotid artery is patent.  The intracranial left internal carotid artery is patent.  Both the intracranial vertebral arteries are patent.  The basilar artery is normal in caliber.  The proximal visualized portions of the anterior cerebral, middle cerebral and posterior cerebral arteries are patent. No aneurysm or AVM is demonstrated.     1.Wide patency of the intracranial arterial circulation. 2.No aneurysm or AVM detected. Waynard Edwards, MD 07/05/2022 6:36 PM    CT Head without Contrast    Result Date: 07/05/2022  HISTORY: Persistent headaches for 3 weeks. COMPARISON: Comparison exam dated 07/01/2022. Correlation made with intracranial CT angiography, performed same day reported separately. TECHNIQUE: CT of the head  performed without intravenous contrast. The following dose reduction techniques were utilized: automated exposure control and/or adjustment of the mA and/or KV according to patient size, and the use of an iterative reconstruction technique. CONTRAST: None. FINDINGS: The ventricles are normal in configuration. No intracranial hemorrhage, mass-effect or midline shift is present. No extra-axial fluid collections are seen. There is no CT evidence of an acute cortical infarct. The parenchyma is unremarkable in attenuation. The calvarium is intact. Redemonstrated is complete opacification of the posterior right-sided ethmoid air cells with near complete opacification of the right sphenoid sinus by membrane thickening and fluid which exhibits heterogeneous hyperattenuation. This is nonspecific and can be seen in the setting of inspissated, protein-rich secretions and/or superimposed fungal infection. This irregular soft tissue extends into the posterior-superior aspect of the right nasal cavity near the nasal choana. There is no abnormal distention or hyperattenuation of either cavernous sinus. The balance of the visualized paranasal sinuses, middle ear cavities and mastoid air cells are well-pneumatized and clear. The orbital contents are normal. Adenoidal hypertrophy is present.      1.No CT evidence of acute intracranial abnormality. 2.Severe right sphenoid and posterior ethmoid sinusitis for which otolaryngology follow-up consultation and evaluation is again advised. An obstructive lesion at the right sphenoethmoidal recess is not excluded and endoscopic or surgical evaluation should be considered. Waynard Edwards, MD 07/05/2022 6:32 PM    CT Head WO Contrast    Result Date: 07/01/2022  HISTORY: Headache, chronic, new features or increased frequency COMPARISON: None available. TECHNIQUE: CT of the head performed without intravenous contrast. The following dose reduction techniques were utilized: automated exposure  control and/or adjustment of the mA and/or KV according to patient size, and the use of an iterative reconstruction technique. CONTRAST: None. FINDINGS: The ventricles are normal in configuration. No intracranial hemorrhage, mass-effect or midline shift  is present. No extra-axial fluid collections are seen. There is no CT evidence of an acute cortical infarct. The parenchyma is unremarkable in attenuation. The calvarium is intact. There is complete opacification of the posterior right-sided ethmoid air cells with near complete opacification of the right sphenoid sinus by membrane thickening and fluid which exhibits heterogeneous hyperattenuation. This is nonspecific and can be seen in the setting of inspissated, protein-rich secretions and/or superimposed fungal infection. This irregular soft tissue extends into the posterior-superior aspect of the right nasal cavity near the nasal choana. The balance of the visualized paranasal sinuses, middle ear cavities and mastoid air cells are well-pneumatized and clear. The orbital contents are normal. Adenoidal hypertrophy is present.      1.No CT evidence of acute intracranial abnormality. 2.Right sphenoid and posterior ethmoid sinusitis for which otolaryngology follow-up consultation is advised. An obstructive lesion at the right sphenoethmoidal recess is not excluded. Waynard Edwards, MD 07/01/2022 9:04 PM    Pathology:   Specimens (From admission, onward)      None          Last EKG Result       None          Hospitalist   Signed by:   Bertrum Sol, MD  07/10/2022 4:52 PM  Time spent in evaluating and managing the care of the patient including face-to-face and non-face-to-face: 50 minutes    *This note was generated by the [employer] EMR system/ Dragon speech recognition and may contain inherent errors or omissions not intended by the user. Grammatical errors, random word insertions, deletions, pronoun errors and incomplete sentences are occasional consequences of this  technology due to software limitations. Not all errors are caught or corrected. If there are questions or concerns about the content of this note or information contained within the body of this dictation they should be addressed directly with the author for clarification

## 2022-07-10 NOTE — Plan of Care (Signed)
Problem: Pain interferes with ability to perform ADL  Goal: Pain at adequate level as identified by patient  Outcome: Progressing     Problem: Side Effects from Pain Analgesia  Goal: Patient will experience minimal side effects of analgesic therapy  Outcome: Progressing     Problem: Safety  Goal: Patient will be free from injury during hospitalization  Outcome: Progressing  Goal: Patient will be free from infection during hospitalization  Outcome: Progressing     Problem: Pain  Goal: Pain at adequate level as identified by patient  Outcome: Progressing     Problem: Psychosocial and Spiritual Needs  Goal: Demonstrates ability to cope with hospitalization/illness  Outcome: Progressing

## 2022-07-10 NOTE — Plan of Care (Addendum)
Problem: Pain interferes with ability to perform ADL  Goal: Pain at adequate level as identified by patient  Flowsheets (Taken 07/10/2022 0135)  Pain at adequate level as identified by patient:   Identify patient comfort function goal   Assess for risk of opioid induced respiratory depression, including snoring/sleep apnea. Alert healthcare team of risk factors identified.   Assess pain on admission, during daily assessment and/or before any "as needed" intervention(s)   Reassess pain within 30-60 minutes of any procedure/intervention, per Pain Assessment, Intervention, Reassessment (AIR) Cycle   Evaluate if patient comfort function goal is met   Offer non-pharmacological pain management interventions   Evaluate patient's satisfaction with pain management progress   Consult/collaborate with Pain Service     Problem: Safety  Goal: Patient will be free from injury during hospitalization  Flowsheets (Taken 07/10/2022 0135)  Patient will be free from injury during hospitalization:   Assess patient's risk for falls and implement fall prevention plan of care per policy   Provide and maintain safe environment   Ensure appropriate safety devices are available at the bedside   Hourly rounding   Include patient/ family/ care giver in decisions related to safety   Use appropriate transfer methods  Goal: Patient will be free from infection during hospitalization  Flowsheets (Taken 07/10/2022 0135)  Free from Infection during hospitalization:   Assess and monitor for signs and symptoms of infection   Monitor lab/diagnostic results   Monitor all insertion sites (i.e. indwelling lines, tubes, urinary catheters, and drains)   Encourage patient and family to use good hand hygiene technique     Problem: Pain  Goal: Pain at adequate level as identified by patient  Flowsheets (Taken 07/10/2022 0135)  Pain at adequate level as identified by patient:   Identify patient comfort function goal   Assess for risk of opioid induced  respiratory depression, including snoring/sleep apnea. Alert healthcare team of risk factors identified.   Assess pain on admission, during daily assessment and/or before any "as needed" intervention(s)   Reassess pain within 30-60 minutes of any procedure/intervention, per Pain Assessment, Intervention, Reassessment (AIR) Cycle   Evaluate if patient comfort function goal is met   Offer non-pharmacological pain management interventions   Evaluate patient's satisfaction with pain management progress   Consult/collaborate with Pain Service

## 2022-07-10 NOTE — Plan of Care (Signed)
Problem: Pain interferes with ability to perform ADL  Goal: Pain at adequate level as identified by patient  Outcome: Progressing  Flowsheets (Taken 07/10/2022 0135 by Dalene Seltzer, RN)  Pain at adequate level as identified by patient:   Identify patient comfort function goal   Assess for risk of opioid induced respiratory depression, including snoring/sleep apnea. Alert healthcare team of risk factors identified.   Assess pain on admission, during daily assessment and/or before any "as needed" intervention(s)   Reassess pain within 30-60 minutes of any procedure/intervention, per Pain Assessment, Intervention, Reassessment (AIR) Cycle   Evaluate if patient comfort function goal is met   Offer non-pharmacological pain management interventions   Evaluate patient's satisfaction with pain management progress   Consult/collaborate with Pain Service     Problem: Side Effects from Pain Analgesia  Goal: Patient will experience minimal side effects of analgesic therapy  Outcome: Progressing  Flowsheets (Taken 07/08/2022 2253 by Dalene Seltzer, RN)  Patient will experience minimal side effects of analgesic therapy:   Monitor/assess patient's respiratory status (RR depth, effort, breath sounds)   Prevent/manage side effects per LIP orders (i.e. nausea, vomiting, pruritus, constipation, urinary retention, etc.)   Evaluate for opioid-induced sedation with appropriate assessment tool (i.e. POSS)   Assess for changes in cognitive function     Problem: Safety  Goal: Patient will be free from injury during hospitalization  Outcome: Progressing  Flowsheets (Taken 07/10/2022 0135 by Dalene Seltzer, RN)  Patient will be free from injury during hospitalization:   Assess patient's risk for falls and implement fall prevention plan of care per policy   Provide and maintain safe environment   Ensure appropriate safety devices are available at the bedside   Hourly rounding   Include patient/ family/ care giver in decisions related to  safety   Use appropriate transfer methods  Goal: Patient will be free from infection during hospitalization  Outcome: Progressing  Flowsheets (Taken 07/10/2022 0135 by Dalene Seltzer, RN)  Free from Infection during hospitalization:   Assess and monitor for signs and symptoms of infection   Monitor lab/diagnostic results   Monitor all insertion sites (i.e. indwelling lines, tubes, urinary catheters, and drains)   Encourage patient and family to use good hand hygiene technique     Problem: Pain  Goal: Pain at adequate level as identified by patient  Outcome: Progressing  Flowsheets (Taken 07/10/2022 0135 by Dalene Seltzer, RN)  Pain at adequate level as identified by patient:   Identify patient comfort function goal   Assess for risk of opioid induced respiratory depression, including snoring/sleep apnea. Alert healthcare team of risk factors identified.   Assess pain on admission, during daily assessment and/or before any "as needed" intervention(s)   Reassess pain within 30-60 minutes of any procedure/intervention, per Pain Assessment, Intervention, Reassessment (AIR) Cycle   Evaluate if patient comfort function goal is met   Offer non-pharmacological pain management interventions   Evaluate patient's satisfaction with pain management progress   Consult/collaborate with Pain Service     Problem: Psychosocial and Spiritual Needs  Goal: Demonstrates ability to cope with hospitalization/illness  Outcome: Progressing  Flowsheets (Taken 07/08/2022 0127 by Georgeanna Harrison, RN)  Demonstrates ability to cope with hospitalizations/illness:   Encourage patient to set small goals for self   Assist patient to identify own strengths and abilities   Provide quiet environment   Encourage verbalization of feelings/concerns/expectations

## 2022-07-11 ENCOUNTER — Encounter
Admission: EM | Disposition: A | Discharge status: Home / Self Care | Payer: Self-pay | Source: Home / Self Care | Attending: Internal Medicine

## 2022-07-11 ENCOUNTER — Ambulatory Visit: Payer: Self-pay

## 2022-07-11 ENCOUNTER — Encounter: Payer: Self-pay | Admitting: Internal Medicine

## 2022-07-11 ENCOUNTER — Inpatient Hospital Stay: Payer: BC Managed Care – PPO | Admitting: Certified Registered"

## 2022-07-11 HISTORY — PX: ENDOSCOPIC SINUS SURGERY (FESS): SHX3848

## 2022-07-11 LAB — CBC AND DIFFERENTIAL
Absolute NRBC: 0 10*3/uL (ref 0.00–0.00)
Basophils Absolute Automated: 0.05 10*3/uL (ref 0.00–0.08)
Basophils Automated: 0.4 %
Eosinophils Absolute Automated: 0.36 10*3/uL (ref 0.00–0.44)
Eosinophils Automated: 2.6 %
Hematocrit: 43.7 % (ref 37.6–49.6)
Hgb: 15.4 g/dL (ref 12.5–17.1)
Immature Granulocytes Absolute: 0.12 10*3/uL — ABNORMAL HIGH (ref 0.00–0.07)
Immature Granulocytes: 0.9 %
Instrument Absolute Neutrophil Count: 8.41 10*3/uL — ABNORMAL HIGH (ref 1.10–6.33)
Lymphocytes Absolute Automated: 3.89 10*3/uL — ABNORMAL HIGH (ref 0.42–3.22)
Lymphocytes Automated: 28.1 %
MCH: 28.3 pg (ref 25.1–33.5)
MCHC: 35.2 g/dL (ref 31.5–35.8)
MCV: 80.2 fL (ref 78.0–96.0)
MPV: 10.7 fL (ref 8.9–12.5)
Monocytes Absolute Automated: 1.03 10*3/uL — ABNORMAL HIGH (ref 0.21–0.85)
Monocytes: 7.4 %
Neutrophils Absolute: 8.41 10*3/uL — ABNORMAL HIGH (ref 1.10–6.33)
Neutrophils: 60.6 %
Nucleated RBC: 0 /100 WBC (ref 0.0–0.0)
Platelets: 231 10*3/uL (ref 142–346)
RBC: 5.45 10*6/uL (ref 4.20–5.90)
RDW: 12 % (ref 11–15)
WBC: 13.86 10*3/uL — ABNORMAL HIGH (ref 3.10–9.50)

## 2022-07-11 LAB — GLUCOSE WHOLE BLOOD - POCT
Whole Blood Glucose POCT: 193 mg/dL — ABNORMAL HIGH (ref 70–100)
Whole Blood Glucose POCT: 190 mg/dL — ABNORMAL HIGH (ref 70–100)
Whole Blood Glucose POCT: 184 mg/dL — ABNORMAL HIGH (ref 70–100)
Whole Blood Glucose POCT: 247 mg/dL — ABNORMAL HIGH (ref 70–100)
Whole Blood Glucose POCT: 260 mg/dL — ABNORMAL HIGH (ref 70–100)

## 2022-07-11 SURGERY — ENDOSCOPIC SINUS SURGERY (FESS)
Anesthesia: Anesthesia General | Site: Nose | Laterality: Right | Wound class: Clean Contaminated

## 2022-07-11 MED ORDER — OXYMETAZOLINE HCL 0.05 % NA SOLN
NASAL | Status: AC
Start: 2022-07-11 — End: ?
  Filled 2022-07-11: qty 15

## 2022-07-11 MED ORDER — SILVER NITRATE-POT NITRATE 75-25 % EX MISC
CUTANEOUS | Status: AC
Start: 2022-07-11 — End: ?
  Filled 2022-07-11: qty 1

## 2022-07-11 MED ORDER — PREDNISONE 5 MG PO TABS
5.0000 mg | ORAL_TABLET | Freq: Every morning | ORAL | 0 refills | Status: AC
Start: 2022-07-12 — End: 2022-07-15

## 2022-07-11 MED ORDER — RISAQUAD PO CAPS
1.0000 | ORAL_CAPSULE | Freq: Every day | ORAL | 0 refills | Status: DC
Start: 2022-07-12 — End: 2022-07-19

## 2022-07-11 MED ORDER — LABETALOL HCL 5 MG/ML IV SOLN (WRAP)
20.0000 mg | Freq: Once | INTRAVENOUS | Status: DC
Start: 2022-07-11 — End: 2022-07-11

## 2022-07-11 MED ORDER — LACTATED RINGERS IV SOLN
INTRAVENOUS | Status: DC
Start: 2022-07-11 — End: 2022-07-12

## 2022-07-11 MED ORDER — SODIUM CHLORIDE 0.9 % IV SOLN
INTRAVENOUS | Status: DC | PRN
Start: 2022-07-11 — End: 2022-07-11
  Administered 2022-07-11: 12 ug via INTRAVENOUS

## 2022-07-11 MED ORDER — FENTANYL CITRATE (PF) 50 MCG/ML IJ SOLN (WRAP)
INTRAMUSCULAR | Status: DC | PRN
Start: 2022-07-11 — End: 2022-07-11
  Administered 2022-07-11: 100 ug via INTRAVENOUS

## 2022-07-11 MED ORDER — ROCURONIUM BROMIDE 50 MG/5ML IV SOLN
INTRAVENOUS | Status: AC
Start: 2022-07-11 — End: ?
  Filled 2022-07-11: qty 5

## 2022-07-11 MED ORDER — METFORMIN HCL 500 MG PO TABS
500.0000 mg | ORAL_TABLET | Freq: Two times a day (BID) | ORAL | 1 refills | Status: DC
Start: 2022-07-11 — End: 2022-09-05

## 2022-07-11 MED ORDER — INSULIN GLARGINE 100 UNIT/ML SC SOPN
20.0000 [IU] | PEN_INJECTOR | Freq: Two times a day (BID) | SUBCUTANEOUS | 1 refills | Status: DC
Start: 2022-07-11 — End: 2022-07-19

## 2022-07-11 MED ORDER — MIDAZOLAM HCL 1 MG/ML IJ SOLN (WRAP)
INTRAMUSCULAR | Status: AC
Start: 2022-07-11 — End: ?
  Filled 2022-07-11: qty 2

## 2022-07-11 MED ORDER — HYDRALAZINE HCL 20 MG/ML IJ SOLN
10.0000 mg | Freq: Once | INTRAMUSCULAR | Status: DC
Start: 2022-07-11 — End: 2022-07-11

## 2022-07-11 MED ORDER — DEXAMETHASONE SODIUM PHOSPHATE 4 MG/ML IJ SOLN (WRAP)
INTRAMUSCULAR | Status: DC | PRN
Start: 2022-07-11 — End: 2022-07-11
  Administered 2022-07-11: 12 mg via INTRAVENOUS

## 2022-07-11 MED ORDER — EPINEPHRINE HCL (NASAL) 0.1 % NA SOLN
NASAL | Status: AC
Start: 2022-07-11 — End: ?
  Filled 2022-07-11: qty 30

## 2022-07-11 MED ORDER — FENTANYL CITRATE (PF) 50 MCG/ML IJ SOLN (WRAP)
INTRAMUSCULAR | Status: AC
Start: 2022-07-11 — End: ?
  Filled 2022-07-11: qty 2

## 2022-07-11 MED ORDER — IBUPROFEN 600 MG PO TABS
600.0000 mg | ORAL_TABLET | Freq: Four times a day (QID) | ORAL | Status: DC | PRN
Start: 2022-07-11 — End: 2022-07-13

## 2022-07-11 MED ORDER — OXYCODONE HCL 5 MG PO TABS
5.0000 mg | ORAL_TABLET | Freq: Once | ORAL | Status: DC | PRN
Start: 2022-07-11 — End: 2022-07-11

## 2022-07-11 MED ORDER — FLUTICASONE PROPIONATE 50 MCG/ACT NA SUSP
1.0000 | Freq: Two times a day (BID) | NASAL | 1 refills | Status: DC
Start: 2022-07-11 — End: 2022-07-19

## 2022-07-11 MED ORDER — ROCURONIUM BROMIDE 10 MG/ML IV SOLN (WRAP)
INTRAVENOUS | Status: DC | PRN
Start: 2022-07-11 — End: 2022-07-11
  Administered 2022-07-11: 100 mg via INTRAVENOUS

## 2022-07-11 MED ORDER — HYDROCODONE-ACETAMINOPHEN 5-325 MG PO TABS
1.0000 | ORAL_TABLET | Freq: Once | ORAL | Status: DC | PRN
Start: 2022-07-11 — End: 2022-07-11

## 2022-07-11 MED ORDER — ONDANSETRON HCL 4 MG/2ML IJ SOLN
4.0000 mg | Freq: Once | INTRAMUSCULAR | Status: DC | PRN
Start: 2022-07-11 — End: 2022-07-11

## 2022-07-11 MED ORDER — HYDROMORPHONE HCL 1 MG/ML IJ SOLN
0.5000 mg | INTRAMUSCULAR | Status: DC | PRN
Start: 2022-07-11 — End: 2022-07-11

## 2022-07-11 MED ORDER — ONDANSETRON HCL 4 MG/2ML IJ SOLN
INTRAMUSCULAR | Status: DC | PRN
Start: 2022-07-11 — End: 2022-07-11
  Administered 2022-07-11: 4 mg via INTRAVENOUS

## 2022-07-11 MED ORDER — PROPOFOL 10 MG/ML IV EMUL (WRAP)
INTRAVENOUS | Status: AC
Start: 2022-07-11 — End: ?
  Filled 2022-07-11: qty 20

## 2022-07-11 MED ORDER — MEPERIDINE HCL 25 MG/ML IJ SOLN
25.0000 mg | Freq: Once | INTRAMUSCULAR | Status: DC
Start: 2022-07-11 — End: 2022-07-11

## 2022-07-11 MED ORDER — PROPOFOL 10 MG/ML IV EMUL (WRAP)
INTRAVENOUS | Status: DC | PRN
Start: 2022-07-11 — End: 2022-07-11
  Administered 2022-07-11: 300 mg via INTRAVENOUS

## 2022-07-11 MED ORDER — LIDOCAINE HCL (PF) 2 % IJ SOLN
INTRAMUSCULAR | Status: AC
Start: 2022-07-11 — End: ?
  Filled 2022-07-11: qty 5

## 2022-07-11 MED ORDER — LIDOCAINE-EPINEPHRINE 1 %-1:100000 IJ SOLN
INTRAMUSCULAR | Status: AC
Start: 2022-07-11 — End: ?
  Filled 2022-07-11: qty 20

## 2022-07-11 MED ORDER — MIDAZOLAM HCL 1 MG/ML IJ SOLN (WRAP)
INTRAMUSCULAR | Status: DC | PRN
Start: 2022-07-11 — End: 2022-07-11
  Administered 2022-07-11: 2 mg via INTRAVENOUS

## 2022-07-11 MED ORDER — FENTANYL CITRATE (PF) 50 MCG/ML IJ SOLN (WRAP)
25.0000 ug | INTRAMUSCULAR | Status: DC | PRN
Start: 2022-07-11 — End: 2022-07-11
  Administered 2022-07-11: 25 ug via INTRAVENOUS
  Filled 2022-07-11: qty 2

## 2022-07-11 MED ORDER — MUPIROCIN 2 % EX OINT
TOPICAL_OINTMENT | CUTANEOUS | Status: AC
Start: 2022-07-11 — End: ?
  Filled 2022-07-11: qty 22

## 2022-07-11 MED ORDER — BENZOCAINE-MENTHOL MT LOZG (WRAP)
1.0000 | LOZENGE | OROMUCOSAL | Status: DC | PRN
Start: 2022-07-11 — End: 2022-07-13
  Administered 2022-07-11 – 2022-07-13 (×7): 1 via BUCCAL
  Filled 2022-07-11: qty 1

## 2022-07-11 MED ORDER — LIDOCAINE-EPINEPHRINE 1 %-1:100000 IJ SOLN
INTRAMUSCULAR | Status: DC | PRN
Start: 2022-07-11 — End: 2022-07-11
  Administered 2022-07-11: 9 mL

## 2022-07-11 MED ORDER — KETOROLAC TROMETHAMINE 15 MG/ML IJ SOLN
15.0000 mg | Freq: Once | INTRAMUSCULAR | Status: DC | PRN
Start: 2022-07-11 — End: 2022-07-11

## 2022-07-11 MED ORDER — AMOXICILLIN-POT CLAVULANATE 875-125 MG PO TABS
1.0000 | ORAL_TABLET | Freq: Two times a day (BID) | ORAL | 0 refills | Status: DC
Start: 2022-07-11 — End: 2022-07-13

## 2022-07-11 MED ORDER — DIPHENHYDRAMINE HCL 50 MG/ML IJ SOLN
12.5000 mg | Freq: Four times a day (QID) | INTRAMUSCULAR | Status: DC | PRN
Start: 2022-07-11 — End: 2022-07-11

## 2022-07-11 SURGICAL SUPPLY — 90 items
ADAPTER SCT SPEC CLT HRDWR CNSTR (Adapter) ×1
ADAPTER SUCTION SPECIMEN COLLECTION (Adapter) ×1
ADAPTER SUCTION SPECIMEN COLLECTION HARDWARE CANISTER (Adapter) ×1 IMPLANT
AGENT FLSL RECOTHROM 5ML (Hemostat) ×1
AGENT HEMOSTATIC MATRIX RECOTHROM (Hemostat) ×1
AGENT HEMOSTATIC MATRIX RECOTHROM FLOSEAL ADS202105 (Hemostat) ×1 IMPLANT
BLADE SHAVER OD2.9 MM STRAIGHT SHAFT (Blade) ×1
BLADE SHAVER OD2.9 MM STRAIGHT SHAFT IRRIGATION TUBE OFFSET CUT (Blade) ×1 IMPLANT
BLADE SHAVER OD3.5 MM M4 ROTATABLE RAD (Blade)
BLADE SHAVER OD3.5 MM M4 ROTATABLE RAD 90 (Blade) IMPLANT
BLADE SHAVER OD4 MM CUT SURFACE (ENT Supplies) ×1
BLADE SHAVER OD4 MM CUT SURFACE IRRIGATION TUBE OSCILLATE 40 D 5000 (ENT Supplies) ×1 IMPLANT
BLADE SHAVER OD4 MM M4 ROTATABLE (Blade) ×1
BLADE SHAVER OD4 MM M4 ROTATABLE STRAIGHT SHAFT OFFSET CUTTING SURFACE (Blade) ×1 IMPLANT
BLADE SHVR 360D 5K RPM XPS TRCT 4MM 11CM (Blade) ×1
BLADE SHVR 40D 5K RPM CRV OFST RAD 40 (ENT Supplies) ×1
BLADE SHVR 5K RPM TRCT XPS 2.9MM 11CM (Blade) ×1
BLADE SHVR RAD 90 3.5MM M4 ROT SPNE (Blade)
CANISTER SCT FM 3L HFLO AQUILEX ARST LF (Suction) ×1
CANISTER SUCTION 3 L FILTER FLOAT VALVE (Suction) ×1
CANISTER SUCTION 3 L FILTER FLOAT VALVE SHUTOFF GUARD POUR SPOUT (Suction) ×1 IMPLANT
COAGULATOR SCT MEGADYNE 10FR 6IN LF STRL (Suction) ×1
COAGULATOR SUCTION 6IN 10FR MEGADYNE FOOTCONTROL CNNL CRD 10FT STRL LF (Suction) ×1 IMPLANT
DEVICE BALLOON DILATATION L20 MM 1.5 MM (Balloons)
DEVICE BALLOON DILATATION L20 MM 1.5 MM IRRIGATION INTUITIVE INFLATION (Balloons) IMPLANT
DEVICE BLNDIL 1.5MM XPRESS ULT 6MM 1MM (Balloons)
DRESSING NASAL RAPID RHINO STAMMBERGER (Dressing) ×1
DRESSING NASAL RAPID RHINO STAMMBERGER SINU-FOAM DISSOLVABLE (Dressing) ×1 IMPLANT
DRESSING NSL STAMMBERGER RP RHN (Dressing) ×1
DRESSING PETRO GZE VSLN CISION 8X1IN LF (Dressing)
DRESSING PETROLATUM VASELINE L8 IN X W1 (Dressing)
DRESSING PETROLATUM VASELINE L8 IN X W1 IN GAUZE NONADHESIVE STRIP (Dressing) IMPLANT
GLOVE SRG PLISPRN 6 BGL ULTRATOUCH G 285 (Glove) ×1
GLOVE SURGICAL 6 BIOGEL ULTRATOUCH G (Glove) ×1
GLOVE SURGICAL 6 BIOGEL ULTRATOUCH G POWDER FREE ROUGH BEAD CUFF (Glove) ×1 IMPLANT
NEEDLE HPO PP RW BD PRCSNGL 18GA 1.5IN (Needles) IMPLANT
NEEDLE HPO PP RW REG BVL BD 27GA 1.25IN (Needles) ×1 IMPLANT
NEEDLE SPINAL BD OD22 GA L3 1/2 IN (Needles) ×1
NEEDLE SPINAL L3 1/2 IN REGULAR WALL QUINCKE TIP OD22 GA BD (Needles) ×1 IMPLANT
NEEDLE SPNL PP RW BD QNCK 22GA 3.5IN LF (Needles) ×1
PACKING NASAL L4 CM X W4 CM DRESSING (Packing)
PACKING NASAL L4 CM X W4 CM DRESSING SINUS STENT HYDRATE MEROGEL HYAFF (Packing) IMPLANT
PACKING NSL MRGL HYAFF 4X4CM DRSG SIN (Packing)
PEN SRGCL MARKING DEVON SKIN NONSMEAR RGLR TP FLXBL RLR GNTN VLT (Procedure Accessories) IMPLANT
PEN SRGMRK DVN LF STRL NONSMEAR REG TIP (Procedure Accessories)
PEN SURGICAL MARKING DEVON SKIN NONSMEAR (Procedure Accessories)
SHEATH ENDOSCOPIC 30 D OD4 MM ENDO-SCRUB (Sheath) ×1
SHEATH ENDOSCOPIC 30 D OD4 MM ENDO-SCRUB 2 STORZ XOMED (Sheath) ×1 IMPLANT
SHEATH ENDOSCOPIC CLEANING OD4 MM 0 D (Sheath) ×1
SHEATH ENDOSCOPIC CLEANING OD4 MM 0 D ENDO-SCRUB 2 STORZ (Sheath) ×1 IMPLANT
SHEATH ESCP 30D ESCRB2 4MM LF STRL DISP (Sheath) ×1
SHEATH ESCP CLN 0D ESCRB2 4MM STRZ (Sheath) ×1
SOLUTION ANFG ISOPRPNL FM DVN DFGR FGOUT (Procedure Accessories) ×1
SOLUTION ANTIFOG NONTOXIC NONFLAMMABLE (Procedure Accessories) ×1
SOLUTION ANTIFOG NONTOXIC NONFLAMMABLE PAD DEVON ISOPROPANOL FOAM (Procedure Accessories) ×1 IMPLANT
SOLUTION IV 0.9% NACL 1000ML VFLX LF PLS (IV Solutions) ×2
SOLUTION IV 0.9% SODIUM CHLORIDE PVC (IV Solutions) ×2
SOLUTION IV 0.9% SODIUM CHLORIDE PVC 1000 ML PH 5 PLASTIC CONTAINER (IV Solutions) ×2 IMPLANT
SPIKE TUBING 2 TRANSFER DEHP FREE BPA (Procedure Accessories)
SPIKE TUBING 2 TRANSFER DEHP FREE BPA FREE TRANSOFIX INTRAVENOUS RAPID (Procedure Accessories) IMPLANT
SPIKE TUBING TRANSOFIX LF STRL 2 TRNSF (Procedure Accessories)
SPLINT NASAL SILICONE DOYLE II SEPTUM (Respiratory Supplies)
SPLINT NASAL SILICONE DOYLE II SEPTUM COMPRESS ANTERIOR TIP INTEGRAL (Respiratory Supplies) IMPLANT
SPLINT NSL SIL DYLE II LF STRL CMPR ANT (Respiratory Supplies)
SPONGE GAUZE L4 IN X W4 IN 12 PLY CURITY (Sponge) IMPLANT
SPONGE GZE CTTN CRTY 4X4IN LF STRL 12 (Sponge)
SPONGE SRG CTTND SUTUREWELD 3X.5IN LF (Sponge) ×1
SPONGE SURGICAL L3 IN X W1/2 IN (Sponge) ×1
SPONGE SURGICAL L3 IN X W1/2 IN ABSORBENT PRECISION CUT RADIOPAQUE (Sponge) ×1 IMPLANT
SUTURE ABS CR 4-0 P-3 MTPS 18IN MFL BRN (Suture)
SUTURE CHROMIC GUT CHROMIC 4-0 P-3 L18 (Suture) IMPLANT
SUTURE NABSB 3-0 KS PRLN 30IN MFL BLU (Suture)
SUTURE PROLENE BLUE 3-0 KS L30 IN (Suture)
SUTURE PROLENE BLUE 3-0 KS L30 IN MONOFILAMENT NONABSORBABLE (Suture) IMPLANT
SWABSTICK MED 10% PVP IOD APLICARE 4IN (Prep) ×3 IMPLANT
SYRINGE 10 ML GRADUATE NONPYROGENIC DEHP (Syringes, Needles) ×2
SYRINGE 10 ML GRADUATE NONPYROGENIC DEHP FREE PVC FREE LOK MEDICAL (Syringes, Needles) ×2 IMPLANT
SYRINGE MED 10ML LL LF STRL GRAD N-PYRG (Syringes, Needles) ×2
TOWEL L26 IN X W17 IN COTTON PREWASH (Procedure Accessories) ×2
TOWEL L26 IN X W17 IN COTTON PREWASH DELINT BLUE ACTISORB SURGICAL (Procedure Accessories) ×2 IMPLANT
TOWEL SRG CTTN 26X17IN LF STRL PREWASH (Procedure Accessories) ×2
TRAY SRG HEAD AND NECK ILH (Pack) ×1
TRAY SURGICAL HEAD NECK ILH (Pack) ×1 IMPLANT
TUBE NG PVC S SMP 14FR 48IN LF STRL (Tubing) ×1
TUBE OD14 FR INTEGRAL FUNNEL CONNECTOR 2 LUMEN 5 IN 1 ADAPTER (Tubing) ×1 IMPLANT
TUBING ELECTROSURGICAL ENT SALINE (Tubing) IMPLANT
TUBING ESURG ENT SAL DISP (Tubing)
TUBING SCT PVC ARG 3/16IN 10FT LF STRL (Tubing) ×1
TUBING SUCTION ID3/16 IN L10 FT (Tubing) ×1
TUBING SUCTION ID3/16 IN L10 FT NONCONDUCTIVE STRAIGHT MALE FEMALE (Tubing) ×1 IMPLANT

## 2022-07-11 NOTE — Plan of Care (Signed)
Problem: Pain interferes with ability to perform ADL  Goal: Pain at adequate level as identified by patient  Flowsheets (Taken 07/11/2022 1513)  Pain at adequate level as identified by patient:   Identify patient comfort function goal   Assess for risk of opioid induced respiratory depression, including snoring/sleep apnea. Alert healthcare team of risk factors identified.   Assess pain on admission, during daily assessment and/or before any "as needed" intervention(s)   Reassess pain within 30-60 minutes of any procedure/intervention, per Pain Assessment, Intervention, Reassessment (AIR) Cycle   Evaluate if patient comfort function goal is met     Problem: Safety  Goal: Patient will be free from injury during hospitalization  Flowsheets (Taken 07/11/2022 1513)  Patient will be free from injury during hospitalization:   Assess patient's risk for falls and implement fall prevention plan of care per policy   Use appropriate transfer methods   Ensure appropriate safety devices are available at the bedside   Provide and maintain safe environment   Hourly rounding   Include patient/ family/ care giver in decisions related to safety  Goal: Patient will be free from infection during hospitalization  Flowsheets (Taken 07/11/2022 1513)  Free from Infection during hospitalization:   Assess and monitor for signs and symptoms of infection   Monitor lab/diagnostic results   Encourage patient and family to use good hand hygiene technique     Problem: Pain  Goal: Pain at adequate level as identified by patient  Flowsheets (Taken 07/11/2022 1513)  Pain at adequate level as identified by patient:   Identify patient comfort function goal   Assess for risk of opioid induced respiratory depression, including snoring/sleep apnea. Alert healthcare team of risk factors identified.   Assess pain on admission, during daily assessment and/or before any "as needed" intervention(s)   Reassess pain within 30-60 minutes of any  procedure/intervention, per Pain Assessment, Intervention, Reassessment (AIR) Cycle   Evaluate if patient comfort function goal is met

## 2022-07-11 NOTE — Discharge Instructions (Signed)
Amoxicillin/Clavulanate Oral Tablet  Brands: Augmentin  Uses  For treating bacterial infection.  Instructions  Take the medicine with food.  Keep the medicine at room temperature. Avoid heat and direct light.  It is important that you keep taking each dose of this medicine on time even if you are feeling well.  If you forget to take a dose on time, take it as soon as you remember. If it is almost time for the next dose, do not take the missed dose. Return to your normal schedule. Do not take 2 doses at one time.  Tell your doctor and pharmacist about all your medicines. Include prescription and over-the-counter medicines, vitamins, and herbal medicines.  Keep using this medicine for the full number of days that it is prescribed. Do not stop the medicine even if you start to feel better.  If you have diabetes and use urine glucose tests, this medicine may cause incorrect results. Please check with your doctor before making any changes to your diabetes treatment plan.  Cautions  Tell your doctor and pharmacist if you ever had an allergic reaction to a medicine.  Do not use the medication any more than instructed.  Please tell your doctor if you have moderate to severe diarrhea while on this medicine. Do not treat the diarrhea with over-the-counter diarrhea medicine.  Tell the doctor or pharmacist if you are pregnant, planning to be pregnant, or breastfeeding.  Do not start or stop any other medicines without first speaking to your doctor or pharmacist.  Do not share this medicine with anyone who has not been prescribed this medicine.  Side Effects  The following is a list of some common side effects from this medicine. Please speak with your doctor about what you should do if you experience these or other side effects.  diarrhea  nausea and vomiting  stomach upset or abdominal pain  Call your doctor or get medical help right away if you notice any of these more serious side effects:  severe or persistent abdominal  pain  severe, watery or bloody diarrhea  signs of liver damage (such as yellowing of eye or skin, dark urine, or unusual tiredness)  red, burning, or itchy skin  yeast infection of mouth  vaginal itching or discharge  A few people may have an allergic reaction to this medicine. Symptoms can include difficulty breathing, skin rash, itching, swelling, or severe dizziness. If you notice any of these symptoms, seek medical help quickly.  Extra  Please speak with your doctor, nurse, or pharmacist if you have any questions about this medicine.  [email or web address]  IMPORTANT NOTE: This document tells you briefly how to take your medicine, but it does not tell you all there is to know about it. Your doctor or pharmacist may give you other documents about your medicine. Please talk to them if you have any questions. Always follow their advice. There is a more complete description of this medicine available in English. Scan this code on your smartphone or tablet or use the web address below. You can also ask your pharmacist for a printout. If you have any questions, please ask your pharmacist. The display and use of this drug information is subject to Terms of Use. Copyright(c) 2023 [employer]    2000-2023 [employer] All rights reserved. This information is not intended as a substitute for professional medical care. Always follow your healthcare professional's instructions.

## 2022-07-11 NOTE — Op Note (Signed)
FULL OPERATIVE NOTE    Date Time: 07/11/22 4:06 PM  Patient Name: [name], [name] (MRN: [id number])  Attending Physician: Bertrum Sol, MD      Date of Operation:   07/11/2022    Providers Performing:   Surgeon(s) and Role:     Francis Gaines, MD - Primary    Surgical First Assistant(s):   None    Operative Procedure:   ENDOSCOPIC SINUS SURGERY (FESS) Right subtotal ethmoidectomy with frontal sinusotomy, Right sphenoidectomy with removal of content, right inferior turbinate outfracture and reduction, Right nasopharyngeal biopsy:     Preoperative Diagnosis:   Complicated acute on chronic sinusitis  Inferior turbinate hypertrophy    Postoperative Diagnosis:   Complicated acute on chronic sinusitis  Inferior turbinate hypertrophy    Anesthesia:   General    Findings:   Trapped purulent content within the right sphenoid sinus and within the ethmoid cavity with thickened yellow secretions mimicking allergic fungal sinusitis contents.  Frozen section on this content was negative for fungal elements.  Significant inflammation within the right sinonasal airway with mucosal thickening.  Adenoid hypertrophy.  Nasopharyngeal content was biopsied and sent for evaluation and the adenoid tissue on the right side was gently reduced.      Indications:   Severe headaches not fully responsive to antibiotics and steroids with concerning CT and MRI radiographic features    Operative Notes:   Pledgets soaked with epinephrine were placed on the right side.  Using a 30 degree endoscope the right side nasal passageway was subsequently inspected.  Local was injected along the inferior and middle turbinates and within the region of the sphenopalatine ganglion.  The inferior turbinate was reduced and outfractured.  Adenoid hypertrophy/soft tissue was noted in the nasopharynx.  This was biopsied.  The soft tissue and the nasopharynx was slightly reduced on the right side.  The lateral aspect of the concha bullosa was removed.  An  ethmoidectomy was performed using Tru-Cut forceps and a 4 mm microdebrider blade.  The sphenoid sinus was entered.  Some polypoid tissue was encountered.  Thick trapped secretions within the ethmoid cavity along the lamina and within the sphenoid sinus was encountered.  The content was removed and sent for frozen section.  Thorough irrigation was performed.  Frozen section came back negative for fungal elements or malignancy, deferring to permanent.  Cultures were obtained.  Hemostasis was assured.  Stammberger foam was applied.  An orogastric tube was passed and stomach and oropharyngeal content was suctioned clear.              Estimated Blood Loss:   * No values recorded between 07/11/2022 12:53 PM and 07/11/2022  2:33 PM *    Implants:   * No implants in log *       Drains:   Drains: no      Specimens:     ID Type Source Tests Collected by Time Destination   A : Rigth nasopharynx ruleout lymphoma Not Applicable Nasopharyngeal SURGICAL PATHOLOGY Francis Gaines, MD 07/11/2022 1326    B : Right sinus contents Contents Sinus SURGICAL PATHOLOGY Francis Gaines, MD 07/11/2022 1333    C : Right ethmoid sinus 1 Not Applicable Sinus SURGICAL PATHOLOGY Francis Gaines, MD 07/11/2022 1338    D : Right ethmoid sinus 2 Not Applicable Sinus SURGICAL PATHOLOGY Francis Gaines, MD 07/11/2022 1339    E : Rigth sphenoid wall Not Applicable Sinus SURGICAL PATHOLOGY Francis Gaines, MD 07/11/2022 1355  Complications:   None    Signed by: Francis Gaines, MD  [place] [facility]

## 2022-07-11 NOTE — Anesthesia Preprocedure Evaluation (Signed)
Anesthesia Evaluation    AIRWAY    Mallampati: II    TM distance: >3 FB  Neck ROM: full  Mouth Opening:full   CARDIOVASCULAR    cardiovascular exam normal       DENTAL    no notable dental hx               PULMONARY    pulmonary exam normal     OTHER FINDINGS                                        Relevant Problems   NEURO/PSYCH   (+) Acute intractable headache, unspecified headache type               Anesthesia Plan    ASA 1     general                                                      Signed by: Kym Groom, MD 07/11/22 9:07 AM

## 2022-07-11 NOTE — Plan of Care (Signed)
Problem: Pain interferes with ability to perform ADL  Goal: Pain at adequate level as identified by patient  Flowsheets (Taken 07/11/2022 1513 by Arlana Lindau, RN)  Pain at adequate level as identified by patient:   Identify patient comfort function goal   Assess for risk of opioid induced respiratory depression, including snoring/sleep apnea. Alert healthcare team of risk factors identified.   Assess pain on admission, during daily assessment and/or before any "as needed" intervention(s)   Reassess pain within 30-60 minutes of any procedure/intervention, per Pain Assessment, Intervention, Reassessment (AIR) Cycle   Evaluate if patient comfort function goal is met     Problem: Safety  Goal: Patient will be free from injury during hospitalization  Outcome: Progressing  Flowsheets (Taken 07/11/2022 1513 by Arlana Lindau, RN)  Patient will be free from injury during hospitalization:   Assess patient's risk for falls and implement fall prevention plan of care per policy   Use appropriate transfer methods   Ensure appropriate safety devices are available at the bedside   Provide and maintain safe environment   Hourly rounding   Include patient/ family/ care giver in decisions related to safety     Problem: Pain  Goal: Pain at adequate level as identified by patient  Flowsheets (Taken 07/11/2022 1513 by Arlana Lindau, RN)  Pain at adequate level as identified by patient:   Identify patient comfort function goal   Assess for risk of opioid induced respiratory depression, including snoring/sleep apnea. Alert healthcare team of risk factors identified.   Assess pain on admission, during daily assessment and/or before any "as needed" intervention(s)   Reassess pain within 30-60 minutes of any procedure/intervention, per Pain Assessment, Intervention, Reassessment (AIR) Cycle   Evaluate if patient comfort function goal is met

## 2022-07-11 NOTE — Progress Notes (Addendum)
[facility]PITAL[facility] Progress Note  Patient Info:   Date/Time: 07/11/2022 / 5:22 PM   Admit Date:07/07/2022  Patient Name:[name]   UUV:[id number]   PCP: Oneita Hurt None, MD  Attending Physician:Anita Laguna, Harrell Gave, MD  Assessment/Plan:     21 year old with history of diabetes comes in with intractable headache secondary to complete opacification of ethmoid sinuses admitted to sinusitis    Ethmoid sinusitis with complete opacification  MRI face: Complete opacification of the right sphenoid sinus with proteinaceous nonenhancing contents  Started Unasyn 3 g IV every 6->switch to augmentin x 3 weeks  Change dexamethasone to prednisone taper  Discussed with ENT   S/p OR on 11/22    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.  Hgba1c: 9%  Normally takes Lantus 10 units twice a day without any short acting, but after starting steroids increased to 3 times a day  Contin Lantus 25 units twice a day  Start Humalog 5 units 3 times daily; increase to high sliding scale  Lower prednisone to 5 mg daily for additional 3 days only  Will need to discharge on higher dose of Lantus 20 units twice a day, does not use Humalog.  States that his PCP was going to start him on humalog in the near future. Will leave that up to PCP since will need a close follow up  Start metformin 500 mg bid    Intractable headache secondary to sinusitis  Continue Fioricet as needed we will also add Toradol as needed    Disposition: Father uncomfortable with him coming home.  Will keep overnight and discharge in a.m.       DVT Prophylaxis:Medication VTE Prophylaxis Orders: enoxaparin (LOVENOX) syringe 40 mg  Mechanical VTE Prophylaxis Orders: Maintain sequential compression device   Central Line/Foley Catheter/PICC line status: None  Code Status: Full Code  Disposition:Planning to discharge patient to home  Type of Admission:Inpatient  Hospital Problems:     Active Hospital Problems    Diagnosis    Acute intractable headache, unspecified headache type     Subjective:    Chief Complaint:  Headache and Nausea    07/11/22   ROS    No chest pain or difficulty breathing  Has some headache but improved from yesterday  No nausea vomiting  Abdominal pain improved  No fever    Objective:     Vitals:    07/11/22 1536 07/11/22 1540 07/11/22 1550 07/11/22 1638   BP: 145/67 143/74 143/74 142/83   Pulse: (!) 55 (!) 51 (!) 54 (!) 58   Resp: 19 19 17 17    Temp:    98.6 F (37 C)   TempSrc:    Oral   SpO2: 95% 94% 95% 95%   Weight:       Height:         Physical Exam:   Physical Exam    General no apparent distress  Chest clear to auscultation  Heart regular rate rhythm  Abdomen BS present soft  Extremity no edema  Current Medications      Scheduled Meds: PRN Meds:    ampicillin-sulbactam, 3 g, Intravenous, Q6H  enoxaparin, 40 mg, Subcutaneous, Daily  fluticasone, 1 spray, Each Nare, BID  insulin glargine, 25 Units, Subcutaneous, Q12H SCH  insulin lispro, 1-6 Units, Subcutaneous, QHS  insulin lispro, 2-10 Units, Subcutaneous, TID AC  insulin lispro, 5 Units, Subcutaneous, TID AC  lactobacillus/streptococcus, 1 capsule, Oral, Daily  predniSONE, 5 mg, Oral, QAM W/BREAKFAST  saline, 2 spray, Each Nare, TID  Continuous Infusions:   lactated ringers 500 mL/hr at 07/11/22 1253    acetaminophen, 650 mg, Q4H PRN  benzocaine-menthol, 1 lozenge, Q1H PRN  benzonatate, 100 mg, TID PRN  butalbital-acetaminophen-caffeine, 1 tablet, Q4H PRN  artificial tears (REFRESH PLUS), 1 drop, TID PRN  dextrose, 15 g of glucose, PRN   Or  dextrose, 12.5 g, PRN   Or  dextrose, 12.5 g, PRN   Or  glucagon (rDNA), 1 mg, PRN  magnesium sulfate, 1 g, PRN  melatonin, 3 mg, QHS PRN  naloxone, 0.2 mg, PRN  potassium & sodium phosphates, 2 packet, PRN  potassium chloride, 0-40 mEq, PRN   And  potassium chloride, 10 mEq, PRN  saline, 2 spray, Q4H PRN          Results of Labs/imaging   Labs for the last 24 hours have been reviewed:   Coagulation Profile:   Recent Labs   Lab 07/05/22  1802   PT 12.2   PT INR 1.1         CBC  review:   Recent Labs   Lab 07/11/22  0653 07/10/22  0738 07/09/22  0640 07/08/22  0647 07/07/22  1129   WBC 13.86* 12.74* 15.88* 13.57* 13.49*   Hgb 15.4 15.4 15.3 16.0 15.8   Hematocrit 43.7 45.4 43.7 45.6 45.1   Platelets 231 240 251 259 253   MCV 80.2 81.4 79.9 79.6 79.3   RDW 12 12 12 12 13    Neutrophils 60.6 56.6 76.2 85.0 68.9   Neutrophils Absolute 8.41* 7.21* 12.10* 11.53* 9.30*   Lymphocytes Automated 28.1 33.5 15.7 10.3 22.7   Eosinophils Automated 2.6 2.0 0.2 0.0 0.7   Immature Granulocytes 0.9 0.8 0.9 0.8 0.4   Immature Granulocytes Absolute 0.12* 0.10* 0.15* 0.11* 0.05       Chem Review:  Recent Labs   Lab 07/09/22  0640 07/08/22  0647 07/07/22  1129 07/05/22  1802   Sodium 136 135 135 138   Potassium 4.3 4.4 4.0 4.2   Chloride 104 102 99 104   CO2 26 25 27 26    BUN 22.0 15.0 14.0 13.0   Creatinine 1.3 1.1 1.3 1.2   Glucose 350* 311* 228* 271*   Calcium 9.1 9.4 9.6 9.3   Magnesium  --   --  1.6  --    Bilirubin, Total  --   --  0.6  --    AST (SGOT)  --   --  12  --    ALT  --   --  26  --    Alkaline Phosphatase  --   --  65  --        Results       Procedure Component Value Units Date/Time    Culture + Gram Stain,Aerobic, CSF [[id number]] Collected: 07/07/22 1259    Specimen: Cerebrospinal Fluid from CSF (Lumbar Puncture Spinal Fluid) Updated: 07/11/22 1645    Narrative:      ORDER#: [id number]                                    ORDERED BY: MEHTA, SAMEER  SOURCE: CSF (Lumbar Puncture Spinal Fluid)           COLLECTED:  07/07/22 12:59  ANTIBIOTICS AT COLL.:  RECEIVED :  07/07/22 18:26  Stain, Gram                                FINAL       07/07/22 20:10  07/07/22   Stain performed on Cytospin (concentrated) specimen             Few WBCs             No organisms seen  Culture and Gram Stain, Aerobic, CSF       FINAL       07/11/22 16:45  07/11/22   No Growth      Glucose Whole Blood - POCT [[id number]]  (Abnormal) Collected: 07/11/22 1635     Updated: 07/11/22 1642     Whole  Blood Glucose POCT 247 mg/dL     Fungal Culture & Smear [[id number]] Collected: 07/11/22 1400    Specimen: Other from Tissue Updated: 07/11/22 1515    Culture, Anaerobic Bacteria [[id number]] Collected: 07/11/22 1400    Specimen: Other from Tissue Updated: 07/11/22 1515    AFB Culture & Smear [[id number]] Collected: 07/11/22 1400    Specimen: Sputum from Tissue, Deep Updated: 07/11/22 1515    Fungal Culture & Smear [[id number]] Collected: 07/11/22 1330    Specimen: Other from Tissue Updated: 07/11/22 1513    Culture + Gram Stain,Aerobic, Wound [[id number]] Collected: 07/11/22 1330    Specimen: Wound from Incision Updated: 07/11/22 1513    AFB Culture & Smear [[id number]] Collected: 07/11/22 1330    Specimen: Sputum from Tissue, Deep Updated: 07/11/22 1512    Culture, Anaerobic Bacteria [[id number]] Collected: 07/11/22 1330    Specimen: Other from Tissue Updated: 07/11/22 1512    Culture + Gram Stain,Aerobic, Wound [[id number]] Collected: 07/11/22 1400    Specimen: Wound from Incision Updated: 07/11/22 1511    Glucose Whole Blood - POCT [[id number]]  (Abnormal) Collected: 07/11/22 1440     Updated: 07/11/22 1445     Whole Blood Glucose POCT 184 mg/dL     Glucose Whole Blood - POCT [[id number]]  (Abnormal) Collected: 07/11/22 1141     Updated: 07/11/22 1143     Whole Blood Glucose POCT 190 mg/dL     Glucose Whole Blood - POCT [[id number]]  (Abnormal) Collected: 07/11/22 0744     Updated: 07/11/22 0829     Whole Blood Glucose POCT 193 mg/dL     CBC and differential [[id number]]  (Abnormal) Collected: 07/11/22 0653    Specimen: Blood Updated: 07/11/22 0731     WBC 13.86 x10 3/uL      Hgb 15.4 g/dL      Hematocrit 35.0 %      Platelets 231 x10 3/uL      RBC 5.45 x10 6/uL      MCV 80.2 fL      MCH 28.3 pg      MCHC 35.2 g/dL      RDW 12 %      MPV 10.7 fL      Instrument Absolute Neutrophil Count 8.41 x10 3/uL      Neutrophils 60.6 %      Lymphocytes Automated 28.1 %      Monocytes 7.4 %      Eosinophils Automated 2.6 %       Basophils Automated 0.4 %      Immature Granulocytes 0.9 %      Nucleated RBC 0.0 /100 WBC      Neutrophils Absolute  8.41 x10 3/uL      Lymphocytes Absolute Automated 3.89 x10 3/uL      Monocytes Absolute Automated 1.03 x10 3/uL      Eosinophils Absolute Automated 0.36 x10 3/uL      Basophils Absolute Automated 0.05 x10 3/uL      Immature Granulocytes Absolute 0.12 x10 3/uL      Absolute NRBC 0.00 x10 3/uL     Glucose Whole Blood - POCT [[id number]]  (Abnormal) Collected: 07/10/22 2131     Updated: 07/10/22 2145     Whole Blood Glucose POCT 316 mg/dL           Radiology reports have been reviewed:  Radiology Results (24 Hour)       ** No results found for the last 24 hours. **          MRI Orbit Face Neck W WO Contrast    Result Date: 07/08/2022  HISTORY: Right-sided sinonasal disease COMPARISON: CT sinus 07/07/2022 TECHNIQUE: MRI of the face performed on a 3.0 Tesla scanner without and with intravenous contrast. CONTRAST: gadoterate meglumine (CLARISCAN) 10 MMOL/20ML injection 28 mL FINDINGS: The right sphenoid sinus is completely opacified by mildly intrinsically T1 hyperintense, T2 hypointense, nonenhancing material. This may represent a mucocele versus chronically inspissated secretions. There is no underlying enhancing mass lesion. No intracranial or sellar extension. There is mild scattered benign appearing mucosal thickening elsewhere. The visualized portions of the brain and orbits are unremarkable.     Complete opacification of the right sphenoid sinus with proteinaceous nonenhancing contents. Findings may represent mucocele versus chronically inspissated secretions. There is no enhancing mass lesion. No intracranial extension of disease. Brenton Grills, MD 07/08/2022 8:03 AM    CT Sinus Facial Bones with Contrast    Result Date: 07/07/2022  HISTORY: Sinusitis evaluation COMPARISON: A head CT from the same day, dictated separately. TECHNIQUE: CT of the sinuses performed with intravenous contrast. Multiplanar  reformatted images were created and reviewed. The following dose reduction techniques were utilized: automated exposure control and/or adjustment of the mA and/or KV according to patient size, and the use of an iterative reconstruction technique. CONTRAST: iohexol (OMNIPAQUE) 350 MG/ML injection 100 mL FINDINGS: There is severe opacification of the mid and posterior right ethmoid air cells and right sphenoid sinus with occlusion of the right sphenoethmoidal recess. There is involvement of the right nasal cavity most pronounced posteriorly. There is increased density within the secretions. There is bowing of the right lamina papyracea without obvious orbital extension of disease. There is mild mucosal thickening of the left ethmoid air cells and left sphenoid sinus with occlusion of the left sphenoethmoidal recess. There is mild mucosal thickening of the maxillary sinuses with a potential retention cyst in the right maxillary sinus. There is occlusion of the infundibula. There are secretions in the upper left nasal cavity. There is leftward nasal septal deviation. The retromaxillary fat planes and pterygopalatine fissures are intact. The adenoids and palatine tonsils are prominent. There is no abnormal enhancement within the soft tissues. Limited imaging of the brain is unremarkable. The mastoid air cells and middle ear cavities are clear. There is an impacted tooth with surrounding lucency situated between the right maxillary cuspid and first bicuspid teeth.     1.There is severe mid-posterior right ethmoid air cell and right sphenoid sinus opacification. There is bowing of the right lamina papyracea. There is involvement of the right nasal cavity. There is increased attenuation within the secretions. Diagnostic possibilities include but are not limited to a  mucocele and underlying paranasal sinus disease, severe paranasal sinus disease with inspissated secretions, fungal disease or polyposis. 2.Mild mucosal  thickening within the left ethmoid air cells, left sphenoid sinus and both maxillary sinuses. 3.Prominent adenoids and palatine tonsils. Correlation with the patient's immune status may be of benefit. There is an impacted tooth with surrounding lucency situated between the right maxillary cuspid and first bicuspid teeth. Otho Ket, DO 07/07/2022 3:10 PM    CT Head WO Contrast    Result Date: 07/07/2022  HISTORY: persistent headaches COMPARISON: CT head 07/05/2022 TECHNIQUE: CT of the head performed without intravenous contrast. The following dose reduction techniques were utilized: automated exposure control and/or adjustment of the mA and/or KV according to patient size, and the use of an iterative reconstruction technique. CONTRAST: None. FINDINGS: Cerebral ventricles, cortical sulci, basilar cisterns are within normal limits. Gray-white differentiation is within normal limits. No acute intracranial hemorrhage or mass effect identified. Calvarium appears intact. There is again prominent right sphenoid sinus and posterior right ethmoid air cell disease mildly expanded contours. High attenuation material within these sinuses is likely on the basis of inspissated secretions.      1.  No acute intracranial hemorrhage, mass effect, or hydrocephalus. 2.  Prominent right sphenoid sinus and posterior right ethmoid air cell disease, 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 Melody Haver, MD 07/07/2022 11:51 AM    CT Angiogram Head    Result Date: 07/05/2022  HISTORY: Headache x3 weeks. Reportedly 10 out of 10. No improvement despite treatment. NML neuro exam. No prior hx of aneurysm. COMPARISON: None available. Correlation made with unenhanced CT of the head, performed same day reported separately. TECHNIQUE: CT angiogram of the head performed with intravenous contrast. Multiplanar reformatted and 3D maximum intensity projection (MIP) images were created and reviewed. The following dose reduction techniques were utilized: automated  exposure control and/or adjustment of the mA and/or KV according to patient size, and the use of an iterative reconstruction technique. CONTRAST: iohexol (OMNIPAQUE) 350 MG/ML injection 80 mL FINDINGS: The intracranial right internal carotid artery is patent.  The intracranial left internal carotid artery is patent.  Both the intracranial vertebral arteries are patent.  The basilar artery is normal in caliber.  The proximal visualized portions of the anterior cerebral, middle cerebral and posterior cerebral arteries are patent. No aneurysm or AVM is demonstrated.     1.Wide patency of the intracranial arterial circulation. 2.No aneurysm or AVM detected. Waynard Edwards, MD 07/05/2022 6:36 PM    CT Head without Contrast    Result Date: 07/05/2022  HISTORY: Persistent headaches for 3 weeks. COMPARISON: Comparison exam dated 07/01/2022. Correlation made with intracranial CT angiography, performed same day reported separately. TECHNIQUE: CT of the head performed without intravenous contrast. The following dose reduction techniques were utilized: automated exposure control and/or adjustment of the mA and/or KV according to patient size, and the use of an iterative reconstruction technique. CONTRAST: None. FINDINGS: The ventricles are normal in configuration. No intracranial hemorrhage, mass-effect or midline shift is present. No extra-axial fluid collections are seen. There is no CT evidence of an acute cortical infarct. The parenchyma is unremarkable in attenuation. The calvarium is intact. Redemonstrated is complete opacification of the posterior right-sided ethmoid air cells with near complete opacification of the right sphenoid sinus by membrane thickening and fluid which exhibits heterogeneous hyperattenuation. This is nonspecific and can be seen in the setting of inspissated, protein-rich secretions and/or superimposed fungal infection. This irregular soft tissue extends into the posterior-superior aspect of the  right  nasal cavity near the nasal choana. There is no abnormal distention or hyperattenuation of either cavernous sinus. The balance of the visualized paranasal sinuses, middle ear cavities and mastoid air cells are well-pneumatized and clear. The orbital contents are normal. Adenoidal hypertrophy is present.      1.No CT evidence of acute intracranial abnormality. 2.Severe right sphenoid and posterior ethmoid sinusitis for which otolaryngology follow-up consultation and evaluation is again advised. An obstructive lesion at the right sphenoethmoidal recess is not excluded and endoscopic or surgical evaluation should be considered. Waynard Edwards, MD 07/05/2022 6:32 PM    CT Head WO Contrast    Result Date: 07/01/2022  HISTORY: Headache, chronic, new features or increased frequency COMPARISON: None available. TECHNIQUE: CT of the head performed without intravenous contrast. The following dose reduction techniques were utilized: automated exposure control and/or adjustment of the mA and/or KV according to patient size, and the use of an iterative reconstruction technique. CONTRAST: None. FINDINGS: The ventricles are normal in configuration. No intracranial hemorrhage, mass-effect or midline shift is present. No extra-axial fluid collections are seen. There is no CT evidence of an acute cortical infarct. The parenchyma is unremarkable in attenuation. The calvarium is intact. There is complete opacification of the posterior right-sided ethmoid air cells with near complete opacification of the right sphenoid sinus by membrane thickening and fluid which exhibits heterogeneous hyperattenuation. This is nonspecific and can be seen in the setting of inspissated, protein-rich secretions and/or superimposed fungal infection. This irregular soft tissue extends into the posterior-superior aspect of the right nasal cavity near the nasal choana. The balance of the visualized paranasal sinuses, middle ear cavities and mastoid air  cells are well-pneumatized and clear. The orbital contents are normal. Adenoidal hypertrophy is present.      1.No CT evidence of acute intracranial abnormality. 2.Right sphenoid and posterior ethmoid sinusitis for which otolaryngology follow-up consultation is advised. An obstructive lesion at the right sphenoethmoidal recess is not excluded. Waynard Edwards, MD 07/01/2022 9:04 PM    Pathology:   Specimens (From admission, onward)      None          Last EKG Result       None          Hospitalist   Signed by:   Bertrum Sol, MD  07/11/2022 5:22 PM  Time spent in evaluating and managing the care of the patient including face-to-face and non-face-to-face: 50 minutes    *This note was generated by the Epic EMR system/ Dragon speech recognition and may contain inherent errors or omissions not intended by the user. Grammatical errors, random word insertions, deletions, pronoun errors and incomplete sentences are occasional consequences of this technology due to software limitations. Not all errors are caught or corrected. If there are questions or concerns about the content of this note or information contained within the body of this dictation they should be addressed directly with the author for clarification

## 2022-07-11 NOTE — Brief Op Note (Signed)
BRIEF OP NOTE    Date Time: 07/11/22 4:04 PM    Patient Name:   [name], [name] (MRN: [id number])    Date of Operation:   07/11/2022     Providers Performing:   Surgeon(s) and Role:     * Francis Gaines, MD - Primary    Surgical First Assistant(s):   None    Operative Procedure:   ENDOSCOPIC SINUS SURGERY (FESS) Right subtotal ethmoidectomy with frontal sinusotomy, Right sphenoidectomy with removal of content, right inferior turbinate outfracture and reduction, Right nasopharyngeal biopsy:     Preoperative Diagnosis:   Complicated acute sinusitis    Postoperative Diagnosis:   Complicated acute sinusitis    Findings:   Right sphenoid sinus and ethmoid cavity purulent content and thickened secretions resembling fungal sinusitis content, but negative for fungus on frozen section. 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    Anesthesia:   General    Estimated Blood Loss:    15 cc    Implants:   * No implants in log *        Drains:   Drains: no            Specimens:     ID Type Source Tests Collected by Time Destination   A : Rigth nasopharynx ruleout lymphoma Not Applicable Nasopharyngeal SURGICAL PATHOLOGY Francis Gaines, MD 07/11/2022 1326    B : Right sinus contents Contents Sinus SURGICAL PATHOLOGY Francis Gaines, MD 07/11/2022 1333    C : Right ethmoid sinus 1 Not Applicable Sinus SURGICAL PATHOLOGY Francis Gaines, MD 07/11/2022 1338    D : Right ethmoid sinus 2 Not Applicable Sinus SURGICAL PATHOLOGY Francis Gaines, MD 07/11/2022 1339    E : Rigth sphenoid wall Not Applicable Sinus SURGICAL PATHOLOGY Francis Gaines, MD 07/11/2022 1355           Complications:   None     Signed by: Francis Gaines, MD                                                                           [facility]

## 2022-07-11 NOTE — Discharge Instr - AVS First Page (Addendum)
Reason for your Hospital Admission:  Acute purulence sinusitis requiring surgery    Instructions for after your discharge:  Augmentin 875 mg twice a day for 3 weeks  Take a probiotic while on antibiotics  Flonase in both nares one spray twice a day  Take prednisone 5 mg daily for 3 days only  Increase Lantus to 20 units twice a day  Start metformin 500 mg daily for 1 week then 500 mg twice a day   You will need to perform light activity for the next few weeks including not bending over, trying to avoid sneezing and coughing.         For 2 weeks:  ? No nose blowing  ? Sneeze and cough with your mouth open  ? Do not bend over excessively  ? Light activity  ? No lifting > 10 lbs  ? Keep yourself well hydrated  ? Sleep with your head at 30 degrees or more  Pain Control:  ? Tylenol, Acetaminophen, Motrin, Advil, or Ibuprofen for pain (the latter 3 are preferred)  ? Do not take NSAIDs on an empty stomach  ? Do not take NSAID medication in case of allergies or sensitivity  ? Please make sure to not go beyond the recommended dose and duration of NSAID medication during each  application and over a 24 hour period  ? Take narcotics only if necessary (you cannot drive, operate heavy machinery, or make important decisions  while on narcotics as they can impair your judgement)  Nose Bleeding:  ? Some bleeding is expected over the first 48-72 hours (this may be heavier over the first several hours post  surgery)  ? Pinch the soft part of your nose for 15 minute intervals, with Afrin application on each side after the first 15  minutes, until the bleeding stops/slows down  ? Keep your head slightly forward and not back  ? Call the doctor in case of significant bleeding that fails to stop after several attempts to apply pressure  Call Dr. Rondel Jumbo at [phone] with:  ? Questions  ? Case of facial swelling  ? Blurry vision  ? Double vision  ? Severe headache  ? Significant neck stiffness with fevers and light sensitivity  ?  Excessive bleeding that does not stop with aforementioned instructions  Additional Notes:  ? Steam showers and humidifiers are helpful in keeping your mucosa moist and can help with nasal  congestion, depending on the extent of nasal packing  ? Start sinus rinses the next day after the procedure, twice a day as instructed  ? You will be asked to use a nasal steroid spray twice a day the next day (this may be budesonide, flonase, or  dymista)  ? Please complete your course of prednisone and antibiotic if and as prescribed  ? Call 911 in case of chest pain or shortness of breath or any form of medical emergency  Follow up:  ? Please call to make an appointment (this is typically 2 weeks after surgery)

## 2022-07-11 NOTE — Brief Op Note (Signed)
BRIEF OP NOTE    Date Time: 07/11/22 4:27 PM    Patient Name:   [name], [name] (MRN: [id number])    Date of Operation:   07/11/2022     Providers Performing:   Surgeon(s) and Role:     * Francis Gaines, MD - Primary    Surgical First Assistant(s):   None    Operative Procedure:   ENDOSCOPIC SINUS SURGERY (FESS) Right subtotal ethmoidectomy with frontal sinusotomy, Right sphenoidectomy with removal of content, right inferior turbinate outfracture and reduction, Right nasopharyngeal biopsy:     Preoperative Diagnosis:   Complicated acute on chronic sinusitis  Inferior turbinate hypertrophy    Postoperative Diagnosis:   Complicated acute on chronic sinusitis  Inferior turbinate hypertrophy    Findings:   Trapped purulent content within the right sphenoid sinus and within the ethmoid cavity with thickened yellow secretions mimicking allergic fungal sinusitis contents.  Frozen section on this content was negative for fungal elements.  Significant inflammation within the right sinonasal airway with mucosal thickening.  Adenoid hypertrophy.  Nasopharyngeal content was biopsied and sent for evaluation and the adenoid tissue on the right side was gently reduced.    Anesthesia:   General    Estimated Blood Loss:   30 cc    Implants:   * No implants in log *        Drains:   Drains: no            Specimens:     ID Type Source Tests Collected by Time Destination   A : Rigth nasopharynx ruleout lymphoma Not Applicable Nasopharyngeal SURGICAL PATHOLOGY Francis Gaines, MD 07/11/2022 1326    B : Right sinus contents Contents Sinus SURGICAL PATHOLOGY Francis Gaines, MD 07/11/2022 1333    C : Right ethmoid sinus 1 Not Applicable Sinus SURGICAL PATHOLOGY Francis Gaines, MD 07/11/2022 1338    D : Right ethmoid sinus 2 Not Applicable Sinus SURGICAL PATHOLOGY Francis Gaines, MD 07/11/2022 1339    E : Rigth sphenoid wall Not Applicable Sinus SURGICAL PATHOLOGY Francis Gaines, MD 07/11/2022 1355           Complications:   None      Signed by: Francis Gaines, MD                                                                           [place] [facility]

## 2022-07-11 NOTE — Transfer of Care (Signed)
Anesthesia Transfer of Care Note    Patient: [name]    Procedures performed: Procedure(s):  ENDOSCOPIC SINUS SURGERY (FESS) Right subtotal ethmoidectomy with frontal sinusotomy, Right sphenoidectomy with removal of content, right inferior turbinate outfracture and reduction, Right nasopharyngeal biopsy    Anesthesia type: General ETT    Patient location:Phase I PACU    Last vitals:   Vitals:    07/11/22 1432   BP: 129/71   Pulse: 65   Resp: (!) 23   Temp: 36.6 C (97.9 F)   SpO2: 100%       Post pain: Patient not complaining of pain, continue current therapy      Mental Status:awake and alert     Respiratory Function: tolerating room air    Cardiovascular: stable    Nausea/Vomiting: patient not complaining of nausea or vomiting    Hydration Status: adequate    Post assessment: no apparent anesthetic complications, no reportable events, and no evidence of recall    Signed by: Neta Mends, CRNA  07/11/22 2:32 PM

## 2022-07-12 DIAGNOSIS — J0120 Acute ethmoidal sinusitis, unspecified: Secondary | ICD-10-CM

## 2022-07-12 LAB — GLUCOSE WHOLE BLOOD - POCT
Whole Blood Glucose POCT: 215 mg/dL — ABNORMAL HIGH (ref 70–100)
Whole Blood Glucose POCT: 182 mg/dL — ABNORMAL HIGH (ref 70–100)
Whole Blood Glucose POCT: 142 mg/dL — ABNORMAL HIGH (ref 70–100)
Whole Blood Glucose POCT: 264 mg/dL — ABNORMAL HIGH (ref 70–100)
Whole Blood Glucose POCT: 229 mg/dL — ABNORMAL HIGH (ref 70–100)

## 2022-07-12 NOTE — Plan of Care (Signed)
Problem: Pain interferes with ability to perform ADL  Goal: Pain at adequate level as identified by patient  Outcome: Progressing     Problem: Side Effects from Pain Analgesia  Goal: Patient will experience minimal side effects of analgesic therapy  Outcome: Progressing     Problem: Safety  Goal: Patient will be free from injury during hospitalization  Outcome: Progressing  Goal: Patient will be free from infection during hospitalization  Outcome: Progressing     Problem: Pain  Goal: Pain at adequate level as identified by patient  Outcome: Progressing     Problem: Psychosocial and Spiritual Needs  Goal: Demonstrates ability to cope with hospitalization/illness  Outcome: Progressing

## 2022-07-12 NOTE — Progress Notes (Signed)
[facility]ITAL[facility] Progress Note  Patient Info:   Date/Time: 07/12/2022 / 2:21 PM   Admit Date:07/07/2022  Patient Name:[name]   ZOX:[id number]   PCP: Pcp, None, MD  Attending Physician:Carlisle Enke, Konrad Saha, MD  Assessment/Plan:     21 year old with history of diabetes comes in with intractable headache secondary to complete opacification of ethmoid sinuses admitted to sinusitis    Ethmoid sinusitis with complete opacification-status post endoscopic sinus surgery with right subtotal ethmoidectomy and frontal sinusotomy and right sphenoidectomy with removal of content and right inferior turbinate outfracture and reduction right nasopharyngeal biopsy.  MRI face: Complete opacification of the right sphenoid sinus with proteinaceous nonenhancing contents  Started Unasyn 3 g IV every 6->switch to augmentin x 3 weeks awaiting culture results for discharge antibiotic reconciliation.  Change dexamethasone to prednisone taper  Discussed with ENT   S/p OR on 11/22    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.  Hgba1c: 9%  Normally takes Lantus 10 units twice a day without any short acting, but after starting steroids increased to 3 times a day  Contin Lantus 25 units twice a day  Start Humalog 5 units 3 times daily; increase to high sliding scale  Lower prednisone to 5 mg daily for additional 3 days only  Will need to discharge on higher dose of Lantus 20 units twice a day, does not use Humalog.  States that his PCP was going to start him on humalog in the near future. Will leave that up to PCP since will need a close follow up  Start metformin 500 mg bid    Intractable headache secondary to sinusitis  Continue Fioricet as needed we will also add Toradol as needed    Disposition: Father uncomfortable with him coming home.  Will keep overnight and discharge in a.m.         Central Line/Foley Catheter/PICC line status: None  Code Status: Full Code  Disposition:Planning to discharge patient to home  Type of  Admission:Inpatient  Hospital Problems:     Active Hospital Problems    Diagnosis    Acute intractable headache, unspecified headache type     Subjective:   Chief Complaint:  Headache and Nausea    07/12/22   ROS    Patient with intermittent frontal headache, sore throat, inability to swallow food due to sore throat, persistent generalized weakness  Objective:     Vitals:    07/12/22 0047 07/12/22 0524 07/12/22 0757 07/12/22 1150   BP: 143/72 131/66 123/69 134/76   Pulse: 62 (!) 58 (!) 59 (!) 58   Resp: 16 17 12 13    Temp: 98.4 F (36.9 C) 98.1 F (36.7 C) 97.5 F (36.4 C) 98.2 F (36.8 C)   TempSrc: Oral Oral Oral Oral   SpO2: 95% 96% 98% 96%   Weight:       Height:         Physical Exam:   Physical Exam    General no apparent distress  Chest clear to auscultation  Pharyngeal erythema however no exudate upon examination  Heart regular rate rhythm  Abdomen BS present soft  Extremity no edema  Current Medications      Scheduled Meds: PRN Meds:    ampicillin-sulbactam, 3 g, Intravenous, Q6H  fluticasone, 1 spray, Each Nare, BID  insulin glargine, 25 Units, Subcutaneous, Q12H SCH  insulin lispro, 1-6 Units, Subcutaneous, QHS  insulin lispro, 2-10 Units, Subcutaneous, TID AC  insulin lispro, 5 Units, Subcutaneous, TID AC  lactobacillus/streptococcus, 1 capsule, Oral, Daily  predniSONE, 5 mg, Oral, QAM W/BREAKFAST  saline, 2 spray, Each Nare, TID        Continuous Infusions:     acetaminophen, 650 mg, Q4H PRN  benzocaine-menthol, 1 lozenge, Q1H PRN  benzonatate, 100 mg, TID PRN  butalbital-acetaminophen-caffeine, 1 tablet, Q4H PRN  artificial tears (REFRESH PLUS), 1 drop, TID PRN  dextrose, 15 g of glucose, PRN   Or  dextrose, 12.5 g, PRN   Or  dextrose, 12.5 g, PRN   Or  glucagon (rDNA), 1 mg, PRN  ibuprofen, 600 mg, Q6H PRN  magnesium sulfate, 1 g, PRN  melatonin, 3 mg, QHS PRN  naloxone, 0.2 mg, PRN  potassium & sodium phosphates, 2 packet, PRN  potassium chloride, 0-40 mEq, PRN   And  potassium chloride, 10 mEq,  PRN  saline, 2 spray, Q4H PRN          Results of Labs/imaging   Labs for the last 24 hours have been reviewed:   Coagulation Profile:   Recent Labs   Lab 07/05/22  1802   PT 12.2   PT INR 1.1         CBC review:   Recent Labs   Lab 07/11/22  0653 07/10/22  0738 07/09/22  0640 07/08/22  0647 07/07/22  1129   WBC 13.86* 12.74* 15.88* 13.57* 13.49*   Hgb 15.4 15.4 15.3 16.0 15.8   Hematocrit 43.7 45.4 43.7 45.6 45.1   Platelets 231 240 251 259 253   MCV 80.2 81.4 79.9 79.6 79.3   RDW 12 12 12 12 13    Neutrophils 60.6 56.6 76.2 85.0 68.9   Neutrophils Absolute 8.41* 7.21* 12.10* 11.53* 9.30*   Lymphocytes Automated 28.1 33.5 15.7 10.3 22.7   Eosinophils Automated 2.6 2.0 0.2 0.0 0.7   Immature Granulocytes 0.9 0.8 0.9 0.8 0.4   Immature Granulocytes Absolute 0.12* 0.10* 0.15* 0.11* 0.05       Chem Review:  Recent Labs   Lab 07/09/22  0640 07/08/22  0647 07/07/22  1129 07/05/22  1802   Sodium 136 135 135 138   Potassium 4.3 4.4 4.0 4.2   Chloride 104 102 99 104   CO2 26 25 27 26    BUN 22.0 15.0 14.0 13.0   Creatinine 1.3 1.1 1.3 1.2   Glucose 350* 311* 228* 271*   Calcium 9.1 9.4 9.6 9.3   Magnesium  --   --  1.6  --    Bilirubin, Total  --   --  0.6  --    AST (SGOT)  --   --  12  --    ALT  --   --  26  --    Alkaline Phosphatase  --   --  65  --        Results       Procedure Component Value Units Date/Time    Glucose Whole Blood - POCT [[id number]]  (Abnormal) Collected: 07/12/22 1148     Updated: 07/12/22 1153     Whole Blood Glucose POCT 142 mg/dL     Fungal Culture & Smear [[id number]] Collected: 07/11/22 1400    Specimen: Other from Tissue Updated: 07/12/22 1054    Narrative:      ORDER#: [id number]                                    ORDERED BY: AZADARMAKI, ROY  SOURCE: Incision Right nasopharynx  COLLECTED:  07/11/22 14:00  ANTIBIOTICS AT COLL.:                                RECEIVED :  07/11/22 19:09  Stain, Fungal                              FINAL       07/12/22 10:54  07/12/22   No Fungal or  Yeast Elements Seen  Culture Fungus                             PENDING      Fungal Culture & Smear [[id number]] Collected: 07/11/22 1330    Specimen: Other from Tissue Updated: 07/12/22 1054    Narrative:      ORDER#: [id number]                                    ORDERED BY: AZADARMAKI, ROY  SOURCE: Incision Rigth phenoid sinus                 COLLECTED:  07/11/22 13:30  ANTIBIOTICS AT COLL.:                                RECEIVED :  07/11/22 19:13  Stain, Fungal                              FINAL       07/12/22 10:54  07/12/22   No Fungal or Yeast Elements Seen  Culture Fungus                             PENDING      AFB Culture & Smear [[id number]] Collected: 07/11/22 1400    Specimen: Sputum from Tissue, Deep Updated: 07/12/22 0841    Narrative:      ORDER#: [id number]                                    ORDERED BY: AZADARMAKI, ROY  SOURCE: Tissue, Deep Right nasopharynx               COLLECTED:  07/11/22 14:00  ANTIBIOTICS AT COLL.:                                RECEIVED :  07/11/22 19:10  Stain, Acid Fast                           FINAL       07/12/22 08:41  07/12/22   No Acid Fast Bacillus Seen  Culture Acid Fast Bacillus (AFB)           PENDING      AFB Culture & Smear [[id number]] Collected: 07/11/22 1330    Specimen: Sputum from Tissue, Deep Updated: 07/12/22 0841    Narrative:      ORDER#: [id number]  ORDERED BY: AZADARMAKI, ROY  SOURCE: Tissue, Deep Right phenoid sinus             COLLECTED:  07/11/22 13:30  ANTIBIOTICS AT COLL.:                                RECEIVED :  07/11/22 19:12  Stain, Acid Fast                           FINAL       07/12/22 08:41  07/12/22   No Acid Fast Bacillus Seen  Culture Acid Fast Bacillus (AFB)           PENDING      Glucose Whole Blood - POCT [[id number]]  (Abnormal) Collected: 07/12/22 0755     Updated: 07/12/22 0803     Whole Blood Glucose POCT 182 mg/dL     Glucose Whole Blood - POCT [[id number]]  (Abnormal) Collected: 07/12/22 0526      Updated: 07/12/22 0532     Whole Blood Glucose POCT 215 mg/dL     Culture + Gram Stain,Aerobic, Wound [[id number]] Collected: 07/11/22 1400    Specimen: Wound from Incision Updated: 07/11/22 2231    Narrative:      eswab  ORDER#: [id number]                                    ORDERED BY: AZADARMAKI, ROY  SOURCE: Incision Right nasopharynx                   COLLECTED:  07/11/22 14:00  ANTIBIOTICS AT COLL.:                                RECEIVED :  07/11/22 19:08  ORDER ENTRY COMMENTS:  eswab  Stain, Gram                                FINAL       07/11/22 22:31  07/11/22   Few WBCs             No Squamous epithelial cells seen             No organisms seen  Culture and Gram Stain, Aerobic, Wound     PENDING      Glucose Whole Blood - POCT [[id number]]  (Abnormal) Collected: 07/11/22 2129     Updated: 07/11/22 2133     Whole Blood Glucose POCT 260 mg/dL     Culture + Gram Stain,Aerobic, Wound [[id number]] Collected: 07/11/22 1330    Specimen: Wound from Incision Updated: 07/11/22 2109    Narrative:      eswab  ORDER#: [id number]                                    ORDERED BY: AZADARMAKI, ROY  SOURCE: Incision Right phenoid sinus                 COLLECTED:  07/11/22 13:30  ANTIBIOTICS AT COLL.:  RECEIVED :  07/11/22 19:12  ORDER ENTRY COMMENTS:  eswab  Stain, Gram                                FINAL       07/11/22 21:09  07/11/22   Moderate WBCs             No Squamous epithelial cells seen             No organisms seen  Culture and Gram Stain, Aerobic, Wound     PENDING      Culture, Anaerobic Bacteria [[id number]] Collected: 07/11/22 1330    Specimen: Other from Tissue Updated: 07/11/22 1912    Culture, Anaerobic Bacteria [[id number]] Collected: 07/11/22 1400    Specimen: Other from Tissue Updated: 07/11/22 1908    Culture + Gram Stain,Aerobic, CSF [[id number]] Collected: 07/07/22 1259    Specimen: Cerebrospinal Fluid from CSF (Lumbar Puncture Spinal Fluid) Updated: 07/11/22 1645    Narrative:       ORDER#: [id number]                                    ORDERED BY: MEHTA, SAMEER  SOURCE: CSF (Lumbar Puncture Spinal Fluid)           COLLECTED:  07/07/22 12:59  ANTIBIOTICS AT COLL.:                                RECEIVED :  07/07/22 18:26  Stain, Gram                                FINAL       07/07/22 20:10  07/07/22   Stain performed on Cytospin (concentrated) specimen             Few WBCs             No organisms seen  Culture and Gram Stain, Aerobic, CSF       FINAL       07/11/22 16:45  07/11/22   No Growth      Glucose Whole Blood - POCT [[id number]]  (Abnormal) Collected: 07/11/22 1635     Updated: 07/11/22 1642     Whole Blood Glucose POCT 247 mg/dL     Glucose Whole Blood - POCT [[id number]]  (Abnormal) Collected: 07/11/22 1440     Updated: 07/11/22 1445     Whole Blood Glucose POCT 184 mg/dL           Radiology reports have been reviewed:  Radiology Results (24 Hour)       ** No results found for the last 24 hours. **          MRI Orbit Face Neck W WO Contrast    Result Date: 07/08/2022  HISTORY: Right-sided sinonasal disease COMPARISON: CT sinus 07/07/2022 TECHNIQUE: MRI of the face performed on a 3.0 Tesla scanner without and with intravenous contrast. CONTRAST: gadoterate meglumine (CLARISCAN) 10 MMOL/20ML injection 28 mL FINDINGS: The right sphenoid sinus is completely opacified by mildly intrinsically T1 hyperintense, T2 hypointense, nonenhancing material. This may represent a mucocele versus chronically inspissated secretions. There is no underlying enhancing mass lesion. No intracranial or sellar extension. There is mild scattered benign appearing mucosal thickening elsewhere. The  visualized portions of the brain and orbits are unremarkable.     Complete opacification of the right sphenoid sinus with proteinaceous nonenhancing contents. Findings may represent mucocele versus chronically inspissated secretions. There is no enhancing mass lesion. No intracranial extension of disease. Brenton Grills,  MD 07/08/2022 8:03 AM    CT Sinus Facial Bones with Contrast    Result Date: 07/07/2022  HISTORY: Sinusitis evaluation COMPARISON: A head CT from the same day, dictated separately. TECHNIQUE: CT of the sinuses performed with intravenous contrast. Multiplanar reformatted images were created and reviewed. The following dose reduction techniques were utilized: automated exposure control and/or adjustment of the mA and/or KV according to patient size, and the use of an iterative reconstruction technique. CONTRAST: iohexol (OMNIPAQUE) 350 MG/ML injection 100 mL FINDINGS: There is severe opacification of the mid and posterior right ethmoid air cells and right sphenoid sinus with occlusion of the right sphenoethmoidal recess. There is involvement of the right nasal cavity most pronounced posteriorly. There is increased density within the secretions. There is bowing of the right lamina papyracea without obvious orbital extension of disease. There is mild mucosal thickening of the left ethmoid air cells and left sphenoid sinus with occlusion of the left sphenoethmoidal recess. There is mild mucosal thickening of the maxillary sinuses with a potential retention cyst in the right maxillary sinus. There is occlusion of the infundibula. There are secretions in the upper left nasal cavity. There is leftward nasal septal deviation. The retromaxillary fat planes and pterygopalatine fissures are intact. The adenoids and palatine tonsils are prominent. There is no abnormal enhancement within the soft tissues. Limited imaging of the brain is unremarkable. The mastoid air cells and middle ear cavities are clear. There is an impacted tooth with surrounding lucency situated between the right maxillary cuspid and first bicuspid teeth.     1.There is severe mid-posterior right ethmoid air cell and right sphenoid sinus opacification. There is bowing of the right lamina papyracea. There is involvement of the right nasal cavity. There is  increased attenuation within the secretions. Diagnostic possibilities include but are not limited to a mucocele and underlying paranasal sinus disease, severe paranasal sinus disease with inspissated secretions, fungal disease or polyposis. 2.Mild mucosal thickening within the left ethmoid air cells, left sphenoid sinus and both maxillary sinuses. 3.Prominent adenoids and palatine tonsils. Correlation with the patient's immune status may be of benefit. There is an impacted tooth with surrounding lucency situated between the right maxillary cuspid and first bicuspid teeth. Otho Ket, DO 07/07/2022 3:10 PM    CT Head WO Contrast    Result Date: 07/07/2022  HISTORY: persistent headaches COMPARISON: CT head 07/05/2022 TECHNIQUE: CT of the head performed without intravenous contrast. The following dose reduction techniques were utilized: automated exposure control and/or adjustment of the mA and/or KV according to patient size, and the use of an iterative reconstruction technique. CONTRAST: None. FINDINGS: Cerebral ventricles, cortical sulci, basilar cisterns are within normal limits. Gray-white differentiation is within normal limits. No acute intracranial hemorrhage or mass effect identified. Calvarium appears intact. There is again prominent right sphenoid sinus and posterior right ethmoid air cell disease mildly expanded contours. High attenuation material within these sinuses is likely on the basis of inspissated secretions.      1.  No acute intracranial hemorrhage, mass effect, or hydrocephalus. 2.  Prominent right sphenoid sinus and posterior right ethmoid air cell disease, 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 Melody Haver, MD 07/07/2022 11:51 AM    CT Angiogram Head  Result Date: 07/05/2022  HISTORY: Headache x3 weeks. Reportedly 10 out of 10. No improvement despite treatment. NML neuro exam. No prior hx of aneurysm. COMPARISON: None available. Correlation made with unenhanced CT of the head, performed same day  reported separately. TECHNIQUE: CT angiogram of the head performed with intravenous contrast. Multiplanar reformatted and 3D maximum intensity projection (MIP) images were created and reviewed. The following dose reduction techniques were utilized: automated exposure control and/or adjustment of the mA and/or KV according to patient size, and the use of an iterative reconstruction technique. CONTRAST: iohexol (OMNIPAQUE) 350 MG/ML injection 80 mL FINDINGS: The intracranial right internal carotid artery is patent.  The intracranial left internal carotid artery is patent.  Both the intracranial vertebral arteries are patent.  The basilar artery is normal in caliber.  The proximal visualized portions of the anterior cerebral, middle cerebral and posterior cerebral arteries are patent. No aneurysm or AVM is demonstrated.     1.Wide patency of the intracranial arterial circulation. 2.No aneurysm or AVM detected. Waynard Edwards, MD 07/05/2022 6:36 PM    CT Head without Contrast    Result Date: 07/05/2022  HISTORY: Persistent headaches for 3 weeks. COMPARISON: Comparison exam dated 07/01/2022. Correlation made with intracranial CT angiography, performed same day reported separately. TECHNIQUE: CT of the head performed without intravenous contrast. The following dose reduction techniques were utilized: automated exposure control and/or adjustment of the mA and/or KV according to patient size, and the use of an iterative reconstruction technique. CONTRAST: None. FINDINGS: The ventricles are normal in configuration. No intracranial hemorrhage, mass-effect or midline shift is present. No extra-axial fluid collections are seen. There is no CT evidence of an acute cortical infarct. The parenchyma is unremarkable in attenuation. The calvarium is intact. Redemonstrated is complete opacification of the posterior right-sided ethmoid air cells with near complete opacification of the right sphenoid sinus by membrane thickening and  fluid which exhibits heterogeneous hyperattenuation. This is nonspecific and can be seen in the setting of inspissated, protein-rich secretions and/or superimposed fungal infection. This irregular soft tissue extends into the posterior-superior aspect of the right nasal cavity near the nasal choana. There is no abnormal distention or hyperattenuation of either cavernous sinus. The balance of the visualized paranasal sinuses, middle ear cavities and mastoid air cells are well-pneumatized and clear. The orbital contents are normal. Adenoidal hypertrophy is present.      1.No CT evidence of acute intracranial abnormality. 2.Severe right sphenoid and posterior ethmoid sinusitis for which otolaryngology follow-up consultation and evaluation is again advised. An obstructive lesion at the right sphenoethmoidal recess is not excluded and endoscopic or surgical evaluation should be considered. Waynard Edwards, MD 07/05/2022 6:32 PM    CT Head WO Contrast    Result Date: 07/01/2022  HISTORY: Headache, chronic, new features or increased frequency COMPARISON: None available. TECHNIQUE: CT of the head performed without intravenous contrast. The following dose reduction techniques were utilized: automated exposure control and/or adjustment of the mA and/or KV according to patient size, and the use of an iterative reconstruction technique. CONTRAST: None. FINDINGS: The ventricles are normal in configuration. No intracranial hemorrhage, mass-effect or midline shift is present. No extra-axial fluid collections are seen. There is no CT evidence of an acute cortical infarct. The parenchyma is unremarkable in attenuation. The calvarium is intact. There is complete opacification of the posterior right-sided ethmoid air cells with near complete opacification of the right sphenoid sinus by membrane thickening and fluid which exhibits heterogeneous hyperattenuation. This is nonspecific and can be seen  in the setting of inspissated,  protein-rich secretions and/or superimposed fungal infection. This irregular soft tissue extends into the posterior-superior aspect of the right nasal cavity near the nasal choana. The balance of the visualized paranasal sinuses, middle ear cavities and mastoid air cells are well-pneumatized and clear. The orbital contents are normal. Adenoidal hypertrophy is present.      1.No CT evidence of acute intracranial abnormality. 2.Right sphenoid and posterior ethmoid sinusitis for which otolaryngology follow-up consultation is advised. An obstructive lesion at the right sphenoethmoidal recess is not excluded. Waynard Edwards, MD 07/01/2022 9:04 PM    Pathology:   Specimens (From admission, onward)      None          Last EKG Result       None          Hospitalist   Signed by:   Judson Roch, MD  07/12/2022 2:21 PM  Time spent in evaluating and managing the care of the patient including face-to-face and non-face-to-face: 35 mintues    *This note was generated by the Epic EMR system/ Dragon speech recognition and may contain inherent errors or omissions not intended by the user. Grammatical errors, random word insertions, deletions, pronoun errors and incomplete sentences are occasional consequences of this technology due to software limitations. Not all errors are caught or corrected. If there are questions or concerns about the content of this note or information contained within the body of this dictation they should be addressed directly with the author for clarification

## 2022-07-12 NOTE — Progress Notes (Signed)
Neuro: A/Ox4  Respiratory: RA  Integumentary: CDI  GI/GU: voids independently  Diet: CARB CONSISTENT- accuchecks ACHS w/ SSI  Activity: ambulates independently  LDA:     Patient Lines/Drains/Airways Status       Active Lines, Drains and Airways       Name Placement date Placement time Site Days    Peripheral IV 07/09/22 20 G Standard Left;Posterior Forearm 07/09/22  0502  Forearm  3                    Shift Events: per MD pending cultures for appropriate home antibiotics. Pt utilizing pRN cepacol for throat pain. BG covered w/ SSI, IV antibiotics adminsitered. No distress noted    Vitals:    07/12/22 1620   BP: 123/70   Pulse: 64   Resp: 13   Temp: 97.3 F (36.3 C)   SpO2: 95%

## 2022-07-12 NOTE — Plan of Care (Signed)
Problem: Pain interferes with ability to perform ADL  Goal: Pain at adequate level as identified by patient  Outcome: Progressing  Flowsheets (Taken 07/12/2022 0800)  Pain at adequate level as identified by patient: Identify patient comfort function goal     Problem: Side Effects from Pain Analgesia  Goal: Patient will experience minimal side effects of analgesic therapy  Outcome: Progressing  Flowsheets (Taken 07/12/2022 0800)  Patient will experience minimal side effects of analgesic therapy: Monitor/assess patient's respiratory status (RR depth, effort, breath sounds)     Problem: Safety  Goal: Patient will be free from injury during hospitalization  Outcome: Progressing  Flowsheets (Taken 07/11/2022 1513 by Arlana Lindau, RN)  Patient will be free from injury during hospitalization:   Assess patient's risk for falls and implement fall prevention plan of care per policy   Use appropriate transfer methods   Ensure appropriate safety devices are available at the bedside   Provide and maintain safe environment   Hourly rounding   Include patient/ family/ care giver in decisions related to safety  Goal: Patient will be free from infection during hospitalization  Outcome: Progressing  Flowsheets (Taken 07/11/2022 1513 by Arlana Lindau, RN)  Free from Infection during hospitalization:   Assess and monitor for signs and symptoms of infection   Monitor lab/diagnostic results   Encourage patient and family to use good hand hygiene technique     Problem: Psychosocial and Spiritual Needs  Goal: Demonstrates ability to cope with hospitalization/illness  Outcome: Progressing  Flowsheets (Taken 07/08/2022 0127 by Georgeanna Harrison, RN)  Demonstrates ability to cope with hospitalizations/illness:   Encourage patient to set small goals for self   Assist patient to identify own strengths and abilities   Provide quiet environment   Encourage verbalization of feelings/concerns/expectations

## 2022-07-12 NOTE — Progress Notes (Signed)
Patient had an uneventful night. Does c/o of a sore throat and is taking lozenges prn.. No resp distress noted this shift. States his headache is much better.

## 2022-07-13 ENCOUNTER — Encounter: Payer: Self-pay | Admitting: Otolaryngology

## 2022-07-13 LAB — GLUCOSE WHOLE BLOOD - POCT
Whole Blood Glucose POCT: 143 mg/dL — ABNORMAL HIGH (ref 70–100)
Whole Blood Glucose POCT: 204 mg/dL — ABNORMAL HIGH (ref 70–100)

## 2022-07-13 MED ORDER — IBUPROFEN 600 MG PO TABS
600.0000 mg | ORAL_TABLET | Freq: Three times a day (TID) | ORAL | 0 refills | Status: DC | PRN
Start: 2022-07-13 — End: 2022-09-04

## 2022-07-13 MED ORDER — FLUCONAZOLE 100 MG PO TABS
100.0000 mg | ORAL_TABLET | ORAL | Status: DC
Start: 2022-07-13 — End: 2022-07-13
  Administered 2022-07-13: 100 mg via ORAL

## 2022-07-13 MED ORDER — FLUCONAZOLE 100 MG PO TABS
100.0000 mg | ORAL_TABLET | ORAL | 0 refills | Status: AC
Start: 2022-07-13 — End: 2022-08-03

## 2022-07-13 MED ORDER — AMOXICILLIN-POT CLAVULANATE 875-125 MG PO TABS
1.0000 | ORAL_TABLET | Freq: Two times a day (BID) | ORAL | 0 refills | Status: AC
Start: 2022-07-13 — End: 2022-08-03

## 2022-07-13 NOTE — Plan of Care (Signed)
Problem: Pain interferes with ability to perform ADL  Goal: Pain at adequate level as identified by patient  Outcome: Progressing     Problem: Safety  Goal: Patient will be free from injury during hospitalization  Outcome: Progressing  Goal: Patient will be free from infection during hospitalization  Outcome: Progressing     Problem: Pain  Goal: Pain at adequate level as identified by patient  Outcome: Progressing

## 2022-07-13 NOTE — Discharge Summary (Signed)
Patient discharge to home.Reviewed discharge summary and instructions with patient and patient's family at bedside. Verbally understand.PIV removed catheter intact. Pt belongings collected and accounted for.  Pt escorted out to the [facility] entrance. Family will transport patient home. Patient has no questions or concerns at this time.

## 2022-07-13 NOTE — UM Notes (Signed)
Patient Name: [name]   Patient DOB: 2001/08/04  [facility]      Review for DOS: 07/12/22       DIAGNOSIS    ICD-10-CM    1. Acute intractable headache, unspecified headache type  R51.9       2. Acute sphenoidal sinusitis, recurrence not specified  J01.30       3. Acute ethmoidal sinusitis, recurrence not specified  J01.20 Activity as tolerated      4. Acute sinusitis, recurrence not specified, unspecified location  J01.90 Surgical Pathology     Surgical Pathology     Activity as tolerated          Unit: med/surg      Vitals:     07/12/22 0047 07/12/22 0524 07/12/22 0757 07/12/22 1150   BP: 143/72 131/66 123/69 134/76   Pulse: 62 (!) 58 (!) 59 (!) 58   Resp: 16 17 12 13    Temp: 98.4 F (36.9 C) 98.1 F (36.7 C) 97.5 F (36.4 C) 98.2 F (36.8 C)   TempSrc: Oral Oral Oral Oral   SpO2: 95% 96% 98% 96%     Oxygen: room air  Pain: 2/10 mild pain to throat    Abnormal Labs:     07/12/22 05:26 07/12/22 07:55 07/12/22 11:48 07/12/22 16:18 07/12/22 20:50   Whole Blood Glucose POCT 215 (H) 182 (H) 142 (H) 264 (H) 229 (H)       Medicine Assessment and Plan:  21 year old with history of diabetes comes in with intractable headache secondary to complete opacification of ethmoid sinuses admitted to sinusitis     Ethmoid sinusitis with complete opacification-status post endoscopic sinus surgery with right subtotal ethmoidectomy and frontal sinusotomy and right sphenoidectomy with removal of content and right inferior turbinate outfracture and reduction right nasopharyngeal biopsy.  MRI face: Complete opacification of the right sphenoid sinus with proteinaceous nonenhancing contents  Started Unasyn 3 g IV every 6->switch to augmentin x 3 weeks awaiting culture results for discharge antibiotic reconciliation.  Change dexamethasone to prednisone taper  Discussed with ENT   S/p OR on 11/22     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.  Hgba1c: 9%  Normally takes Lantus 10 units twice a day without any short acting, but  after starting steroids increased to 3 times a day  Contin Lantus 25 units twice a day  Start Humalog 5 units 3 times daily; increase to high sliding scale  Lower prednisone to 5 mg daily for additional 3 days only  Will need to discharge on higher dose of Lantus 20 units twice a day, does not use Humalog.  States that his PCP was going to start him on humalog in the near future. Will leave that up to PCP since will need a close follow up  Start metformin 500 mg bid     Intractable headache secondary to sinusitis  Continue Fioricet as needed we will also add Toradol as needed      This clinical review is based on/compiled from documentation provided by the treatment team within the patient's medical record.    Payor: OUT OF STATE BLUE CROSS / Plan: BCBS OUT OF STATE PPO / Product Type: BCBS /     Cane Dubray, RN, BSN  Utilization Review   Phone: [phone] (voicemail only)  Fax: [phone]  [email or web address] [email or web address]

## 2022-07-13 NOTE — Anesthesia Postprocedure Evaluation (Signed)
Anesthesia Post Evaluation    Patient: [name]    Procedure(s):  ENDOSCOPIC SINUS SURGERY (FESS) Right subtotal ethmoidectomy with frontal sinusotomy, Right sphenoidectomy with removal of content, right inferior turbinate outfracture and reduction, Right nasopharyngeal biopsy    Anesthesia type: general    Last Vitals:   Vitals Value Taken Time   BP 143/74 07/11/22 1550   Temp 36.6 C (97.9 F) 07/11/22 1432   Pulse 54 07/11/22 1550   Resp 17 07/11/22 1550   SpO2 95 % 07/11/22 1550                 Anesthesia Post Evaluation:     Patient Evaluated: floor  Patient Participation: complete - patient participated  Level of Consciousness: awake and alert    Pain Management: adequate    Airway Patency: patent        Anesthetic complications: No      PONV Status: none    Cardiovascular status: acceptable  Respiratory status: acceptable  Hydration status: acceptable          Signed by: Kym Groom, MD, 07/13/2022 8:45 AM

## 2022-07-13 NOTE — Plan of Care (Signed)
To whom it may concern,    Mr. Amil Bouwman was admitted to [facility] from 07/07/2022 to 07/13/2022.  Patient may return to work on July 16, 2022.      Judson Roch, M.D.    [facility]  [address]  [place], [place] [address]  [phone]

## 2022-07-15 NOTE — UM Notes (Signed)
[place] Utilization Review   Tax ID [id number]  Main UR#: [phone]    Request for final authorization for this admission:  [email or web address] or fax to [phone]    Admit Date:  07/07/2022 10:15 AM   IP Date:  Discharge date - 07/13/2022  3:30 PM  LOS: 6      Name:   Age:  Male/Male:   [name],[name]  21 y.o.  male       DOB:      Mar 16, 2001       Primary Insurance ID#   Payor: OUT OF STATE BLUE CROSS / Plan: BCBS OUT OF STATE PPO / Product Type: BCBS /    [id number] - Psychologist, sport and exercise)  Auth number :  [id number]       Home Phone #   [phone]       Address:    [address]  [place] [address]       [place]     Admission Diagnosis:  Acute sphenoidal sinusitis, recurrence not specified [J01.30]  Acute ethmoidal sinusitis, recurrence not specified [J01.20]  Acute intractable headache, unspecified headache type [R51.9]    Lists the present on admission hospital problems  Present on Admission:   Acute intractable headache, unspecified headache type

## 2022-07-17 NOTE — Discharge Summary (Signed)
[facility] HOSPITALIST Discharge Summary  Patient Info:   Date/Time: 07/17/2022 / 11:51 AM   Admit Date:07/07/2022  Patient Name:[name]   ZOX:[id number]   PCP: Marisa Sprinkles, MD  Attending Physician:No att. providers found    Hospital Course:   Please see H&P for complete details of HPI and ROS. The patient was admitted   to [facility] and has been taken care as mentioned below.    21 year old with history of diabetes comes in with intractable headache secondary to complete opacification of ethmoid sinuses admitted to sinusitis     Ethmoid sinusitis with complete opacification-status post endoscopic sinus surgery with right subtotal ethmoidectomy and frontal sinusotomy and right sphenoidectomy with removal of content and right inferior turbinate outfracture and reduction right nasopharyngeal biopsy.  MRI face: Complete opacification of the right sphenoid sinus with proteinaceous nonenhancing contents  Started Unasyn 3 g IV every 6->switch to augmentin x 3 weeks awaiting culture results for discharge antibiotic reconciliation.  Change dexamethasone to prednisone taper  Discussed with ENT   S/p OR on 11/22     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.  Hgba1c: 9%  Normally takes Lantus 10 units twice a day without any short acting, but after starting steroids increased to 3 times a day  Contin Lantus 25 units twice a day  Start Humalog 5 units 3 times daily; increase to high sliding scale  Lower prednisone to 5 mg daily for additional 3 days only  Will need to discharge on higher dose of Lantus 20 units twice a day, does not use Humalog.  States that his PCP was going to start him on humalog in the near future. Will leave that up to PCP since will need a close follow up  Start metformin 500 mg bid     Intractable headache secondary to sinusitis  Continue Fioricet as needed we will also add Toradol as needed              Patient has BMI=Body mass index is 37.67 kg/m.  Diagnosis: Obesity based on BMI criteria         Disposition:home  Condition at Discharge and Prognosis: stable  Admission Date:07/07/2022  Discharge Date: 07/13/2022  Type of Admission:Inpatient   Code Status: Prior  Subjective:   Patient with no sore throat today, tolerating p.o. diet, no difficulty swallowing food, no nausea, no vomiting, no nasal congestion, no fever, no chills, no cough.    Chief Complaint:  Headache and Nausea      Objective:     Vitals:    07/12/22 2324 07/13/22 0420 07/13/22 0834 07/13/22 1153   BP: 126/69 117/63 131/73 135/75   Pulse: 84 79 73 68   Resp: 16 18 14 15    Temp: 98.4 F (36.9 C) 98.4 F (36.9 C) 98.4 F (36.9 C) 98.6 F (37 C)   TempSrc: Oral Oral Oral Oral   SpO2: 100% 98% 98% 98%   Weight:       Height:         Physical Exam:   No acute respiratory distress  No facial pain or tenderness upon palpation over frontal sinuses around nasal cavity.  S1-S2 normal, no murmurs rubs gallops audible upon auscultation  Lungs clear to auscultation bilaterally  Bowel sounds present in all 4 quadrants and epigastrium, soft, nontender, nondistended  No pretibial edema    Clinical Presentation:     History of Presenting Illness: Please refer to HPI in the Detailed H&P    Discharge Medications:  Discharge Medications:      Discharge Medication List        Taking      amoxicillin-clavulanate 875-125 MG per tablet  Dose: 1 tablet  Commonly known as: AUGMENTIN  Take 1 tablet by mouth 2 (two) times daily for 21 days     butalbital-acetaminophen-caffeine 50-325-40 MG per tablet  Dose: 1 tablet  Commonly known as: FIORICET  Take 1 tablet by mouth every 4 (four) hours as needed for Headaches     fluconazole 100 MG tablet  Dose: 100 mg  Commonly known as: DIFLUCAN  Take 1 tablet (100 mg) by mouth every 24 hours for 21 days     fluticasone 50 MCG/ACT nasal spray  Dose: 1 spray  Commonly known as: FLONASE  1 spray by Nasal route 2 (two) times daily     ibuprofen 600 MG tablet  Dose: 600 mg  Commonly known as: ADVIL  Take 1 tablet (600 mg) by  mouth every 8 (eight) hours as needed for Pain     insulin glargine 100 UNIT/ML pen-injector  Dose: 20 Units  What changed:   how much to take  when to take this  Commonly known as: BASAGLAR/LANTUS  Inject 20 Units into the skin 2 (two) times daily     lactobacillus/streptococcus Caps  Dose: 1 capsule  Take 1 capsule by mouth daily for 21 days     metFORMIN 500 MG tablet  Dose: 500 mg  Commonly known as: GLUCOPHAGE  Take 1 tablet (500 mg) by mouth 2 (two) times daily with meals START with 500 mg daily for 1 week then twice a day     rizatriptan 5 MG tablet  Dose: 10 mg  Commonly known as: MAXALT  Take 2 tablets (10 mg) by mouth as needed for Migraine May repeat in 2 hours if needed            STOP taking these medications      methylPREDNISolone 4 MG tablet  Commonly known as: MEDROL DOSEPAK                 predniSONE 5 MG tablet  Dose: 5 mg  Commonly known as: DELTASONE  Take 1 tablet (5 mg) by mouth every morning with breakfast for 3 days  Ask about: Should I take this medication?              Follow up recommendations:   Follow up:    Follow-up Information       Francis Gaines, MD Follow up in 2 week(s).    Specialty: Otorhinolaryngology  Contact information:  [address]  [place] [address]  [phone]               Pcp, None, MD .                              Results of Labs/imaging:   Labs have been reviewed:   Coagulation Profile:       CBC review:   Recent Labs   Lab 07/11/22  0653   WBC 13.86*   Hgb 15.4   Hematocrit 43.7   Platelets 231   MCV 80.2   RDW 12   Neutrophils 60.6   Neutrophils Absolute 8.41*   Lymphocytes Automated 28.1   Eosinophils Automated 2.6   Immature Granulocytes 0.9   Immature Granulocytes Absolute 0.12*     Chem Review:    Results  Procedure Component Value Units Date/Time    Glucose Whole Blood - POCT [[id number]]  (Abnormal) Collected: 07/13/22 1212     Updated: 07/13/22 1222     Whole Blood Glucose POCT 204 mg/dL     Glucose Whole Blood - POCT [[id number]]   (Abnormal) Collected: 07/13/22 0833     Updated: 07/13/22 0839     Whole Blood Glucose POCT 143 mg/dL     Glucose Whole Blood - POCT [[id number]]  (Abnormal) Collected: 07/12/22 2050     Updated: 07/12/22 2053     Whole Blood Glucose POCT 229 mg/dL     Glucose Whole Blood - POCT [[id number]]  (Abnormal) Collected: 07/12/22 1618     Updated: 07/12/22 1624     Whole Blood Glucose POCT 264 mg/dL           Radiology reports have been reviewed:  Radiology Results (24 Hour)       ** No results found for the last 24 hours. **          MRI Orbit Face Neck W WO Contrast    Result Date: 07/08/2022  HISTORY: Right-sided sinonasal disease COMPARISON: CT sinus 07/07/2022 TECHNIQUE: MRI of the face performed on a 3.0 Tesla scanner without and with intravenous contrast. CONTRAST: gadoterate meglumine (CLARISCAN) 10 MMOL/20ML injection 28 mL FINDINGS: The right sphenoid sinus is completely opacified by mildly intrinsically T1 hyperintense, T2 hypointense, nonenhancing material. This may represent a mucocele versus chronically inspissated secretions. There is no underlying enhancing mass lesion. No intracranial or sellar extension. There is mild scattered benign appearing mucosal thickening elsewhere. The visualized portions of the brain and orbits are unremarkable.     Complete opacification of the right sphenoid sinus with proteinaceous nonenhancing contents. Findings may represent mucocele versus chronically inspissated secretions. There is no enhancing mass lesion. No intracranial extension of disease. Brenton Grills, MD 07/08/2022 8:03 AM    CT Sinus Facial Bones with Contrast    Result Date: 07/07/2022  HISTORY: Sinusitis evaluation COMPARISON: A head CT from the same day, dictated separately. TECHNIQUE: CT of the sinuses performed with intravenous contrast. Multiplanar reformatted images were created and reviewed. The following dose reduction techniques were utilized: automated exposure control and/or adjustment of the mA and/or  KV according to patient size, and the use of an iterative reconstruction technique. CONTRAST: iohexol (OMNIPAQUE) 350 MG/ML injection 100 mL FINDINGS: There is severe opacification of the mid and posterior right ethmoid air cells and right sphenoid sinus with occlusion of the right sphenoethmoidal recess. There is involvement of the right nasal cavity most pronounced posteriorly. There is increased density within the secretions. There is bowing of the right lamina papyracea without obvious orbital extension of disease. There is mild mucosal thickening of the left ethmoid air cells and left sphenoid sinus with occlusion of the left sphenoethmoidal recess. There is mild mucosal thickening of the maxillary sinuses with a potential retention cyst in the right maxillary sinus. There is occlusion of the infundibula. There are secretions in the upper left nasal cavity. There is leftward nasal septal deviation. The retromaxillary fat planes and pterygopalatine fissures are intact. The adenoids and palatine tonsils are prominent. There is no abnormal enhancement within the soft tissues. Limited imaging of the brain is unremarkable. The mastoid air cells and middle ear cavities are clear. There is an impacted tooth with surrounding lucency situated between the right maxillary cuspid and first bicuspid teeth.     1.There is severe mid-posterior right ethmoid air cell and right sphenoid sinus  opacification. There is bowing of the right lamina papyracea. There is involvement of the right nasal cavity. There is increased attenuation within the secretions. Diagnostic possibilities include but are not limited to a mucocele and underlying paranasal sinus disease, severe paranasal sinus disease with inspissated secretions, fungal disease or polyposis. 2.Mild mucosal thickening within the left ethmoid air cells, left sphenoid sinus and both maxillary sinuses. 3.Prominent adenoids and palatine tonsils. Correlation with the patient's  immune status may be of benefit. There is an impacted tooth with surrounding lucency situated between the right maxillary cuspid and first bicuspid teeth. Otho Ket, DO 07/07/2022 3:10 PM    CT Head WO Contrast    Result Date: 07/07/2022  HISTORY: persistent headaches COMPARISON: CT head 07/05/2022 TECHNIQUE: CT of the head performed without intravenous contrast. The following dose reduction techniques were utilized: automated exposure control and/or adjustment of the mA and/or KV according to patient size, and the use of an iterative reconstruction technique. CONTRAST: None. FINDINGS: Cerebral ventricles, cortical sulci, basilar cisterns are within normal limits. Gray-white differentiation is within normal limits. No acute intracranial hemorrhage or mass effect identified. Calvarium appears intact. There is again prominent right sphenoid sinus and posterior right ethmoid air cell disease mildly expanded contours. High attenuation material within these sinuses is likely on the basis of inspissated secretions.      1.  No acute intracranial hemorrhage, mass effect, or hydrocephalus. 2.  Prominent right sphenoid sinus and posterior right ethmoid air cell disease, 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 Melody Haver, MD 07/07/2022 11:51 AM    CT Angiogram Head    Result Date: 07/05/2022  HISTORY: Headache x3 weeks. Reportedly 10 out of 10. No improvement despite treatment. NML neuro exam. No prior hx of aneurysm. COMPARISON: None available. Correlation made with unenhanced CT of the head, performed same day reported separately. TECHNIQUE: CT angiogram of the head performed with intravenous contrast. Multiplanar reformatted and 3D maximum intensity projection (MIP) images were created and reviewed. The following dose reduction techniques were utilized: automated exposure control and/or adjustment of the mA and/or KV according to patient size, and the use of an iterative reconstruction technique. CONTRAST: iohexol (OMNIPAQUE)  350 MG/ML injection 80 mL FINDINGS: The intracranial right internal carotid artery is patent.  The intracranial left internal carotid artery is patent.  Both the intracranial vertebral arteries are patent.  The basilar artery is normal in caliber.  The proximal visualized portions of the anterior cerebral, middle cerebral and posterior cerebral arteries are patent. No aneurysm or AVM is demonstrated.     1.Wide patency of the intracranial arterial circulation. 2.No aneurysm or AVM detected. Waynard Edwards, MD 07/05/2022 6:36 PM    CT Head without Contrast    Result Date: 07/05/2022  HISTORY: Persistent headaches for 3 weeks. COMPARISON: Comparison exam dated 07/01/2022. Correlation made with intracranial CT angiography, performed same day reported separately. TECHNIQUE: CT of the head performed without intravenous contrast. The following dose reduction techniques were utilized: automated exposure control and/or adjustment of the mA and/or KV according to patient size, and the use of an iterative reconstruction technique. CONTRAST: None. FINDINGS: The ventricles are normal in configuration. No intracranial hemorrhage, mass-effect or midline shift is present. No extra-axial fluid collections are seen. There is no CT evidence of an acute cortical infarct. The parenchyma is unremarkable in attenuation. The calvarium is intact. Redemonstrated is complete opacification of the posterior right-sided ethmoid air cells with near complete opacification of the right sphenoid sinus by membrane thickening and fluid which exhibits  heterogeneous hyperattenuation. This is nonspecific and can be seen in the setting of inspissated, protein-rich secretions and/or superimposed fungal infection. This irregular soft tissue extends into the posterior-superior aspect of the right nasal cavity near the nasal choana. There is no abnormal distention or hyperattenuation of either cavernous sinus. The balance of the visualized paranasal  sinuses, middle ear cavities and mastoid air cells are well-pneumatized and clear. The orbital contents are normal. Adenoidal hypertrophy is present.      1.No CT evidence of acute intracranial abnormality. 2.Severe right sphenoid and posterior ethmoid sinusitis for which otolaryngology follow-up consultation and evaluation is again advised. An obstructive lesion at the right sphenoethmoidal recess is not excluded and endoscopic or surgical evaluation should be considered. Waynard Edwards, MD 07/05/2022 6:32 PM    CT Head WO Contrast    Result Date: 07/01/2022  HISTORY: Headache, chronic, new features or increased frequency COMPARISON: None available. TECHNIQUE: CT of the head performed without intravenous contrast. The following dose reduction techniques were utilized: automated exposure control and/or adjustment of the mA and/or KV according to patient size, and the use of an iterative reconstruction technique. CONTRAST: None. FINDINGS: The ventricles are normal in configuration. No intracranial hemorrhage, mass-effect or midline shift is present. No extra-axial fluid collections are seen. There is no CT evidence of an acute cortical infarct. The parenchyma is unremarkable in attenuation. The calvarium is intact. There is complete opacification of the posterior right-sided ethmoid air cells with near complete opacification of the right sphenoid sinus by membrane thickening and fluid which exhibits heterogeneous hyperattenuation. This is nonspecific and can be seen in the setting of inspissated, protein-rich secretions and/or superimposed fungal infection. This irregular soft tissue extends into the posterior-superior aspect of the right nasal cavity near the nasal choana. The balance of the visualized paranasal sinuses, middle ear cavities and mastoid air cells are well-pneumatized and clear. The orbital contents are normal. Adenoidal hypertrophy is present.      1.No CT evidence of acute intracranial  abnormality. 2.Right sphenoid and posterior ethmoid sinusitis for which otolaryngology follow-up consultation is advised. An obstructive lesion at the right sphenoethmoidal recess is not excluded. Waynard Edwards, MD 07/01/2022 9:04 PM    Pathology:   Specimens (From admission, onward)       Start     Ordered    07/11/22 1331  Surgical Pathology  RELEASE UPON ORDERING        References:    Lab Test Reference   Question:  Release to patient  Answer:  Immediate    07/11/22 1331                  Pending Lab Results:   Labs/Images to be followed at your PCP office:   Unresulted Labs       None          Hospitalist:     Signed by: Judson Roch, MD  07/17/2022 11:51 AM  Time spent for discharge including face-to-face and non-face-to-face: > 30 minutes      *This note was generated by the Epic EMR system/ Dragon speech recognition and may contain inherent errors or omissions not intended by the user. Grammatical errors, random word insertions, deletions, pronoun errors and incomplete sentences are occasional consequences of this technology due to software limitations. Not all errors are caught or corrected. If there are questions or concerns about the content of this note or information contained within the body of this dictation they should be addressed directly with the author for  clarification

## 2022-07-18 LAB — LAB USE ONLY - HISTORICAL NON-GYN MEDICAL CYTOLOGY

## 2022-07-18 LAB — LAB USE ONLY - HISTORICAL SURGICAL PATHOLOGY

## 2022-07-19 ENCOUNTER — Ambulatory Visit (INDEPENDENT_AMBULATORY_CARE_PROVIDER_SITE_OTHER): Payer: No Typology Code available for payment source | Admitting: Family Nurse Practitioner

## 2022-07-19 ENCOUNTER — Encounter (INDEPENDENT_AMBULATORY_CARE_PROVIDER_SITE_OTHER): Payer: Self-pay | Admitting: Family Nurse Practitioner

## 2022-07-19 VITALS — BP 129/73 | HR 102 | Temp 98.3°F | Resp 18 | Ht 76.77 in | Wt 307.0 lb

## 2022-07-19 DIAGNOSIS — Z09 Encounter for follow-up examination after completed treatment for conditions other than malignant neoplasm: Secondary | ICD-10-CM

## 2022-07-19 DIAGNOSIS — E1069 Type 1 diabetes mellitus with other specified complication: Secondary | ICD-10-CM

## 2022-07-19 DIAGNOSIS — J029 Acute pharyngitis, unspecified: Secondary | ICD-10-CM

## 2022-07-19 MED ORDER — INSULIN GLARGINE 100 UNIT/ML SC SOPN
20.0000 [IU] | PEN_INJECTOR | Freq: Two times a day (BID) | SUBCUTANEOUS | 1 refills | Status: DC
Start: 2022-07-19 — End: 2022-09-05

## 2022-07-19 NOTE — Progress Notes (Signed)
Subjective:      Patient ID: [name] is a 21 y.o. male.    Chief Complaint:  Chief Complaint   Patient presents with    Establish Care     Recently moved from [place] to [place].  Was seeing endo, and seeing the endocrinologist.  Hasn't seen one since.     Hospital Follow-up     Throat is still hurting.  Headaches have decreased    Diabetes     Discuss insulin medication.           HPI   Here for follow-up from recent hospitalization.   Inpatient facility: [facility]  Date of admission: November 18  Date of discharge: November 24  Discharge diagnosis:  sinusitis, and migraines  Hospital course: 6 days  Prior hospitalization within past 30 days: ER visits, no admissions   Prior hospitalization within past 6 months: No  Comorbid conditions: diabetes  Medical procedure(s) performed: IV hydration and IV antibiotic  Surgical procedure(s) performed:  sinus surgery  Diagnostic tests performed:  Sinus CT and CT head, MRI face/neck, CTA  Pending tests:  blood cultures  Home care: ADL's intact  Specialist follow-up: ENT- has not made appointment yet, at [place] advised 2 week FOV.  Post-hospital monitoring:  DM education and monitoring for recurrent fever  Mobility status: normal mobility and ADL's intact     Medication Reconciliation(required):  Home medications were reviewed and reconciled with current medication list within electronic medical record. Requests refill of lantus insulin. States not taking metformin.     Advanced Directives:  Has no Advanced Directive. Not interested in additional information.    Additional HPI:   Patient states he was diagnosed last year with Type 1 DM. Circumstances around event include: Patient remembers feeling unwell. Patients significant other has Type 1 DM and patient had good understanding of symptoms and was feeling symptoms himself (polyuria - bathroom every 5 seconds, stomach pains). Went to hospital at that time and had blood sugar checked which was in the 600s.  Was diagnosed type 1 at that time and put on Metformin (when in [place]). Patient confirms being told he was Type 1 DM and not Type 2.     Patient later seen by Endocrinologist in [place], where he was living. Endocrinologist put him on lantus BID. Has only ever been on long acting insulin. Was told at some point he may require short acting insulin, though was never initiated on this. Patient metformin was discontinued by Endocrinologist. Patient continuously checks BG during the day. Patient reports large range of sugars throughout the day.  Patient reports sometimes skipping meals due to lack of appetite.      When patient was in hospital recently (not for DM related concern - was having sinus procedure), was found to have elevated A1C of 9.5%. Patient had also been taking a recent course of prednisone for sinus issues which was elevating sugars. Prednisone was stopped and patient was added metformin by hospital team. Patient reports not starting the metformin as he was not explained to why it was ordered and previous endocrinologist discontinued it. Patient seeking new endocrinologist as he has now moved to this area where father lives.     Patient Active Problem List   Diagnosis    Acute intractable headache, unspecified headache type     Outpatient Medications Marked as Taking for the 07/19/22 encounter (Office Visit) with Elgie Congo, FNP   Medication Sig Dispense Refill    amoxicillin-clavulanate (AUGMENTIN) 875-125  MG per tablet Take 1 tablet by mouth 2 (two) times daily for 21 days 42 tablet 0    fluconazole (DIFLUCAN) 100 MG tablet Take 1 tablet (100 mg) by mouth every 24 hours for 21 days 21 tablet 0    rizatriptan (MAXALT) 5 MG tablet Take 2 tablets (10 mg) by mouth as needed for Migraine May repeat in 2 hours if needed 10 tablet 0    [DISCONTINUED] insulin glargine 100 UNIT/ML pen-injector Inject 20 Units into the skin 2 (two) times daily 15 mL 1     No Known Allergies    Past Medical History:    Diagnosis Date    DM (diabetes mellitus)     H/O blood clots 2022    Type 1 diabetes mellitus      Past Surgical History:   Procedure Laterality Date    ENDOSCOPIC SINUS SURGERY (FESS) Right 07/11/2022    Procedure: ENDOSCOPIC SINUS SURGERY (FESS) Right subtotal ethmoidectomy with frontal sinusotomy, Right sphenoidectomy with removal of content, right inferior turbinate outfracture and reduction, Right nasopharyngeal biopsy;  Surgeon: Francis Gaines, MD;  Location: [facility];  Service: ENT;  Laterality: Right;     Family History   Problem Relation Age of Onset    Diabetes Maternal Grandfather     Colon cancer Neg Hx     Prostate cancer Neg Hx     Heart attack Neg Hx     Heart disease Neg Hx     Stroke Neg Hx      Social History     Tobacco Use    Smoking status: Never     Passive exposure: Never    Smokeless tobacco: Never   Vaping Use    Vaping Use: Never used   Substance Use Topics    Alcohol use: Never    Drug use: Never       The following sections were reviewed this encounter by the provider:   Tobacco  Allergies  Meds  Problems  Med Hx  Surg Hx  Fam Hx          Review of Systems   Constitutional:  Negative for activity change, appetite change, chills, diaphoresis, fatigue, fever and unexpected weight change.   HENT: Negative.  Negative for congestion, ear pain, facial swelling, sinus pressure and sinus pain.    Eyes:  Negative for visual disturbance.   Respiratory:  Negative for cough, chest tightness, shortness of breath and wheezing.    Cardiovascular:  Negative for chest pain and palpitations.   Gastrointestinal:  Negative for abdominal pain, blood in stool, diarrhea, nausea and vomiting.   Endocrine: Negative for polydipsia, polyphagia and polyuria.   Genitourinary:  Negative for dysuria and hematuria.   Musculoskeletal:  Negative for back pain and gait problem.   Skin:  Negative for rash.   Allergic/Immunologic: Negative for immunocompromised state.   Neurological:  Negative for  dizziness, syncope, weakness and headaches.   Hematological:  Negative for adenopathy.   Psychiatric/Behavioral:  Negative for behavioral problems and confusion. The patient is not nervous/anxious.    All other systems reviewed and are negative.    Vitals:  BP 129/73 (BP Site: Left arm, Patient Position: Sitting, Cuff Size: Large)   Pulse (!) 102   Temp 98.3 F (36.8 C) (Oral)   Resp 18   Ht 1.95 m (6' 4.77")   Wt 139.3 kg (307 lb)   SpO2 97%   BMI 36.62 kg/m     Objective:  Physical Exam  Vitals and nursing note reviewed.   Constitutional:       General: He is awake. He is not in acute distress.     Appearance: Normal appearance. He is not ill-appearing.   HENT:      Head: Normocephalic and atraumatic.      Right Ear: Tympanic membrane, ear canal and external ear normal. There is no impacted cerumen.      Left Ear: Tympanic membrane, ear canal and external ear normal. There is no impacted cerumen.      Nose: Nose normal. No rhinorrhea.      Right Sinus: No maxillary sinus tenderness or frontal sinus tenderness.      Left Sinus: No maxillary sinus tenderness or frontal sinus tenderness.      Mouth/Throat:      Lips: Pink.      Mouth: Mucous membranes are moist.      Pharynx: Oropharynx is clear. No oropharyngeal exudate.      Comments: Uvula midline, curved upward  Eyes:      General: No scleral icterus.        Right eye: No discharge.         Left eye: No discharge.      Conjunctiva/sclera: Conjunctivae normal.   Neck:      Thyroid: No thyroid mass.   Cardiovascular:      Rate and Rhythm: Normal rate.      Pulses: Normal pulses.           Radial pulses are 2+ on the right side and 2+ on the left side.      Heart sounds: Normal heart sounds, S1 normal and S2 normal.   Pulmonary:      Effort: Pulmonary effort is normal. No respiratory distress or retractions.      Breath sounds: Normal breath sounds and air entry. No wheezing, rhonchi or rales.   Abdominal:      General: Abdomen is flat. Bowel sounds are  normal. There is no distension.      Palpations: Abdomen is soft. There is no mass.      Tenderness: There is no abdominal tenderness. There is no guarding or rebound.   Musculoskeletal:         General: No deformity. Normal range of motion.      Cervical back: Normal range of motion.      Comments: 5/5 strength in bilateral upper and lower extremities   Skin:     General: Skin is warm and dry.      Capillary Refill: Capillary refill takes less than 2 seconds.   Neurological:      General: No focal deficit present.      Mental Status: He is alert and oriented to person, place, and time.      Sensory: Sensation is intact.      Motor: No weakness.      Coordination: Coordination normal.      Gait: Gait normal.   Psychiatric:         Attention and Perception: Attention normal.         Mood and Affect: Mood normal.         Speech: Speech normal.         Behavior: Behavior normal. Behavior is cooperative.         Thought Content: Thought content normal.         Judgment: Judgment normal.       Assessment / Plan  1. Hospital discharge follow-up    - Referral to ENT provided by hospital discharge team - working with office referral coordinator to set patient up with appointment.  - Urgent referral to Endo placed for type 1 DM control - working with office referral coordinator to set patient up with appointment.  - Refill of lantus provided. Patient denied need for other medication refills.     2. Type 1 diabetes mellitus with other specified complication  - Referral to Endocrinology ([place]); Future  - insulin glargine 100 UNIT/ML pen-injector; Inject 20 Units into the skin 2 (two) times daily  Dispense: 15 mL; Refill: 1    Status- chronic, worsening      Lab Results   Component Value Date    HGBA1C 9.5 (H) 07/07/2022      No results found for: "URMICROALB"  Current A1C is elevated above goal..  Continue current DM medication regimen. Recommend optimizing low carbohydrate diet efforts and drinking at least 60 ounces of  H20 daily.  Exercise goal - 150 minutes of sustained cardio exercise per week. Recommend home glucometer testing pre-meals three times a day. Retinopathy surveillance is due.  Recommend follow-up with ophthalmologist. Podiatry surveillance is due.  Recommend follow-up with podiatry.    Discussed ordering CGM - patient deferred, does not want continuous monitor at this time. Reports compliance with regular BG check throughout the day.    Patient currently skipping meals. Discussed importance of regular meals, snacks on hand to reduce variations in blood sugar. At this time, due to contradictions in medication management from previous Endocrinologist and hospital care team, will continue patient existing regimen by endocrinologist of Lantus 20 units BID and await recommendation by new endocrinologist for further management. Referral placed -  communication made with office referral coordinator to set patient up with appointment.    Discussed with patient should severe symptoms occur, including but not limited to: chest pain, shortness of breath, palpitations, dizziness, lightheadedness, critically high or critically low blood sugars, recommend immediately going to the emergency department for prompt medical intervention. Patient verbalized understanding.     3. Sore throat    Status- acute, stable      Suspect r/t recent intubation / surgery. Discussed with patient may utilize acetaminophen or ibuprofen for pain control. Drink room temperature fluids and ensure adequate hydration.   Discuss at FOV with ENT. Recommend follow up if symptoms worsen, persist, or for additional questions or concerns.    Opportunity provided to ask questions in regard to visit today.   Patient with no further questions / concerns at end of visit.     Plan:   Disease/Illness Education:  Patient educated regarding symptom management, adherence to current medication regimen and current plan of care. Patient encouraged to contact us with any  questions or needs.  Patient agreeable to current plan of care and verbalizes understanding of information provided.  Home Health Needs:  No current home health services needed at this time.  Community Service Needs:  No community services needed at this time.  Establishment of Referral Orders:  New referrals entered into EMR - see orders section  Referral to ENT provided by hospital discharge team - working with office referral coordinator to set patient up with appointment.  Urgent referral to Endo placed for type 1 DM control - working with office referral coordinator to set patient up with appointment.  Communication with Health Care Providers:  No active communication with additional health care providers needed at this time.  [place]  90+ day Treatment Plan:  Not applicable    Transitional Care Management Coding Criteria:  2) Face-to-face visit occurred within 14 days of discharge.  3) Complexity of medical decision making is moderate.  TCM coding criteria not met.    No follow-ups on file.    Elgie Congo, FNP          The following activities were performed on the date of service:  preparing to see the patient: - chart review   - review of prior labs  - review of prior imaging tests  - review of consultant reports  - review of prior hospitalizations  obtaining and/or reviewing the separately obtained history  performing a medically appropriate examination and/or evaluation   counseling and educating the patient/family/caregiver  ordering medications, tests, or procedures  referring and communicating with other health care professionals (when not separately reported)   documenting clinical information in the electronic or other health records  independently interpreting results (not separately reported) and communicating results to the patient/family/caregiver  care coordination (not separately reported)    Total time spent performing activities on date of service:  60  minutes

## 2022-07-25 ENCOUNTER — Telehealth (INDEPENDENT_AMBULATORY_CARE_PROVIDER_SITE_OTHER): Payer: No Typology Code available for payment source | Admitting: "Endocrinology

## 2022-07-25 NOTE — Progress Notes (Deleted)
INITIAL ENDOCRINE VISIT    Verbal consent has been obtained from the patient to conduct a video visit.  Patient has verified that he/she are physically located in the state of [place] during the time of our appointment.     Date Time: 07/25/2022 1:11 PM  Patient Name: [facility]    Reason for Referral:   No chief complaint on file.       History of present Illness:   Tremayne Sheldon  is a 21 y.o. year old male who presents to clinic for initial evaluation of T2DM    diagnosed with Type 2 diabetes mellitus::  ***    Current diabetic medications include    Past medications-    DM control in the last yr  Lab Results   Component Value Date    HGBA1C 9.5 (H) 07/07/2022       Checks FS {NUMBER 1-6:38039::"2"} times daily   {Findings; diabetes glucometry results:16657:p}  Hypoglycemia?: {YES/NO/UNKNOWN:38663::"No"}  Diet:   Breakfast ***  Lunch ***  Dinner ***  Snacks ***  Prior visit with dietician: {yes***/no:17258}  Exercise: {yes:21351::"No"}  Current symptoms/problems include   Wt change-    Known long term diabetic complications: {diabetes complications:1215}  Eye exam within the last year: {YES/NO/UNKNOWN:38663::"Yes"}, hx of retinopathy-  Tingling numbness in feet,-   Hx of foot ulcer, infections, amputations-   Renal function  Lab Results   Component Value Date    CREAT 1.3 07/09/2022     Microalb-  Hx of ASCVD-    Is He on ACE inhibitor or angiotensin II receptor blocker? {yes/no/not indicated:16556}   On statin-   No results found for: "CHOL", "LDL", "HDL", "TRIG"       Patient History:     Past Medical History:   Diagnosis Date    DM (diabetes mellitus)     H/O blood clots 2022    Type 1 diabetes mellitus        Past Surgical History:   Procedure Laterality Date    ENDOSCOPIC SINUS SURGERY (FESS) Right 07/11/2022    Procedure: ENDOSCOPIC SINUS SURGERY (FESS) Right subtotal ethmoidectomy with frontal sinusotomy, Right sphenoidectomy with removal of content,  right inferior turbinate outfracture and reduction, Right nasopharyngeal biopsy;  Surgeon: Francis Gaines, MD;  Location: [facility];  Service: ENT;  Laterality: Right;       Family History   Problem Relation Age of Onset    Diabetes Maternal Grandfather     Colon cancer Neg Hx     Prostate cancer Neg Hx     Heart attack Neg Hx     Heart disease Neg Hx     Stroke Neg Hx        Social History     Socioeconomic History    Marital status: Single   Tobacco Use    Smoking status: Never     Passive exposure: Never    Smokeless tobacco: Never   Vaping Use    Vaping Use: Never used   Substance and Sexual Activity    Alcohol use: Never    Drug use: Never     Social Determinants of Health     Food Insecurity: No Food Insecurity (07/07/2022)    Hunger Vital Sign     Worried About Running Out of Food in the Last Year: Never true     Ran Out of Food in the Last Year: Never true   Transportation Needs: No Transportation Needs (07/07/2022)    PRAPARE - Transportation  Lack of Transportation (Medical): No     Lack of Transportation (Non-Medical): No   Intimate Partner Violence: Not At Risk (07/07/2022)    Humiliation, Afraid, Rape, and Kick questionnaire     Fear of Current or Ex-Partner: No     Emotionally Abused: No     Physically Abused: No     Sexually Abused: No   Housing Stability: Low Risk  (07/07/2022)    Housing Stability Vital Sign     Unable to Pay for Housing in the Last Year: No     Number of Places Lived in the Last Year: 1     Unstable Housing in the Last Year: No       No Known Allergies    Review of Systems:   ROS      Physical Exam:   There were no vitals taken for this visit.     Wt Readings from Last 3 Encounters:   07/19/22 139.3 kg (307 lb)   07/07/22 144.1 kg (317 lb 10.9 oz)   07/08/22 144.1 kg (317 lb 10.9 oz)        General appearance - alert, well appearing, and in no distress  Eyes -  no redness, no proptosis  Neck - supple, no significant adenopathy   Thyroid: {IS/IS NOT:25328::"is not"}  enlarged, nodules {yes:21351::"No"}  Heart - regular, rate and rhythm, no murmurs  Lungs: CTA, no addition sounds  Abdomen - soft, nontender, nondistended.  Neurological - alert, oriented, normal speech  Psych; normal affect and mood  Extremities - no pedal edema    Medications:     Current Outpatient Medications:     amoxicillin-clavulanate (AUGMENTIN) 875-125 MG per tablet, Take 1 tablet by mouth 2 (two) times daily for 21 days, Disp: 42 tablet, Rfl: 0    butalbital-acetaminophen-caffeine (FIORICET) 50-325-40 MG per tablet, Take 1 tablet by mouth every 4 (four) hours as needed for Headaches (Patient not taking: Reported on 07/19/2022), Disp: 15 tablet, Rfl: 0    fluconazole (DIFLUCAN) 100 MG tablet, Take 1 tablet (100 mg) by mouth every 24 hours for 21 days, Disp: 21 tablet, Rfl: 0    ibuprofen (ADVIL) 600 MG tablet, Take 1 tablet (600 mg) by mouth every 8 (eight) hours as needed for Pain (Patient not taking: Reported on 07/19/2022), Disp: 18 tablet, Rfl: 0    insulin glargine 100 UNIT/ML pen-injector, Inject 20 Units into the skin 2 (two) times daily, Disp: 15 mL, Rfl: 1    metFORMIN (GLUCOPHAGE) 500 MG tablet, Take 1 tablet (500 mg) by mouth 2 (two) times daily with meals START with 500 mg daily for 1 week then twice a day (Patient not taking: Reported on 07/19/2022), Disp: 60 tablet, Rfl: 1    rizatriptan (MAXALT) 5 MG tablet, Take 2 tablets (10 mg) by mouth as needed for Migraine May repeat in 2 hours if needed, Disp: 10 tablet, Rfl: 0    Labs:   {labmastershik:62732}    Rads:   none      Assessment:       There are no diagnoses linked to this encounter.      Plan:     Diabetes:  Lab Results   Component Value Date    HGBA1C 9.5 (H) 07/07/2022    EGFR >60.0 07/09/2022    EGFR >60.0 07/08/2022    CREAT 1.3 07/09/2022       {DM Plan - A1C goals/trends:35324}  Medications-   {DMPLANGLUCOMETER:35459:o}  {DM Plan surveillance:35327}  Recommend watching carb portions-  limiting to 30 gms/meal and 10 gms/snack, no more  than 1-2 snacks a day. discussed necessary dietary changes to improve the glucose control.  Role of exercise and its effect on glucose metabolism discussed- recommend biking, walking, light jog to help improve sugar levels.   Discussed the risk of complications if DM not under control. Stressed importance of diet and medication adherence for the glucose levels to improve.       No follow-ups on file.    Time spent in this visit:    Over 50% of this time was spent in reviewing medical records and summarizing for patient, patient education and counseling, treatment and management of the above noted plan.  I reinforced key topics in the discussion and answered every question to the patient's satisfaction.      Signed by: Andres Labrum, MD  [facility]

## 2022-09-04 ENCOUNTER — Encounter (INDEPENDENT_AMBULATORY_CARE_PROVIDER_SITE_OTHER): Payer: Self-pay | Admitting: Nurse Practitioner

## 2022-09-04 ENCOUNTER — Ambulatory Visit (INDEPENDENT_AMBULATORY_CARE_PROVIDER_SITE_OTHER): Payer: BC Managed Care – PPO | Admitting: Nurse Practitioner

## 2022-09-04 ENCOUNTER — Encounter (INDEPENDENT_AMBULATORY_CARE_PROVIDER_SITE_OTHER): Payer: Self-pay | Admitting: Family Nurse Practitioner

## 2022-09-04 VITALS — BP 136/68 | HR 103 | Temp 98.0°F | Resp 17

## 2022-09-04 DIAGNOSIS — E1069 Type 1 diabetes mellitus with other specified complication: Secondary | ICD-10-CM

## 2022-09-04 LAB — CBC AND DIFFERENTIAL
Absolute NRBC: 0 10*3/uL (ref 0.00–0.00)
Basophils Absolute Automated: 0.04 10*3/uL (ref 0.00–0.08)
Basophils Automated: 0.7 %
Eosinophils Absolute Automated: 0.18 10*3/uL (ref 0.00–0.44)
Eosinophils Automated: 3.1 %
Hematocrit: 43.5 % (ref 37.6–49.6)
Hgb: 15 g/dL (ref 12.5–17.1)
Immature Granulocytes Absolute: 0.02 10*3/uL (ref 0.00–0.07)
Immature Granulocytes: 0.3 %
Instrument Absolute Neutrophil Count: 3.09 10*3/uL (ref 1.10–6.33)
Lymphocytes Absolute Automated: 2.14 10*3/uL (ref 0.42–3.22)
Lymphocytes Automated: 36.3 %
MCH: 28 pg (ref 25.1–33.5)
MCHC: 34.5 g/dL (ref 31.5–35.8)
MCV: 81.3 fL (ref 78.0–96.0)
MPV: 10.2 fL (ref 8.9–12.5)
Monocytes Absolute Automated: 0.43 10*3/uL (ref 0.21–0.85)
Monocytes: 7.3 %
Neutrophils Absolute: 3.09 10*3/uL (ref 1.10–6.33)
Neutrophils: 52.3 %
Nucleated RBC: 0 /100 WBC (ref 0.0–0.0)
Platelets: 243 10*3/uL (ref 142–346)
RBC: 5.35 10*6/uL (ref 4.20–5.90)
RDW: 12 % (ref 11–15)
WBC: 5.9 10*3/uL (ref 3.10–9.50)

## 2022-09-04 LAB — COMPREHENSIVE METABOLIC PANEL
ALT: 45 U/L (ref 0–55)
AST (SGOT): 33 U/L (ref 5–41)
Albumin/Globulin Ratio: 1.2 (ref 0.9–2.2)
Albumin: 3.8 g/dL (ref 3.5–5.0)
Alkaline Phosphatase: 67 U/L (ref 37–117)
Anion Gap: 9 (ref 5.0–15.0)
BUN: 8 mg/dL — ABNORMAL LOW (ref 9.0–28.0)
Bilirubin, Total: 0.3 mg/dL (ref 0.2–1.2)
CO2: 28 mEq/L (ref 17–29)
Calcium: 9 mg/dL (ref 8.5–10.5)
Chloride: 102 mEq/L (ref 99–111)
Creatinine: 1.3 mg/dL (ref 0.5–1.5)
Globulin: 3.2 g/dL (ref 2.0–3.6)
Glucose: 190 mg/dL — ABNORMAL HIGH (ref 70–100)
Potassium: 4.1 mEq/L (ref 3.5–5.3)
Protein, Total: 7 g/dL (ref 6.0–8.3)
Sodium: 139 mEq/L (ref 135–145)
eGFR: >60 mL/min/{1.73_m2} (ref >=60)

## 2022-09-04 LAB — POCT HEMOGLOBIN A1C: POCT Hgb A1C: 8.5 % — AB (ref 3.9–5.9)

## 2022-09-04 LAB — HEMOLYSIS INDEX: Hemolysis Index: 13 Index (ref 0–24)

## 2022-09-04 LAB — MICROALBUMIN, RANDOM URINE
Urine Creatinine, Random: 233.8 mg/dL
Urine Microalbumin, Random: 14 ug/ml (ref 0.0–30.0)
Urine Microalbumin/Creatinine Ratio: 6 ug/mg (ref 0–30)

## 2022-09-04 LAB — POCT GLUCOSE: Whole Blood Glucose POCT: 200 mg/dL — AB (ref 70–100)

## 2022-09-04 NOTE — Progress Notes (Signed)
[facility] OFFICE VISIT           [name]  is a 22 y.o.  male.  Patient states that he isn't "feeling well".  He checked his blood sugar a few hours ago and it was in the 400's .  He injected insulin after seeing the results.  He states that he has not had anything to eat and only water to drink today.  He is feeling weak.    2022 Diagnosis -- Found to be in DKA in [place]- new onset DM. Was following with endocrine there. Initially was on metformin, glipizide- unsure if they felt it was type 2 or type 1.   Then moved to [place]- put on the insulin. Was still in the process of getting this under control. They felt strongly it was type 1.     Moved to [place] this past year. In November was in the hospital for hyperglycemia. Patient is not on the metformin. Stopped this about early 2023.   Right now only taking the basal insulin 20 units twice a day-- been on this regimen since the hospitalization in November.   He increased from 10 units BID to 20 in November.     Reports numbers have been all over the place. Low: 40s High 500s. This has gotten worse in the last one week.   Checks sugar 3-4 x a day.     Recent URI-- last week- this is improved.     Has missed couple doses but overall very compliant.   Still has medication.     Previously offered CGM- not interested.   Has follow up to establish care with endocrine [facility] tomorrow 09/05/22        Review of Systems   Constitutional:  Positive for fatigue. Negative for activity change, appetite change, chills, diaphoresis, fever and unexpected weight change.   Respiratory:  Negative for apnea, cough, choking, chest tightness, shortness of breath, wheezing and stridor.    Cardiovascular:  Negative for chest pain, palpitations and leg swelling.   Gastrointestinal:  Negative for constipation, diarrhea, nausea and vomiting.   Skin:  Negative for color change and pallor.   Allergic/Immunologic: Negative for immunocompromised state.   All other systems reviewed and are  negative.       BP 136/68   Pulse (!) 103   Temp 98 F (36.7 C) (Oral)   Resp 17   SpO2 96%    Physical Exam  Vitals and nursing note reviewed.   Constitutional:       General: He is not in acute distress.     Appearance: Normal appearance. He is well-developed and well-groomed. He is not ill-appearing, toxic-appearing or diaphoretic.   Skin:     General: Skin is warm and dry.      Capillary Refill: Capillary refill takes less than 2 seconds.   Neurological:      Mental Status: He is alert and oriented to person, place, and time.   Psychiatric:         Behavior: Behavior normal. Behavior is cooperative.          1. Type 1 diabetes mellitus with other specified complication  - POCT glucose  - POCT Hemoglobin A1C  - Comprehensive metabolic panel  - Urine Microalbumin Random  - CBC and differential    Recent Results (from the past 24 hour(s))   POCT glucose    Collection Time: 09/04/22  3:35 PM   Result Value Ref Range  Whole Blood Glucose POCT 200 (A) 70 - 100 mg/dL   POCT Hemoglobin A1C    Collection Time: 09/04/22  3:35 PM   Result Value Ref Range    POCT Hgb A1C 8.5 (A) 3.9 - 5.9 %     Diabetes:  Lab Results   Component Value Date    HGBA1C 8.5 (A) 09/04/2022    HGBA1C 9.5 (H) 07/07/2022      No results found for: "URMICROALB"    Status- chronic, exacerbated      Discussed patients current medication regimen.  Still has medication.   Encouraged CGM- patient is agreeable, would prefer 14 day -- will discuss this with endocrine tomorrow. Educated about this.   Will check labs.   Discussed values that would benefit from going to the ED.  Would benefit from bolus dosing-- will defer to endocrine tomorrow.  Advised close follow up with primary care.   Can also see Dr. Shearon Stalls.     No follow-ups on file.     The following activities were performed on the date of service:  preparing to see the patient: - chart review   - review of prior labs  - review of prior ED visits  obtaining and/or reviewing the separately  obtained history  performing a medically appropriate examination and/or evaluation   counseling and educating the patient/family/caregiver  referring and communicating with other health care professionals (when not separately reported)   documenting clinical information in the electronic or other health records    Total time spent performing activities on date of service:  40  minutes

## 2022-09-05 ENCOUNTER — Encounter (INDEPENDENT_AMBULATORY_CARE_PROVIDER_SITE_OTHER): Payer: Self-pay | Admitting: "Endocrinology

## 2022-09-05 ENCOUNTER — Telehealth (INDEPENDENT_AMBULATORY_CARE_PROVIDER_SITE_OTHER): Payer: BC Managed Care – PPO | Admitting: "Endocrinology

## 2022-09-05 DIAGNOSIS — E1069 Type 1 diabetes mellitus with other specified complication: Secondary | ICD-10-CM

## 2022-09-05 MED ORDER — DEXCOM G6 RECEIVER DEVI
0 refills | Status: DC
Start: 2022-09-05 — End: 2023-05-22

## 2022-09-05 MED ORDER — DEXCOM G6 TRANSMITTER MISC
0 refills | Status: DC
Start: 2022-09-05 — End: 2023-05-22

## 2022-09-05 MED ORDER — DEXCOM G6 SENSOR MISC
0 refills | Status: DC
Start: 2022-09-05 — End: 2023-05-22

## 2022-09-05 MED ORDER — INSULIN GLARGINE 100 UNIT/ML SC SOPN
20.0000 [IU] | PEN_INJECTOR | Freq: Two times a day (BID) | SUBCUTANEOUS | 1 refills | Status: DC
Start: 2022-09-05 — End: 2023-05-22

## 2022-09-05 NOTE — Patient Instructions (Addendum)
It was a pleasure meeting you today and thank you for trusting [place] with your health.  Recommendations from today  Continue Lantus 20U twice a day.   Keep the carbs controlled until we start you on rapid acting insulin.   Labs ordered today to confirm the diagnosis of Type I Diabetes  I have sent the order for [facility] system  The sensor has to be changed every 10 days, the transmitter (grey plastic device that fits in the sensor) needs to changed every 90 days. You will get alarms for high and low glucose,There is always a lag between the reading on the reader and fingerstick, if the difference is >30 points that rely on the fingerstick. If it occurs multiple times, call the customer care number to get a replacement on the sensor.  If you are using the reader, please bring it to every appointment for download. The reader has to be charged for that.  If you choose to use reader, we will have to do visits in person to download the reader and it has to be charged for [place] to download that.If using app on the phone- please download [employer] app. Please remember the login as we will need it to connect it to our clinic system. With latter, we can remotely get the readings downloaded and can continue doing virtual visits with in between in person visits for exam  If not able to place it on your own, reach out and we can schedule a nurse visit for that.

## 2022-09-05 NOTE — Progress Notes (Signed)
Date Time: 09/05/2022 3:33 PM  Patient Name: [name]    Verbal consent has been obtained from the patient to conduct a video visit.  Patient has verified that he/she are physically located in the state of [place] during the time of our appointment.    Reason for Referral:     Chief Complaint   Patient presents with    Diabetes type 1        History of present Illness:   [name]  is a 22 y.o. year old male who presents to clinic for initial  evaluation of T1DM    The patient was initially diagnosed with Type 1 diabetes mellitus::  2022  Had seen an Endo in the past but that was long time ago before he moved here, moved from [place]  He was told he more likely has Type I diabetes.     Current insulin therapy  Taking 20U Lantus twice a day.  Was on metformin, glipizide right after diagnosis when it was not clear if its type I    Highest HbA1c  Lab Results   Component Value Date    HGBA1C 8.5 (A) 09/04/2022    HGBA1C 9.5 (H) 07/07/2022   No hx of DKA in the past    Checks FS 3-4 times daily   Fasting- 100-140 mg/dl, at times 161 mg/dl, has had glucose levels as low as 50s. Can't tell me a pattern of how and when.   Not on CGM, did discuss DEXCOM with PCP.   Current symptoms/problems include headaches, no change in wt, fatigue, increased urination, thirst.   Wt change-   Wt Readings from Last 3 Encounters:   09/05/22 131.5 kg (290 lb)   07/19/22 139.3 kg (307 lb)   07/07/22 144.1 kg (317 lb 10.9 oz)   Diet- carb controlled, met a nutritionist long time ago. Understands carbs.     Known long term diabetic complications: nephropathy  Lab Results   Component Value Date    CREAT 1.3 09/04/2022   Microalb/creat ratio- WNL  Tingling numbness in feet, hands- none  Hx of foot ulcer, infections, amputations- none    In 06/2022, had sinus infection, which has cleared since.   endoscopic sinus surgery with right subtotal ethmoidectomy and frontal sinusotomy and right sphenoidectomy with removal of content and right inferior  turbinate outfracture and reduction right nasopharyngeal biopsy.     Patient History:     Past Medical History:   Diagnosis Date    DM (diabetes mellitus)     H/O blood clots 2022    Type 1 diabetes mellitus        Past Surgical History:   Procedure Laterality Date    ENDOSCOPIC SINUS SURGERY (FESS) Right 07/11/2022    Procedure: ENDOSCOPIC SINUS SURGERY (FESS) Right subtotal ethmoidectomy with frontal sinusotomy, Right sphenoidectomy with removal of content, right inferior turbinate outfracture and reduction, Right nasopharyngeal biopsy;  Surgeon: Charles Gaines, MD;  Location: [facility];  Service: ENT;  Laterality: Right;       Family History   Problem Relation Age of Onset    Diabetes Maternal Grandfather     Colon cancer Neg Hx     Prostate cancer Neg Hx     Heart attack Neg Hx     Heart disease Neg Hx     Stroke Neg Hx        Social History     Socioeconomic History    Marital status: Single   Tobacco Use  Smoking status: Never     Passive exposure: Never    Smokeless tobacco: Never   Vaping Use    Vaping Use: Never used   Substance and Sexual Activity    Alcohol use: Never    Drug use: Never     Social Determinants of Health     Food Insecurity: No Food Insecurity (07/07/2022)    Hunger Vital Sign     Worried About Running Out of Food in the Last Year: Never true     Ran Out of Food in the Last Year: Never true   Transportation Needs: No Transportation Needs (07/07/2022)    PRAPARE - Armed forces logistics/support/administrative officer (Medical): No     Lack of Transportation (Non-Medical): No   Intimate Partner Violence: Not At Risk (07/07/2022)    Humiliation, Afraid, Rape, and Kick questionnaire     Fear of Current or Ex-Partner: No     Emotionally Abused: No     Physically Abused: No     Sexually Abused: No   Housing Stability: [place]  (07/07/2022)    Housing Stability Vital Sign     Unable to Pay for Housing in the Last Year: No     Number of [place] in the Last Year: 1     Unstable Housing in the  Last Year: No       No Known Allergies    Review of Systems:   Review of Systems   Constitutional:  Negative for diaphoresis, malaise/fatigue and weight loss.   HENT:  Negative for congestion and sore throat.    Respiratory:  Negative for shortness of breath and wheezing.    Cardiovascular:  Negative for claudication and leg swelling.   Gastrointestinal:  Negative for abdominal pain, nausea and vomiting.   Genitourinary:  Negative for frequency.   Musculoskeletal:  Negative for back pain.   Neurological:  Positive for headaches. Negative for dizziness, focal weakness and seizures.   Endo/Heme/Allergies:  Negative for polydipsia.   Psychiatric/Behavioral:  The patient is not nervous/anxious and does not have insomnia.          Physical Exam:   Visit Vitals  Ht 1.956 m (6\' 5" )   Wt 131.5 kg (290 lb)   BMI 34.39 kg/m        Wt Readings from Last 3 Encounters:   09/05/22 131.5 kg (290 lb)   07/19/22 139.3 kg (307 lb)   07/07/22 144.1 kg (317 lb 10.9 oz)        General appearance - alert, well appearing, and in no distress  Neurological - alert, oriented, normal speech  Psych; normal affect and mood      Medications:     Current Outpatient Medications:     continuous blood glucose receiver (DEXCOM G6) device, Use as directed per manufacturer instructions to monitor blood glucose daily. For diagnosis of [dx diabetes:45011}, Disp: 1 each, Rfl: 0    continuous blood glucose sensor (DEXCOM G6) device, Use as directed per manufacturer instructions to monitor blood glucose daily. Change every 10 days. For diagnosis of [dx diabetes:45011}, Disp: 3 each, Rfl: 0    continuous blood glucose transmitter (DEXCOM G6) device, Use as directed per manufacturer instructions to monitor blood glucose daily. Change every 3 months. For diagnosis of [dx diabetes:45011}, Disp: 1 each, Rfl: 0    insulin glargine 100 UNIT/ML pen-injector, Inject 20 Units into the skin 2 (two) times daily, Disp: 15 mL, Rfl: 1    Labs:  Lab Results   Component  Value Date    HGBA1C 8.5 (A) 09/04/2022    HGBA1C 9.5 (H) 07/07/2022    EGFR >60.0 09/04/2022    EGFR >60.0 07/09/2022    CREAT 1.3 09/04/2022    CREAT 1.3 07/09/2022          Rads:   None to review    Assessment:       1. Type 1 diabetes mellitus with other specified complication  - Referral to Endocrinology ([place])  - continuous blood glucose sensor (DEXCOM G6) device; Use as directed per manufacturer instructions to monitor blood glucose daily. Change every 10 days. For diagnosis of [dx diabetes:45011}  Dispense: 3 each; Refill: 0  - continuous blood glucose transmitter (DEXCOM G6) device; Use as directed per manufacturer instructions to monitor blood glucose daily. Change every 3 months. For diagnosis of [dx diabetes:45011}  Dispense: 1 each; Refill: 0  - continuous blood glucose receiver (DEXCOM G6) device; Use as directed per manufacturer instructions to monitor blood glucose daily. For diagnosis of [dx diabetes:45011}  Dispense: 1 each; Refill: 0  - C-Peptide; Future  - Diabetes Autoimmune Profile; Future  - Basic Metabolic Panel; Future  - insulin glargine 100 UNIT/ML pen-injector; Inject 20 Units into the skin 2 (two) times daily  Dispense: 15 mL; Refill: 1        Plan:     t1DM  DM control is fair, goal A1c is 7%, last A1c 8.5%, lower than the time before that.   Complications: nephropathy, eGFR lower  Likely Type I diabetes, given no FH of DM, age of onset 84 yrs, likely type I diabetes but BMI- 34.3  Checking C peptide levels, Diabetes antibody profile ordered.   Medications- continue lantus 20U BID, if Type I, will add rapid acting insulin at the next visit.   Recommend CGM, ordering DEXCOM G6. Advised pt to let us know if needs help placing it, we can set up a nurse visit for that.   Recommend watching carb portions- limiting to 30 gms/meal and 10 gms/snack, no more than 1-2 snacks a day, if not able to count carbs, eating 1 carb per meal, making sure its the smallest thing on the plate, more  vegetables. Avoid sugar, sweet beverages, high glycemic index fruits.  Spent time discussing necessary dietary changes to improve the glucose control.   Role of exercise and its effect on glucose metabolism discussed- recommend biking, walking, light jog to help improve sugar levels.   Discussed the risk of complications if DM not under control. Stressed importance of diet and medication adherence for the glucose levels to improve.   Follow up in 4 wks.     Return in about 4 weeks (around 10/03/2022).  Time spent 45 mins    The time was spent in reviewing medical records and summarizing for patient, patient education and counseling, treatment and management of the above noted plan.  I reinforced key topics in the discussion and answered every question to the patient's satisfaction.      Signed by: Jerrye Bushy, MD  [facility]

## 2023-05-22 ENCOUNTER — Other Ambulatory Visit (INDEPENDENT_AMBULATORY_CARE_PROVIDER_SITE_OTHER): Payer: Self-pay | Admitting: "Endocrinology

## 2023-05-22 ENCOUNTER — Telehealth: Payer: Self-pay | Admitting: "Endocrinology

## 2023-05-22 DIAGNOSIS — E1069 Type 1 diabetes mellitus with other specified complication: Secondary | ICD-10-CM

## 2023-05-22 DIAGNOSIS — E119 Type 2 diabetes mellitus without complications: Secondary | ICD-10-CM

## 2023-05-22 MED ORDER — INSULIN GLARGINE 100 UNIT/ML SC SOPN
20.0000 [IU] | PEN_INJECTOR | Freq: Two times a day (BID) | SUBCUTANEOUS | 0 refills | Status: DC
Start: 2023-05-22 — End: 2023-07-01

## 2023-05-22 MED ORDER — DEXCOM G6 RECEIVER DEVI
0 refills | Status: AC
Start: 2023-05-22 — End: ?

## 2023-05-22 MED ORDER — DEXCOM G6 SENSOR MISC
0 refills | Status: DC
Start: 2023-05-22 — End: 2023-07-03

## 2023-05-22 MED ORDER — DEXCOM G6 TRANSMITTER MISC
0 refills | Status: DC
Start: 2023-05-22 — End: 2023-10-29

## 2023-05-22 NOTE — Telephone Encounter (Signed)
Recent Visits  Date Type Provider Dept   09/05/22 Telemedicine Visit Vinie Sill, MD [facility]   09/04/22 Office Visit Kittie Plater, FNP [facility]   Showing recent visits within past 540 days with a meds authorizing provider and meeting all other requirements  Future Appointments  No visits were found meeting these conditions.  Showing future appointments within next 150 days with a meds authorizing provider and meeting all other requirements      Due for follow up: 10/03/22    ICD:    1. Type 1 diabetes mellitus with other specified complication             Last lab:  09/04/22

## 2023-05-22 NOTE — Telephone Encounter (Signed)
Rx refill(s) sent.

## 2023-05-22 NOTE — Telephone Encounter (Signed)
Patient is calling in regards to     continuous blood glucose receiver (DEXCOM G6) device             continuous blood glucose sensor (DEXCOM G6) device            continuous blood glucose transmitter (DEXCOM G6) device          States they would rather have a test kit with strips, that is covered by insurance sent to     [id number] [place], [place] - [address] AT [facility] [address]  [address]  [place] [address]  Phone: [phone] Fax: [phone]\      Pt has been buying test strips out of pocket and looking to switch back to that system, covered by their plan.

## 2023-05-24 MED ORDER — GLUCOSE BLOOD VI STRP
ORAL_STRIP | 3 refills | Status: AC
Start: 2023-05-24 — End: 2024-06-23

## 2023-05-24 NOTE — Addendum Note (Signed)
Addended by: Vista Deck on: 05/24/2023 11:50 AM     Modules accepted: Orders

## 2023-06-26 ENCOUNTER — Encounter (INDEPENDENT_AMBULATORY_CARE_PROVIDER_SITE_OTHER): Payer: Self-pay | Admitting: "Endocrinology

## 2023-06-26 NOTE — Progress Notes (Signed)
 Recent Visits  Date Type Provider Dept   09/05/22 Telemedicine Visit Vinie Sill, MD [facility]   09/04/22 Office Visit Kittie Plater, FNP [facility]   Showing recent visits within past 540 days with a meds authorizing provi

## 2023-06-26 NOTE — Progress Notes (Addendum)
Patient failed to return per recommendation in February 2024 and has not been started for short-acting insulin and repeat A1c.

## 2023-06-26 NOTE — Telephone Encounter (Signed)
Patient verbalized he has never been prescribed with short-acting insulin since he was diagnosed with Type 1 DM, only Lantus. Confirmed appointment on 07/01/2023 at 11:20 am.

## 2023-07-01 ENCOUNTER — Encounter (INDEPENDENT_AMBULATORY_CARE_PROVIDER_SITE_OTHER): Payer: Self-pay | Admitting: "Endocrinology

## 2023-07-01 ENCOUNTER — Ambulatory Visit (INDEPENDENT_AMBULATORY_CARE_PROVIDER_SITE_OTHER): Payer: No Typology Code available for payment source | Admitting: "Endocrinology

## 2023-07-01 VITALS — BP 115/72 | HR 109 | Ht 77.01 in | Wt 312.8 lb

## 2023-07-01 DIAGNOSIS — E66812 Obesity, class 2: Secondary | ICD-10-CM

## 2023-07-01 DIAGNOSIS — E1069 Type 1 diabetes mellitus with other specified complication: Secondary | ICD-10-CM

## 2023-07-01 DIAGNOSIS — Z6837 Body mass index (BMI) 37.0-37.9, adult: Secondary | ICD-10-CM

## 2023-07-01 MED ORDER — INSULIN GLARGINE 100 UNIT/ML SC SOPN
25.0000 [IU] | PEN_INJECTOR | Freq: Two times a day (BID) | SUBCUTANEOUS | 1 refills | Status: AC
Start: 2023-07-01 — End: 2023-12-28

## 2023-07-01 MED ORDER — BD PEN NEEDLE NANO U/F 32G X 4 MM MISC
1 refills | Status: AC
Start: 2023-07-01 — End: ?

## 2023-07-01 MED ORDER — INSULIN ASPART 100 UNIT/ML SC SOPN
15.0000 [IU] | PEN_INJECTOR | Freq: Three times a day (TID) | SUBCUTANEOUS | 1 refills | Status: DC
Start: 2023-07-01 — End: 2023-11-21

## 2023-07-01 NOTE — Progress Notes (Signed)
 ENDOCRINE CONSULTATION NOTE    Date Time: 07/01/2023 12:36 PM  Patient Name: [facility]    Reason for Referral:     Chief Complaint   Patient presents with    Diabetes type 1        History of present Illness:   Aleksi Brummet  is a 22 y.o. year old male

## 2023-07-01 NOTE — Patient Instructions (Signed)
 It was a pleasure meeting you today and thank you for trusting [place] with your health.  Recommendations from today  Lantus 25U twice a day  Novolog   For meals like cream of wheat or rice, pasta- take 15U novolog, before the meal  If eating controlled carbs-

## 2023-07-01 NOTE — Progress Notes (Signed)
Filled diabetes education form, office visit note and labs sent over to [facility] Diabetes Education Department [phone]) for referral.

## 2023-07-02 LAB — BASIC METABOLIC PANEL
BUN / Creatinine Ratio: 8 — ABNORMAL LOW (ref 9–20)
BUN: 10 mg/dL (ref 6–20)
CO2: 21 mmol/L (ref 20–29)
Calcium: 10.3 mg/dL — ABNORMAL HIGH (ref 8.7–10.2)
Chloride: 95 mmol/L — ABNORMAL LOW (ref 96–106)
Creatinine: 1.28 mg/dL — ABNORMAL HIGH (ref 0.76–1.27)
Glucose: 408 mg/dL — ABNORMAL HIGH (ref 70–99)
Potassium: 4.8 mmol/L (ref 3.5–5.2)
Sodium: 136 mmol/L (ref 134–144)
eGFR: 82 mL/min/{1.73_m2} (ref >59)

## 2023-07-02 LAB — C-PEPTIDE: C-Peptide: 4.6 ng/mL — ABNORMAL HIGH (ref 1.1–4.4)

## 2023-07-02 LAB — HEMOGLOBIN A1C: Hemoglobin A1C: 14.7 % — ABNORMAL HIGH (ref 4.8–5.6)

## 2023-07-02 MED ORDER — METFORMIN HCL ER 500 MG PO TB24
500.0000 mg | ORAL_TABLET | Freq: Every morning | ORAL | 1 refills | Status: AC
Start: 2023-07-02 — End: 2023-12-29

## 2023-07-02 NOTE — Addendum Note (Signed)
 Addended by: Andres Labrum on: 07/02/2023 11:14 AM     Modules accepted: Orders

## 2023-07-03 ENCOUNTER — Other Ambulatory Visit (INDEPENDENT_AMBULATORY_CARE_PROVIDER_SITE_OTHER): Payer: Self-pay | Admitting: "Endocrinology

## 2023-07-03 DIAGNOSIS — E1069 Type 1 diabetes mellitus with other specified complication: Secondary | ICD-10-CM

## 2023-07-03 NOTE — Telephone Encounter (Signed)
 Rx refill(s) sent.

## 2023-07-04 LAB — URINE MICROALBUMIN, RANDOM
Creatinine, UR: 84 mg/dL
Microalb/Crt. Ratio: 13 mg/g{creat} (ref 0–29)
Microalbumin, UR: 11.2 ug/mL

## 2023-07-05 ENCOUNTER — Encounter (INDEPENDENT_AMBULATORY_CARE_PROVIDER_SITE_OTHER)
Payer: No Typology Code available for payment source | Admitting: Student in an Organized Health Care Education/Training Program

## 2023-07-19 LAB — DIABETES AUTOIMMUNE PROFILE (INSULIN, GAD-65, IA-2 & ZNT8 AN)
GAD-65 Antibody: <5 U/mL
IA-2 Autoantibodies: <7.5 U/mL
Insulin Abs, S: <5 uU/mL
ZnT8 Antibodies: <15 U/mL

## 2023-08-08 ENCOUNTER — Ambulatory Visit (INDEPENDENT_AMBULATORY_CARE_PROVIDER_SITE_OTHER): Payer: No Typology Code available for payment source | Admitting: "Endocrinology

## 2023-09-21 ENCOUNTER — Other Ambulatory Visit (INDEPENDENT_AMBULATORY_CARE_PROVIDER_SITE_OTHER): Payer: Self-pay | Admitting: "Endocrinology

## 2023-09-21 ENCOUNTER — Encounter (INDEPENDENT_AMBULATORY_CARE_PROVIDER_SITE_OTHER): Payer: Self-pay | Admitting: Student in an Organized Health Care Education/Training Program

## 2023-09-21 DIAGNOSIS — E1069 Type 1 diabetes mellitus with other specified complication: Secondary | ICD-10-CM

## 2023-09-23 MED ORDER — DEXCOM G6 SENSOR MISC
1 refills | Status: AC
Start: 2023-09-23 — End: ?

## 2023-09-23 NOTE — Telephone Encounter (Signed)
 Recent Visits  Date Type Provider Dept   07/01/23 Office Visit Frutoso Baltazar SAUNDERS, MD [facility]   09/05/22 Telemedicine Visit Frutoso Baltazar SAUNDERS, MD [facility]   Showing recent visits within past 540 days with a meds authorizing provider and meeting all other requirements  Future Appointments  No visits were found meeting these conditions.  Showing future appointments within next 150 days with a meds authorizing provider and meeting all other requirements      Due for follow up: 08/12/2023     ICD:    1. Type 1 diabetes mellitus with other specified complication             Last lab:  07/01/2023

## 2023-09-26 ENCOUNTER — Encounter (INDEPENDENT_AMBULATORY_CARE_PROVIDER_SITE_OTHER): Payer: Self-pay | Admitting: Internal Medicine

## 2023-09-26 ENCOUNTER — Telehealth (INDEPENDENT_AMBULATORY_CARE_PROVIDER_SITE_OTHER): Payer: No Typology Code available for payment source | Admitting: Internal Medicine

## 2023-09-26 DIAGNOSIS — Z6837 Body mass index (BMI) 37.0-37.9, adult: Secondary | ICD-10-CM

## 2023-09-26 DIAGNOSIS — E6609 Other obesity due to excess calories: Secondary | ICD-10-CM

## 2023-09-26 DIAGNOSIS — E66812 Obesity, class 2: Secondary | ICD-10-CM

## 2023-09-26 MED ORDER — SEMAGLUTIDE-WEIGHT MANAGEMENT 0.25 MG/0.5ML SC SOAJ
0.2500 mg | SUBCUTANEOUS | 0 refills | Status: AC
Start: 2023-09-26 — End: 2023-10-18

## 2023-09-26 NOTE — Progress Notes (Signed)
 [facility]   OFFICE VISIT            Consent: Verbal consent has been obtained from the patient to conduct this video visit encounter. The time spent in medical discussion during this visit was 6.5 minutes.      HPI     Chief Complaint   Patient presents with    Weight Loss     Zepbound  or Ozempic  rx for weight loss      HPI  Pleasant 23 year old male with a history of type 1 diabetes diagnosed in 2022 and obesity presents today for discussion of weight loss via telemedicine.  He is interested in an GLP-1 agonist.  Has never tried medications for weight loss before.  Has never been diagnosed with sleep apnea.  Would also like to discuss the potential side effects of GLP-1 agonists  ROS   Review of Systems   All other systems reviewed and are negative.      Vital Signs   There were no vitals taken for this visit.    Physical Exam   Physical Exam  Constitutional:       Appearance: Normal appearance.   Neurological:      General: No focal deficit present.      Mental Status: He is alert and oriented to person, place, and time.         Assessment/Plan     Assessment & Plan  Class 2 obesity due to excess calories without serious comorbidity with body mass index (BMI) of 37.0 to 37.9 in adult       Will prescribe Wegovy  0.25 mg once weekly.  Will escalate doses until we reach a maximum dose of 2.4.  We discussed potential side effects including nausea especially in the early dose escalation period.  Also discussed constipation issues and potential muscle loss as well.  Patient is agreeable to begin Wegovy .  Denies any history of sleep apnea.

## 2023-09-30 ENCOUNTER — Encounter (INDEPENDENT_AMBULATORY_CARE_PROVIDER_SITE_OTHER): Payer: Self-pay | Admitting: Internal Medicine

## 2023-10-29 ENCOUNTER — Other Ambulatory Visit (INDEPENDENT_AMBULATORY_CARE_PROVIDER_SITE_OTHER): Payer: Self-pay | Admitting: Internal Medicine

## 2023-10-29 ENCOUNTER — Other Ambulatory Visit (INDEPENDENT_AMBULATORY_CARE_PROVIDER_SITE_OTHER): Payer: Self-pay | Admitting: "Endocrinology

## 2023-10-29 DIAGNOSIS — E1069 Type 1 diabetes mellitus with other specified complication: Secondary | ICD-10-CM

## 2023-10-29 NOTE — Telephone Encounter (Signed)
 Copied from CRM [id number]. Topic: Clinical Support - Prescription Refill  >> Oct 29, 2023  5:33 PM Fernande I wrote:  [name] [facility] called about Clinical Support - Prescription Refill.  Additional details:  continuous blood glucose transmitter (DEXCOM G6) device  Pt needs a new prescription

## 2023-10-30 MED ORDER — DEXCOM G6 TRANSMITTER MISC
3 refills | Status: AC
Start: 2023-10-30 — End: ?

## 2023-10-30 NOTE — Telephone Encounter (Signed)
 Recent Visits  Date Type Provider Dept   07/01/23 Office Visit Frutoso Baltazar SAUNDERS, MD [facility]   09/05/22 Telemedicine Visit Frutoso Baltazar SAUNDERS, MD [facility]   Showing recent visits within past 540 days with a meds authorizing provider and meeting all other requirements  Future Appointments  No visits were found meeting these conditions.  Showing future appointments within next 150 days with a meds authorizing provider and meeting all other requirements      Due for follow up: 08/12/2023    ICD:    1. Type 1 diabetes mellitus with other specified complication (CMS/HCC)             Last lab:  07/01/2023

## 2023-10-30 NOTE — Telephone Encounter (Signed)
Addressed and sent

## 2023-11-07 ENCOUNTER — Telehealth (INDEPENDENT_AMBULATORY_CARE_PROVIDER_SITE_OTHER): Admitting: Internal Medicine

## 2023-11-21 ENCOUNTER — Encounter (INDEPENDENT_AMBULATORY_CARE_PROVIDER_SITE_OTHER): Payer: Self-pay | Admitting: Internal Medicine

## 2023-11-21 ENCOUNTER — Other Ambulatory Visit (INDEPENDENT_AMBULATORY_CARE_PROVIDER_SITE_OTHER): Payer: Self-pay | Admitting: "Endocrinology

## 2023-11-21 DIAGNOSIS — E66812 Obesity, class 2: Secondary | ICD-10-CM

## 2023-11-21 DIAGNOSIS — E1069 Type 1 diabetes mellitus with other specified complication: Secondary | ICD-10-CM

## 2023-11-22 MED ORDER — INSULIN ASPART 100 UNIT/ML SC SOPN
15.0000 [IU] | PEN_INJECTOR | Freq: Three times a day (TID) | SUBCUTANEOUS | 0 refills | Status: DC
Start: 2023-11-22 — End: 2023-12-12

## 2023-11-22 NOTE — Telephone Encounter (Signed)
 Recent Visits  Date Type Provider Dept   07/01/23 Office Visit Frutoso Baltazar SAUNDERS, MD [facility]   09/05/22 Telemedicine Visit Frutoso Baltazar SAUNDERS, MD [facility]   Showing recent visits within past 540 days with a meds authorizing provider and meeting all other requirements  Future Appointments  No visits were found meeting these conditions.  Showing future appointments within next 150 days with a meds authorizing provider and meeting all other requirements      Due for follow up: 08/12/2023    ICD:    1. Type 1 diabetes mellitus with other specified complication (CMS/HCC)             Last lab:  07/01/2023

## 2023-11-22 NOTE — Telephone Encounter (Signed)
 Rx refill(s) sent.

## 2023-11-22 NOTE — Progress Notes (Signed)
 Received fax from pharmacy but nothing in ePA.

## 2023-11-22 NOTE — Progress Notes (Signed)
 Called pharmacy regarding message below, spoke with lab tech. Caller stated since medication was prescribed in February, they need to run it again because it is not in their queue. Caller stated the medication needs a PA and can fax to office. Confirmed fax number. Awaiting fax from pharmacy.

## 2023-11-25 ENCOUNTER — Other Ambulatory Visit (INDEPENDENT_AMBULATORY_CARE_PROVIDER_SITE_OTHER): Payer: Self-pay | Admitting: Internal Medicine

## 2023-11-25 NOTE — Addendum Note (Signed)
 Addended by: Shaiden Aldous on: 11/25/2023 10:06 AM     Modules accepted: Orders

## 2023-11-25 NOTE — Progress Notes (Signed)
 Semaglutide  (Wegovy ) 0.25 MG/0.5ML injection was last prescribed 09/26/2023.     Last visit: [facility] on 09/26/2023 with Dr. Arlee Lace, "Will prescribe Wegovy  0.25 mg once weekly. Will escalate doses until we reach a maximum dose of 2.4".    Upcoming appointment: None    Labs: Hemoglobin A1c/BMP/Urine Microalbumin 06/2023    Patient is requesting we submit a PA for Wegovy , but per patient's chart, there is no current prescription for Wegovy . Is the patient due for a follow up appointment?

## 2023-11-25 NOTE — Telephone Encounter (Signed)
 Copied from CRM #[id number]. Topic: Clinical Support - Prescription Refill  >> Nov 25, 2023  2:02 PM Dipti G wrote:  CYRILL, LUCCHESE [facility] called about Clinical Support - Prescription Refill.    Additional details:    Pt called to check the prior authorization for Wegovy  .  Please advise...Aaron AasAaron Aas

## 2023-11-26 ENCOUNTER — Other Ambulatory Visit (INDEPENDENT_AMBULATORY_CARE_PROVIDER_SITE_OTHER): Payer: Self-pay | Admitting: Internal Medicine

## 2023-11-26 DIAGNOSIS — E119 Type 2 diabetes mellitus without complications: Secondary | ICD-10-CM

## 2023-11-26 DIAGNOSIS — E66812 Obesity, class 2: Secondary | ICD-10-CM

## 2023-11-26 MED ORDER — SEMAGLUTIDE-WEIGHT MANAGEMENT 0.25 MG/0.5ML SC SOAJ
0.2500 mg | SUBCUTANEOUS | 0 refills | Status: AC
Start: 2023-11-26 — End: 2023-12-18

## 2023-11-26 NOTE — Telephone Encounter (Signed)
 Pharmacy is requesting a call for a change in medication   Pt ID # [id number]

## 2023-11-26 NOTE — Telephone Encounter (Signed)
 Called pharmacy and stated PA is required fax was already sent and CMM request faxed to. Caller made aware nothing received and nothing in ePA. Caller stated may take some time since medication was sent to pharmacy today.     Received fax for a medication refill request and in comments states PA needed, no KEY included or in CMM yet. Awaiting for ePA request.

## 2023-11-29 ENCOUNTER — Encounter (INDEPENDENT_AMBULATORY_CARE_PROVIDER_SITE_OTHER): Payer: Self-pay | Admitting: Internal Medicine

## 2023-11-29 NOTE — Telephone Encounter (Signed)
 Per the denial letter, "drug not covered/plan exclusion".     Is there an alternative that may be prescribed? Or should we submit an appeal? Thank you.

## 2023-11-29 NOTE — Telephone Encounter (Signed)
 PA status: denied.     Denial letter scanned into patient chart for review.

## 2023-11-29 NOTE — Telephone Encounter (Signed)
 Nothing found in ePA. Made key in ePA for wegovy . Key: [id number].

## 2023-12-03 ENCOUNTER — Encounter (INDEPENDENT_AMBULATORY_CARE_PROVIDER_SITE_OTHER): Admitting: Internal Medicine

## 2023-12-04 NOTE — Telephone Encounter (Signed)
 Per PA denial letter. Appeal to be faxed. Printed last office visit note and A1C result for faxing. Documents handed to FD staff for faxing. Fax number can also be found on PA denial letter that was scanned into patient chart.

## 2023-12-05 ENCOUNTER — Encounter (INDEPENDENT_AMBULATORY_CARE_PROVIDER_SITE_OTHER): Payer: Self-pay

## 2023-12-12 ENCOUNTER — Other Ambulatory Visit (INDEPENDENT_AMBULATORY_CARE_PROVIDER_SITE_OTHER): Payer: Self-pay | Admitting: "Endocrinology

## 2023-12-12 DIAGNOSIS — E1069 Type 1 diabetes mellitus with other specified complication: Secondary | ICD-10-CM

## 2023-12-12 MED ORDER — INSULIN ASPART 100 UNIT/ML SC SOPN
15.0000 [IU] | PEN_INJECTOR | Freq: Three times a day (TID) | SUBCUTANEOUS | 0 refills | Status: AC
Start: 2023-12-12 — End: ?

## 2023-12-12 NOTE — Telephone Encounter (Signed)
 Recent Visits  Date Type Provider Dept   07/01/23 Office Visit Dorna Gasman, MD [facility]   09/05/22 Telemedicine Visit Dorna Gasman, MD [facility]   Showing recent visits within past 540 days with a meds authorizing provider and meeting all other requirements  Future Appointments  No visits were found meeting these conditions.  Showing future appointments within next 150 days with a meds authorizing provider and meeting all other requirements      Due for follow up: 08/12/23    ICD:    1. Type 1 diabetes mellitus with other specified complication (CMS/HCC)             Last lab:  07/01/2023

## 2024-01-01 ENCOUNTER — Telehealth (INDEPENDENT_AMBULATORY_CARE_PROVIDER_SITE_OTHER): Payer: Self-pay | Admitting: Internal Medicine

## 2024-01-01 NOTE — Telephone Encounter (Signed)
 Called patient regarding message below. Patient stated he wants to know if there are alternatives for getting wegovy  approved or maybe another medication that can be tried. Patient stated when he spoke with insurance they suggested he can try ozempic . Patient made aware can send a message to provider to send this to pharmacy. It is likely that a PA will need to be done again, but if insurance suggested this, we can try this. Patient requesting tpo try this medication. Please send medication to preferred pharmacy below. Patient made aware he will be notified once updates are available. Patient stated understanding.     [id number] [address], [place] - [address] AT [facility] [phone]

## 2024-01-01 NOTE — Telephone Encounter (Signed)
Pt would like a call back to discuss his medication.

## 2024-01-02 ENCOUNTER — Other Ambulatory Visit (INDEPENDENT_AMBULATORY_CARE_PROVIDER_SITE_OTHER): Payer: Self-pay | Admitting: Internal Medicine

## 2024-01-02 DIAGNOSIS — E66812 Obesity, class 2: Secondary | ICD-10-CM

## 2024-01-02 MED ORDER — SEMAGLUTIDE(0.25 OR 0.5MG/DOS) 2 MG/3ML SC SOPN
PEN_INJECTOR | SUBCUTANEOUS | 3 refills | Status: DC
Start: 2024-01-02 — End: 2024-02-07

## 2024-01-06 ENCOUNTER — Telehealth (INDEPENDENT_AMBULATORY_CARE_PROVIDER_SITE_OTHER): Payer: Self-pay | Admitting: Internal Medicine

## 2024-01-06 NOTE — Telephone Encounter (Signed)
 PA needed for Ozempic . Cover My Meds Key [id number]. Please initiate PA. Thank you.

## 2024-01-06 NOTE — Telephone Encounter (Signed)
 Pt said pharmacy need a code for the ozempic  asap and would like a callback once done

## 2024-01-07 ENCOUNTER — Telehealth: Payer: Self-pay | Admitting: Internal Medicine

## 2024-01-07 ENCOUNTER — Telehealth (INDEPENDENT_AMBULATORY_CARE_PROVIDER_SITE_OTHER): Payer: Self-pay

## 2024-01-07 ENCOUNTER — Encounter (INDEPENDENT_AMBULATORY_CARE_PROVIDER_SITE_OTHER): Payer: Self-pay | Admitting: Internal Medicine

## 2024-01-07 ENCOUNTER — Encounter (INDEPENDENT_AMBULATORY_CARE_PROVIDER_SITE_OTHER): Payer: Self-pay

## 2024-01-07 NOTE — Telephone Encounter (Addendum)
 Per patient's chart, patient has a diagnosis of Type 1 diabetes mellitus with other specified complication (CMS/HCC).

## 2024-01-07 NOTE — Telephone Encounter (Signed)
 Per the denial letter, Ozempic  is only covered if the patient has a diagnosis of Type 2 Diabetes.    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

## 2024-01-07 NOTE — Telephone Encounter (Signed)
(  Key: [id number])  Ozempic  PA pending approval

## 2024-01-07 NOTE — Telephone Encounter (Signed)
 PA was denied. PA denial note scanned into patient chart. Please review and advise, thank you.

## 2024-01-07 NOTE — Telephone Encounter (Signed)
 Pt called in to check on the status of PA. Per pt the pharmacy told him they directions on the ozempic  were not clear, that there were two different types of directions and the rx was send back to clarification.  Please advice. Thank you.

## 2024-01-07 NOTE — Telephone Encounter (Signed)
 Duplicate encounter

## 2024-01-07 NOTE — Telephone Encounter (Signed)
 Copied from CRM #[id number]. Topic: Clinical Support - Speak With Nurse  >> Jan 07, 2024  2:34 PM Cindia D wrote:  [name] [facility] called about Clinical Support - Speak With Nurse.  Additional details:    Pt is requesting a call back from PCP regarding Ozempic  medication. Writer advised that the PA status is pending approval.   Pt stated that medication was denied by Insurance again.    Please advise:  [phone]

## 2024-01-07 NOTE — Telephone Encounter (Signed)
 These are the correct directions. 0.25 mg weekly for 4 weeks, then 0.5 mg weekly

## 2024-01-07 NOTE — Telephone Encounter (Signed)
 PA was addended to include diagnosis of type 2 DM. Patient A1C was 14 6 months ago.    Key: [id number]

## 2024-01-08 ENCOUNTER — Encounter (INDEPENDENT_AMBULATORY_CARE_PROVIDER_SITE_OTHER): Payer: Self-pay | Admitting: Internal Medicine

## 2024-01-08 ENCOUNTER — Telehealth (INDEPENDENT_AMBULATORY_CARE_PROVIDER_SITE_OTHER): Payer: Self-pay | Admitting: Internal Medicine

## 2024-01-08 NOTE — Telephone Encounter (Signed)
 Copied from CRM [id number]. Topic: Clinical Support - Medical Question  >> Jan 08, 2024  3:50 PM Emmie ORN wrote:  [name] [facility] called about Clinical Support - Medical Question.  Additional details:    Pt calling in about denial of his medication, would like a call back at [phone]

## 2024-01-08 NOTE — Telephone Encounter (Signed)
 Duplicate encounter

## 2024-01-08 NOTE — Telephone Encounter (Signed)
Updated order sent to the pharmacy

## 2024-01-09 NOTE — Progress Notes (Signed)
 RN called [facility] at [phone] and spoke with Katheryn. 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    Per Lori, Victoza & Exenatide are plan exclusions, and Trulicity & Liraglutide need a PA but they are usually only covered for a diagnosis of type 2 diabetes.    RN was transferred to the Pharmacist, Vertell. Per Vertell, Wegovy  and Zepbound are plan exclusions and are not covered benefits. Per Vertell, oral medications, including Phentermine or Qysimia, are also plan exclusions. Per Vertell, Metformin  is a covered benefit.

## 2024-01-09 NOTE — Telephone Encounter (Signed)
 Please see additional encounter. No further action is needed. Thank you.

## 2024-01-21 ENCOUNTER — Other Ambulatory Visit (INDEPENDENT_AMBULATORY_CARE_PROVIDER_SITE_OTHER): Payer: Self-pay

## 2024-01-21 DIAGNOSIS — E1069 Type 1 diabetes mellitus with other specified complication: Secondary | ICD-10-CM

## 2024-01-21 MED ORDER — DEXCOM G6 SENSOR MISC
1.0000 | 0 refills | Status: AC
Start: 2024-01-21 — End: ?

## 2024-01-21 NOTE — Telephone Encounter (Signed)
 One time Rx authorized. Pt was advised by Dr. Frutoso before her departure he required an appt for continued refills. Was due for f/u December. Please request refills from PCP or please schedule visit with another endocrinologist to continue care with us . Thank you.

## 2024-01-21 NOTE — Progress Notes (Signed)
 Patient reports that he does not take Metformin , and per patient's insurance, this is the only medication that may be covered. Please advise if appropriate to send. This was last prescribed on 07/02/2023, 90 tablets, 1 refill, by Dr. Baltazar Pottier (Endocrinology).

## 2024-01-21 NOTE — Telephone Encounter (Signed)
 Recent Visits  Date Type Provider Dept   09/26/23 Telemedicine Visit Conard Lash, MD [facility]   09/04/22 Office Visit Emi Clarity, FNP [facility]   Showing recent visits within past 540 days with a meds authorizing provider and meeting all other requirements  Future Appointments  No visits were found meeting these conditions.  Showing future appointments within next 150 days with a meds authorizing provider and meeting all other requirements      Due for follow up: 08/12/23    ICD:    1. Type 1 diabetes mellitus with other specified complication (CMS/HCC)             Last lab:  06/2023

## 2024-01-22 ENCOUNTER — Encounter (INDEPENDENT_AMBULATORY_CARE_PROVIDER_SITE_OTHER): Payer: Self-pay

## 2024-01-24 ENCOUNTER — Telehealth (INDEPENDENT_AMBULATORY_CARE_PROVIDER_SITE_OTHER): Payer: Self-pay | Admitting: Internal Medicine

## 2024-01-24 DIAGNOSIS — E1069 Type 1 diabetes mellitus with other specified complication: Secondary | ICD-10-CM

## 2024-01-24 MED ORDER — INSULIN ASPART 100 UNIT/ML SC SOPN
15.0000 [IU] | PEN_INJECTOR | Freq: Three times a day (TID) | SUBCUTANEOUS | 0 refills | Status: DC
Start: 2024-01-24 — End: 2024-02-07

## 2024-01-24 NOTE — Telephone Encounter (Signed)
 Pt calling in reference to last TL informing him that he needed to see his ENDO before receiving his script. Please advise.    Preferred number [phone]

## 2024-01-24 NOTE — Addendum Note (Signed)
 Addended by: MEADE HAMMOND on: 01/24/2024 02:25 PM     Modules accepted: Orders

## 2024-01-24 NOTE — Telephone Encounter (Signed)
 Name, strength, directions of requested refill(s):    insulin  aspart (NovoLOG ) 100 UNIT/ML injection pen       How much medication is remaining: NONE    Pharmacy to send refill to or patient to pick up rx from office (mark requested pharmacy in BOLD):      [id number] [address], [place] - [address] AT [facility] [address]  [address]  [place] [address]  Phone: [phone] Fax: [phone]    [facility]  [address]  [place] [address]  Phone: [phone] Fax: [phone]        Please mark X next to the preferred call back number:    Mobile: [phone] (mobile)    Home: [phone] (home)    Work: @WORKPHONE @        Medication refill request, see above. Thank you   Patient has been informed that medication refill requests should be called in up to one week prior to running out of medication.    Additional Notes:    Next Visit: MM/DD/YYYY

## 2024-01-24 NOTE — Telephone Encounter (Signed)
 RN called patient at [phone] and spoke with patient. Patient has been scheduled for a VV with Dr. Meade on 01/27/2024 at 3:30 pm.

## 2024-01-24 NOTE — Telephone Encounter (Signed)
 Patient was supposed to follow up with Endo in December 2024.    Per patient message from 01/22/2024, patient was advised to schedule an appointment with another endocrinologist or to follow up with PCP.     RN called patient at [phone] to assist patient in scheduling an appointment. Patient did not answer, RN left a voicemail to call back.

## 2024-01-27 ENCOUNTER — Telehealth (INDEPENDENT_AMBULATORY_CARE_PROVIDER_SITE_OTHER): Admitting: Student in an Organized Health Care Education/Training Program

## 2024-01-27 ENCOUNTER — Encounter (INDEPENDENT_AMBULATORY_CARE_PROVIDER_SITE_OTHER): Payer: Self-pay | Admitting: Student in an Organized Health Care Education/Training Program

## 2024-01-27 DIAGNOSIS — E1069 Type 1 diabetes mellitus with other specified complication: Secondary | ICD-10-CM

## 2024-01-27 NOTE — Progress Notes (Signed)
 [facility] [place]         Telehealth:  The Patient has given verbal consent for delivery of health care via telehealth.   The patient is located at Home in [place]   The encounter provider is located at their Medical Office in [place]   Epic Video Client was utilized for Real Time/Synchronous Telehealth.           Subjective     Chief Complaint   Patient presents with    Diabetes Follow-up     Advised to upload BS levels      History of Present Illness  [name] is a 23 year old male with type 1 diabetes who presents for an insulin  refill.    He has not seen his endocrinologist since late last year, and he is currently without an endocrinologist as his previous one has left the practice. He does not have a new appointment set up with another endocrinologist.    He adjusts his insulin  dosage based on his blood glucose levels and dietary intake. Initially, a dose of 15 units was ineffective, leading him to increase his dosage to 25-30 units, which he continues to use. He takes insulin  glargine once daily at night. Since increasing his dosage, his blood glucose levels have improved, with fasting blood glucose ranging from 130 to 170 mg/dL. Postprandial blood glucose levels are typically in the 150 mg/dL range.    He has experienced a few episodes of hypoglycemia. He is currently using Novolog  insulin , and there is a concern about potential insurance coverage issues for this medication, though no official notification has been received.  Review of Systems    Objective     There were no vitals taken for this visit.  Physical Exam  Physical Exam  Vitals not performed due to nature of visit   Pulmonary: Not dyspneic, able to speak in full sentences   Neurological: Alert and oriented to person, place, and time.   Psychiatric: Normal mood. Behavior is normal.       Results  LABS  Fasting blood glucose: 130 mg/dL  Postprandial blood glucose: 150 mg/dL      Assessment/Plan     Assessment & Plan  Type 1  Diabetes Mellitus  Type 1 Diabetes Mellitus managed with insulin  glargine. Fasting glucose levels remain above target. Referral to endocrinologist needed for ongoing management.  - Refer to endocrinologist for diabetes management.  - Fill insulin  prescription as a one-time courtesy.  - Advise contacting previous clinic to establish care with a new endocrinologist.  - Schedule appointment with PCP for chronic care management     Weight Management  Interest in weight loss medications to be addressed with PCP    Verbal consent obtained to record this visit.

## 2024-01-28 ENCOUNTER — Telehealth (HOSPITAL_BASED_OUTPATIENT_CLINIC_OR_DEPARTMENT_OTHER): Payer: Self-pay

## 2024-01-28 NOTE — Telephone Encounter (Signed)
 Chat sent to [name] to schedule

## 2024-01-28 NOTE — Telephone Encounter (Signed)
 Referring Provider:  Meade Feliciano LABOR, MD   Stated Diagnosis: Type 1 diabetes mellitus with other specified complication     Clinical Summary:   Imaging - Findings: -     Current Medications: Novolog . Dexcom, Ozempic ?, Metformin ?, insulin  glargine     Key Labs:    Latest Reference Range & Units 07/01/23 00:00   C-Peptide 1.1 - 4.4 ng/mL 4.6 (H)   GAD-65 Antibody U/mL <5.0   Hemoglobin A1C 4.8 - 5.6 % 14.7 (H)   ZnT8 Antibodies U/mL <15   (H): Data is abnormally high    Red Flags / Urgent Indicators:  none    Notes:  TOC Dr Frutoso      Scheduling Recommendation  []  Urgent (within 14 days)  []  Urgent (within 30 days)  [x]  Moderate (1-3 months)  []  Routine   []  MD only  [x]  NP OK(approval to see KB)

## 2024-02-05 ENCOUNTER — Encounter (HOSPITAL_BASED_OUTPATIENT_CLINIC_OR_DEPARTMENT_OTHER): Payer: Self-pay

## 2024-02-07 ENCOUNTER — Telehealth (INDEPENDENT_AMBULATORY_CARE_PROVIDER_SITE_OTHER): Admitting: Family Nurse Practitioner

## 2024-02-07 ENCOUNTER — Telehealth (HOSPITAL_BASED_OUTPATIENT_CLINIC_OR_DEPARTMENT_OTHER): Payer: Self-pay

## 2024-02-07 ENCOUNTER — Encounter (HOSPITAL_BASED_OUTPATIENT_CLINIC_OR_DEPARTMENT_OTHER): Payer: Self-pay | Admitting: Family Nurse Practitioner

## 2024-02-07 VITALS — Ht 77.0 in | Wt 300.0 lb

## 2024-02-07 DIAGNOSIS — Z6837 Body mass index (BMI) 37.0-37.9, adult: Secondary | ICD-10-CM

## 2024-02-07 DIAGNOSIS — Z1389 Encounter for screening for other disorder: Secondary | ICD-10-CM

## 2024-02-07 DIAGNOSIS — Z978 Presence of other specified devices: Secondary | ICD-10-CM

## 2024-02-07 DIAGNOSIS — E66812 Obesity, class 2: Secondary | ICD-10-CM

## 2024-02-07 DIAGNOSIS — E1165 Type 2 diabetes mellitus with hyperglycemia: Secondary | ICD-10-CM

## 2024-02-07 MED ORDER — TRESIBA FLEXTOUCH 200 UNIT/ML SC SOPN
PEN_INJECTOR | SUBCUTANEOUS | 11 refills | Status: DC
Start: 2024-02-07 — End: 2024-06-23

## 2024-02-07 MED ORDER — MOUNJARO 2.5 MG/0.5ML SC SOAJ
2.5000 mg | SUBCUTANEOUS | 0 refills | Status: DC
Start: 2024-02-07 — End: 2024-06-12

## 2024-02-07 MED ORDER — INSULIN ASPART 100 UNIT/ML SC SOPN
20.0000 [IU] | PEN_INJECTOR | Freq: Three times a day (TID) | SUBCUTANEOUS | 11 refills | Status: DC
Start: 2024-02-07 — End: 2024-04-14

## 2024-02-07 NOTE — Progress Notes (Signed)
 Date Time: @IPTODAYDATE @ 3:00 PM  Patient Name: [facility]  Attending Physician: No att. providers found    Date: 02/07/2024 3:00 PM   Patient ID: [name] is a 23 y.o. male.  Chief Complaint:     Chief Complaint   Patient presents with    Diabetes     NEW PATIENT   A1c: 14.7% (11.11.24)      History of Present Illness:   [name] is a 23 y.o. male with a Body mass index is 35.57 kg/m. and a history of diabetes Mellitus  who presents for  management of diabetes mellitus   Visit type: initial eval - existing diagnosis   Ref: Meade Feliciano LABOR, MD    Diabetes History   Type: Type II and insulin  requiring  Current symptoms: none  Complications: without complications  Control: significantly elevated at 14.7%  Comorbid illness: obesity    Pertinent History/Interval:      Last follow-up:   AT LAST VISIT:   PLAN==>>    TODAY   Patient present w/ for establishment of care and management of type 2 Diabetes Mellitus   He was diagnosed in 2022. He started out on Metformin    He stopped taking Metformin  2/2 starting insulin  2023    At present he is taking Lantus  25 units BID + Novolog  25 units with meals. He also uses it for correction. He will see the numbers and sometimes will jsut take 10 units==> SELF CORRECT     He has a Dexcom G6     His last A1c was quite high at 14.7%     Diabetes Medication Regimen:  Daily basal insulin  dosing: Lantus  25 units BID  Daily bolus insulin  dosing: Novolog  25 units w/ meals   Lab Results   Component Value Date    HGBA1C 14.7 (H) 07/01/2023       Previous Medications:   Metformin  (started insulin )     Protection Meds:   ACE-I/ARB: No   Statin:   No   No Myalgias,  Muscle aches, body aches. No hx abnormal liver test related to statin therapy     Self-Monitoring Blood Glucose:  Uses CGM: Yes [x]   No []   OR Checks Blood Sugars: Yes []   No []   Home Glucose Readings:   SE CGM  Hypoglycemia: Yes []   No []     24 hour Meals Recall:  Diet: poor compliance with American  Dietetic Association (ADA) diet  Breakfast : banana or skip   Lunch :  Sandwich  Dinner :  Salads, Rice, Steak, Pasta   Snack :  not much   Beverages: Water  Gatorade   Exercise: none    Diabetes Complications:  Ophthalmology Diabetic Screening: last retinal exam was >1 year ago  Ophthalmology: no retinopathy, no glaucoma, no cataracts,  Podiatric Diabetic Screening: last podiatry exam was >1 year ago  Podiatry/Neuropathy denies LE burning, tingling, or loss of sensation  Do you Have Podiatry: Yes []   No []      Nephropathy  Yes []   No []    Renal: Yes []   No []  No        PROBLEM LIST   Problem List[1]  CURRENT MEDICATIONS   Medications Taking[2]   ALLERGIES   Allergies[3]  PAST MEDICAL HISTORY   Medical History[4]  PAST SURGICAL HISTORY   Past Surgical History[5]  FAMILY HISTORY   Family History[6]  SOCIAL HISTORY   Social History[7]     ROS     ROS:  A complete 12 point ROS was obtained. Pertinent positives and negatives as noted in HPI. All other systems are negative.     General/Constitutional:  Denies Chills. Denies Fatigue. Denies Fever.   Ophthalmologic: Denies Blurred vision. Denies Eye Pain.   ENT: Denies Nasal Discharge. Denies Ear pain. Denies Sinus pain.   Endocrine: Denies Polydipsia. Denies Polyuria.   Respiratory: Denies Cough. Denies Orthopnea. Denies Shortness of breath. Denies Wheezing.   Cardiovascular: Denies Chest pain. Denies Chest pain with exertion. Denies Leg Claudication. Denies Palpitations. Denies Swelling in hands/feet.   Gastrointestinal: Denies Abdominal pain. Denies Blood in stool. Denies Constipation. Denies Diarrhea.Denies Heartburn. Denies Nausea. Denies Vomiting.   Genitourinary: Denies Blood in urine. Denies Frequent urination. Denies Painful urination.   Musculoskeletal: Denies Leg cramps. Denies Muscle aches.   Skin: Denies Skin lesion(s).   Neurologic: Denies Dizziness. Denies Gait abnormality. Denies Headache. Denies Tingling/Numbness.     Objective/Physical Exam       TELE-MEDICINE APPOINTMENT; PATIENT HAS NO S/S of DISTRESS ON CAMERA    GENERAL: alert, in no acute distress, well developed, well nourished,oriented to time, place, and person.   HEENT: Normocephalic EYES: No injection. Visual acuity is grossly normal. EARS: external ears normal. NOSE: external nose normal without rhinorrhea THROAT: normal appearance, clear.   OROPHARYNX: moist mucus membranes, no tonsillar hypertrophy/exudate, uvula midline and pharynx non-erythematous, lips, teeth and gums are without obvious lesion.   NECK/THYROID: Full ROM, No cervical lymphadenopathy, No thyromegaly.    LUNGS: normal effort/no distress, breathing non-labored and no grunting/flaring/retractions  HEART: equal chest rise with normal respiratory effort  EXTREMITIES: no clubbing, cyanosis, or edema B/L.   SKIN: Warm, well perfused. No rashes; Acanthosis on posterior neck and bilateral axillae  PSYCH: Alert, appropriately interactive for age.  PODIATRIC:  Last foot exam ; Never had   LABS     Lab Results   Component Value Date    WBC 5.90 09/04/2022    HGB 15.0 09/04/2022    HCT 43.5 09/04/2022    RBC 5.35 09/04/2022    PLT 243 09/04/2022    MCV 81.3 09/04/2022    MCH 28.0 09/04/2022    MCHC 34.5 09/04/2022    RDW 12 09/04/2022    MPV 10.2 09/04/2022     Lab Results   Component Value Date    NA 136 07/01/2023    K 4.8 07/01/2023    CL 95 (L) 07/01/2023    CO2 21 07/01/2023    GLU 408 (H) 07/01/2023    BUN 10 07/01/2023    CREAT 1.28 (H) 07/01/2023    CA 10.3 (H) 07/01/2023    ANIONGAP 9.0 09/04/2022    EGFR 82 07/01/2023    AST 33 09/04/2022    ALT 45 09/04/2022    ALKPHOS 67 09/04/2022    ALB 3.8 09/04/2022    PROT 7.0 09/04/2022    GLOB 3.2 09/04/2022    AGRATIO 1.2 09/04/2022    BILITOTAL 0.3 09/04/2022     No results found for: CHOL, TRIG, HDL, LDL, LABVLDL, CHOLHDLRATIO  No results found for: TSH, T3, T4, T4FREE, T3TOTAL, TPOAB, TSIG, TRAB, TGAB  No results found for: TESTOSTERFRE,  TESTOSTERONE, DHEA, PROLACTIN, FSH, LH, ESTRADIOL)  No results found for: DHEA, PROLACTIN, FSH, LH, ESTRADIOL  No results found for: VITD, B12, FOLATE, IRON, TIBC, FERRITINS  No results found for: VITD, 25HYDROXYDTO  No results found for: PTT  Lab Results   Component Value Date    ALT 45 09/04/2022    AST  33 09/04/2022    ALKPHOS 67 09/04/2022    BILITOTAL 0.3 09/04/2022     Lab Results   Component Value Date    URMICROALB 14.0 09/04/2022    URMICROALB 6 09/04/2022     Lab Results   Component Value Date    MICROALB 11.2 07/01/2023      Lab Results   Component Value Date    HGBA1C 14.7 (H) 07/01/2023      TRENDS     Lab Results   Component Value Date    HGBA1C 14.7 (H) 07/01/2023    HGBA1C 8.5 (A) 09/04/2022    HGBA1C 9.5 (H) 07/07/2022     No results found for: CHOL  No results found for: HDL  No results found for: LDL  No results found for: TRIG  No results found for: LDL  In Office/POCT Labs      Results       None          RAD's   No results found.  CGM/PUMP/DATA/MEDIA               Vitals   Ht 1.956 m (6' 5)   Wt 136.1 kg (300 lb)   BMI 35.57 kg/m          BP Readings from Last 3 Encounters:   07/01/23 115/72   09/04/22 136/68   07/19/22 129/73      Last 3 Weights for the past 72 hrs (Last 3 readings):   Weight   02/07/24 0907 136.1 kg (300 lb)      Wt Readings from Last 4 Encounters:   02/07/24 0907 136.1 kg (300 lb)   07/01/23 1116 (!) 141.9 kg (312 lb 12.8 oz)   09/05/22 1317 131.5 kg (290 lb)   07/19/22 0903 139.3 kg (307 lb)      Assessment/Plan:   [name] is a 23 y.o. male with a Body mass index is 35.57 kg/m. and a history of diabetes Mellitus  who presents for  management of diabetes mellitus   Visit type: initial eval - existing diagnosis   Ref: Meade Feliciano LABOR, MD   Latest Reference Range & Units 07/07/22 11:29 09/04/22 15:35 07/01/23 00:00   C-Peptide 1.1 - 4.4 ng/mL   4.6 (H)   GAD-65 Antibody U/mL   <5.0   Hemoglobin A1C 4.8 - 5.6 %  9.5 (H) 8.5 ! 14.7 (H)   ZnT8 Antibodies U/mL   <15     Results       None           23 year old male with longstanding history of type 2 diabetes mellitus w/ extremely high A1c's   At present he is on basal bolus insulin      CGM show consistent highs. 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    Will try switching him to a more concentrate insulin        # Type 2 Diabetes Mellitus   Continuous Glucose Sensor:  DexCom G6 CGM: Cont  STOP Lantus  25 BID  START Tresiba 30 units every morning   START Tresiba  10 units at bedtime   START Mounjaro 2.5 mg weekly   Cut down to Novolog  20 units w/ meals  Advised to check sugars 4 times daily and return with blood sugar log/meter; if not on CGM   Discussed risk of hypoglycemia and prevention  Counseling was provided today on  etiology of DM, the effects of hyperglycemia, and the need for A1c reduction to prevent worsening diabetes and it complications     Retinopathy surveillance is due.  Recommend follow-up with ophthalmologist.  Podiatry surveillance is due.  Recommend follow-up with podiatry.    Patient is checking blood sugar 5x/day   Patient is on multiple daily dose injections of insulin , injecting insulin  5x/day  Patient is making insulin  dosing adjustments multiple times a day based on blood sugars & dosing scale provided by HCP  Patient has multiple hospitalizations for 2 admissions for significant hypoglycemia in   CGM is needed to adjust insulin  and make more informed dosing decisions    # Obesity      09/05/2022 07/01/2023 02/07/2024   Weight Monitoring   Height 195.6 cm 195.6 cm 195.6 cm   Weight 131.543 kg 141.885 kg 136.079 kg   BMI (calculated) 34.5 kg/m2  37.1 kg/m2 35.6 kg/m2       Data saved with a previous flowsheet row definition      He does have obesity putting him at risk for numerous metabolic, physical and psychosocial complications   Such as other cardiovascular risk factors, worsening DM, and kidney  disease  He is interested on GLP-1's   He denies a personal or family history of MENS type 2 or MTC.   Recommend therapeutic lifestyle changes which include optimizing low carbohydrate diet and aerobic exercise efforts.  Recommend adding the following DM medication class to the current regimen - GLP-1 receptor agonist. Side effects of GLP-1 receptor agonist (headache, nausea, diarrhea, urticaria, pancreatitis) discussed with patient.  Counseling on Eeiminate fast food, sodas or sugar sweetened beverages, reduce carbohydrates, refined sugars, processed foods, increase protein (emphasis on plant based proteins if possible), reduce saturated fats and red meats  Start Mounjaro 2.5 mg weekly     # Microalbuminuria  Ratio & Random  Lab Results   Component Value Date    URMICROALB 14.0 09/04/2022    URMICROALB 6 09/04/2022     Lab Results   Component Value Date    GLU 408 (H) 07/01/2023    BUN 10 07/01/2023    CREAT 1.28 (H) 07/01/2023    EGFR 82 07/01/2023     Microalbumin testing is due.  Urine microalbumin/Cr ratio ordered.   Recommend optimizing therapeutic lifestyle changes which include obtaining at least 150 minutes of aerobic exercise per week and eating a heart healthy diet ( i.e. DASH diet - www.heart.org or PopSteam.is ).    Ht 1.956 m (6' 5)   Wt 136.1 kg (300 lb)   BMI 35.57 kg/m   BP Readings from Last 3 Encounters:   07/01/23 115/72   09/04/22 136/68   07/19/22 129/73     Follow-up:   Return in about 2 weeks (around 02/21/2024).  1. Uncontrolled type 2 diabetes mellitus with hyperglycemia (CMS/HCC)  - Ambulatory referral to Ophthalmology; Future  - Referral to Podiatry ([place]); Future  - Hemoglobin A1C; Future  - C-Peptide; Future  - GAD-65,Insulin  Autoantibody; Future  - Hemoglobin A1C  - C-Peptide  - GAD-65,Insulin  Autoantibody  - Comprehensive Metabolic Panel; Future  - CBC with Differential (Order); Future  - CBC with Differential (Order)  - Lipid Panel; Future  - Lipid Panel  - Urine  Microalbumin, Random; Future  - tirzepatide (Mounjaro) 2.5 MG/0.5ML injection; Inject 2.5 mg into the skin once a week  Dispense: 2 mL; Refill: 0  - Insulin  Degludec (Tresiba FlexTouch) 200 UNIT/ML Solution Pen-injector; Inject 30 Units into  the skin once every morning AND 10 Units once at bedtime.  Dispense: 6 mL; Refill: 11  - insulin  aspart (NovoLOG ) 100 UNIT/ML injection pen; Inject 20 Units into the skin 3 (three) times daily before meals  Dispense: 15 mL; Refill: 11    2. Class 2 severe obesity due to excess calories with serious comorbidity and body mass index (BMI) of 37.0 to 37.9 in adult  - Comprehensive Metabolic Panel; Future  - Lipid Panel; Future  - Lipid Panel  - tirzepatide (Mounjaro) 2.5 MG/0.5ML injection; Inject 2.5 mg into the skin once a week  Dispense: 2 mL; Refill: 0  - Insulin  Degludec (Tresiba FlexTouch) 200 UNIT/ML Solution Pen-injector; Inject 30 Units into the skin once every morning AND 10 Units once at bedtime.  Dispense: 6 mL; Refill: 11    3. Screening for nephropathy  - Urine Microalbumin, Random; Future     This visit is being conducted via video and telephone.   Verbal consent has been obtained from the patient to conduct a video and telephone: yes   Time spent in discussion: 40 minutes      *A total of 40 minutes includes previsit review of patient's chart and medical records, visit discussion, patient education and counseling, post visit documentation of assessment and plan, placement of lab orders, imaging, referrals, prescriptions and coordination of care. >50% were in was spent discussing and counseling the patient on the various evaluative and therapeutic options, as discussed above.      *This note was generated by the Epic EMR system/ Dragon speech recognition and may contain inherent errors or omissions not intended by the user. Grammatical errors, random word insertions, deletions, pronoun errors and incomplete sentences are occasional consequences of this technology due  to software limitations. Not all errors are caught or corrected. If there are questions or concerns about the content of this note or information contained within the body of this dictation they should be addressed directly with the author for clarification.*          [1]   Patient Active Problem List  Diagnosis    Acute intractable headache, unspecified headache type   [2]   Outpatient Medications Marked as Taking for the 02/07/24 encounter (Telemedicine Visit) with Aida Muskrat, FNP   Medication Sig Dispense Refill    continuous blood glucose receiver (DEXCOM G6) device Use as directed per manufacturer instructions to monitor blood glucose daily. For diagnosis of [dx diabetes:45011} 1 each 0    continuous blood glucose sensor (DEXCOM G6) device Inject 1 Device into the skin every 10 (ten) days USEA S DIRECTED 9 each 0    continuous blood glucose transmitter (DEXCOM G6) device Use as directed per manufacturer instructions to monitor blood glucose daily. Change every 3 months. For diagnosis of [dx diabetes:45011} E10.69 1 each 3    glucose blood test strip Use to monitor blood glucose 6x daily, Accu Chek. 600 strip 3    Insulin  Pen Needle (BD Pen Needle Nano U/F) 32G X 4 MM Misc Use as directed daily 400 each 1    [DISCONTINUED] insulin  aspart (NovoLOG ) 100 UNIT/ML injection pen Inject 15 Units into the skin 3 (three) times daily before meals (Patient taking differently: Inject 15 Units into the skin 3 (three) times daily before meals 25 units TID) 15 mL 0    [DISCONTINUED] insulin  glargine 100 UNIT/ML pen-injector Inject 25 Units into the skin 2 (two) times daily (Patient taking differently: Inject 25 Units into the skin 2 (two) times  daily At night) 45 mL 1   [3] No Known Allergies  [4]   Past Medical History:  Diagnosis Date    DM (diabetes mellitus) (CMS/HCC)     H/O blood clots 2022    Type 1 diabetes mellitus (CMS/HCC)     Type 2 diabetes mellitus (CMS/HCC)    [5]   Past Surgical History:  Procedure  Laterality Date    ENDOSCOPIC SINUS SURGERY (FESS) Right 07/11/2022    Procedure: ENDOSCOPIC SINUS SURGERY (FESS) Right subtotal ethmoidectomy with frontal sinusotomy, Right sphenoidectomy with removal of content, right inferior turbinate outfracture and reduction, Right nasopharyngeal biopsy;  Surgeon: Romayne Barren, MD;  Location: [facility];  Service: ENT;  Laterality: Right;   [6]   Family History  Problem Relation Name Age of Onset    Diabetes Maternal Grandfather      Colon cancer Neg Hx      Prostate cancer Neg Hx      Heart attack Neg Hx      Heart disease Neg Hx      Stroke Neg Hx     [7]   Social History  Tobacco Use    Smoking status: Never     Passive exposure: Never    Smokeless tobacco: Never   Vaping Use    Vaping status: Never Used   Substance Use Topics    Alcohol use: Never    Drug use: Never

## 2024-02-07 NOTE — Telephone Encounter (Signed)
 PA initiated: [id number]    Drug: Mounjaro 2.5   Status: Your PA has been resolved, no additional PA is required. For further inquiries please contact the number on the back of the member prescription card.     PA initiated: [facility]    Drug: [place] [id number]  Status: Your PA has been resolved, no additional PA is required. For further inquiries please contact the number on the back of the member prescription card

## 2024-02-07 NOTE — Patient Instructions (Addendum)
 Hello     You saw Charles Durand, NP on 02/07/2024 for management of diabetes mellitus   The following issues were addressed:  AIC Goals .   Plan Going Forward     Continuous Glucose Sensor:  DexCom G6 CGM: Cont  STOP Lantus  25 BID  START Tresiba 30 units every morning   START Tresiba  10 units at bedtime   START Mounjaro 2.5 mg weekly   Cut down to Novolog  20 units w/ meals  If you are on a CGM; Continue close monitoring   Test and verify with a fingerstick all low blood sugars < 60 mg/dl or highs >649 mg seen on CGM   Contact Endocrinology for blood sugars < 70 mg/dl and persistent high's of > 300 mg/dl   Dietary and Lifestyle modification:  Exercises & Food Intake  Limit intake of refined carbohydrates; such bread, rice, and pasta, refined sugars and sugar sweetened beverages   Labs are due:      Follow up:  2 weeks

## 2024-02-10 ENCOUNTER — Other Ambulatory Visit: Payer: Self-pay

## 2024-02-10 NOTE — Progress Notes (Signed)
 Benefits Investigation Summary  Medication: Tirzepatide [place])     Prescription: New Therapy    Benefits Outcome: Paid     Dispensing pharmacy: [id number] - [place], [place] - [address] AT [facility] [address]     Estimated Rx Copay:  $30     Additional Notes: No PA required        :

## 2024-02-10 NOTE — Progress Notes (Signed)
 The prescription has been received by the Pharmacy Care Team. A benefit investigation is currently in process.    To follow up on the outcome of the benefit investigation, please check Episodes, Referrals or specialty pharmacy encounter section in the Patient chart review

## 2024-02-20 ENCOUNTER — Encounter (HOSPITAL_BASED_OUTPATIENT_CLINIC_OR_DEPARTMENT_OTHER): Payer: Self-pay

## 2024-02-25 ENCOUNTER — Ambulatory Visit (HOSPITAL_BASED_OUTPATIENT_CLINIC_OR_DEPARTMENT_OTHER): Admitting: Family Nurse Practitioner

## 2024-03-04 ENCOUNTER — Ambulatory Visit (INDEPENDENT_AMBULATORY_CARE_PROVIDER_SITE_OTHER): Admitting: Internal Medicine

## 2024-03-08 ENCOUNTER — Encounter (HOSPITAL_BASED_OUTPATIENT_CLINIC_OR_DEPARTMENT_OTHER): Payer: Self-pay | Admitting: Family Nurse Practitioner

## 2024-03-08 DIAGNOSIS — E1165 Type 2 diabetes mellitus with hyperglycemia: Secondary | ICD-10-CM

## 2024-03-08 DIAGNOSIS — Z1389 Encounter for screening for other disorder: Secondary | ICD-10-CM

## 2024-03-10 ENCOUNTER — Other Ambulatory Visit (HOSPITAL_BASED_OUTPATIENT_CLINIC_OR_DEPARTMENT_OTHER): Payer: Self-pay | Admitting: Family Nurse Practitioner

## 2024-03-10 ENCOUNTER — Encounter (HOSPITAL_BASED_OUTPATIENT_CLINIC_OR_DEPARTMENT_OTHER): Payer: Self-pay | Admitting: Family Nurse Practitioner

## 2024-03-10 DIAGNOSIS — Z1389 Encounter for screening for other disorder: Secondary | ICD-10-CM

## 2024-03-10 DIAGNOSIS — E66812 Morbid (severe) obesity due to excess calories: Secondary | ICD-10-CM

## 2024-03-10 DIAGNOSIS — E1165 Type 2 diabetes mellitus with hyperglycemia: Secondary | ICD-10-CM

## 2024-03-10 DIAGNOSIS — E1069 Type 1 diabetes mellitus with other specified complication: Secondary | ICD-10-CM

## 2024-03-10 DIAGNOSIS — Z978 Presence of other specified devices: Secondary | ICD-10-CM

## 2024-03-10 NOTE — Telephone Encounter (Signed)
Pt no showed last appt

## 2024-03-11 ENCOUNTER — Other Ambulatory Visit: Payer: Self-pay

## 2024-03-11 MED ORDER — MOUNJARO 5 MG/0.5ML SC SOAJ
5.0000 mg | SUBCUTANEOUS | 0 refills | Status: DC
Start: 2024-03-11 — End: 2024-04-14

## 2024-03-11 NOTE — Addendum Note (Signed)
 Addended by: TRUDY CRANE A on: 03/11/2024 03:15 PM     Modules accepted: Orders

## 2024-03-11 NOTE — Addendum Note (Signed)
 Addended by: TRUDY CRANE A on: 03/11/2024 03:21 PM     Modules accepted: Orders

## 2024-03-11 NOTE — Progress Notes (Signed)
 Pt requests refill for Mounjaro  5mg     Last office visit: 6.20.25  Next office visit: 7.29.25  Labs: TBD     Rx pended for [employer].

## 2024-03-11 NOTE — Addendum Note (Signed)
 Addended by: Crystallynn Noorani on: 03/11/2024 02:54 PM     Modules accepted: Orders

## 2024-03-13 ENCOUNTER — Other Ambulatory Visit: Payer: Self-pay

## 2024-03-13 NOTE — Progress Notes (Signed)
 The prescription has been received by the Pharmacy Care Team. A benefit investigation is currently in process.    To follow up on the outcome of the benefit investigation, please check Episodes, Referrals or specialty pharmacy encounter section in the Patient chart review

## 2024-03-13 NOTE — Telephone Encounter (Signed)
 Nanetta NP wrote a prescription for Mounjaro  5mg  sent on 7.23.25

## 2024-03-13 NOTE — Progress Notes (Signed)
 Benefits Investigation Summary  Medication: Tirzepatide  (Mounjaro )     Prescription: Renewal    Benefits Outcome: Paid     Dispensing pharmacy: [id number] - [place], [place] - [address] AT [facility] [address]     Estimated Rx Copay:  $30     Additional Notes: No PA required; Prefer local pharmacy        :

## 2024-03-17 ENCOUNTER — Other Ambulatory Visit (INDEPENDENT_AMBULATORY_CARE_PROVIDER_SITE_OTHER)

## 2024-03-17 ENCOUNTER — Telehealth (INDEPENDENT_AMBULATORY_CARE_PROVIDER_SITE_OTHER): Admitting: Family Nurse Practitioner

## 2024-03-17 ENCOUNTER — Encounter (HOSPITAL_BASED_OUTPATIENT_CLINIC_OR_DEPARTMENT_OTHER): Payer: Self-pay | Admitting: Family Nurse Practitioner

## 2024-03-17 DIAGNOSIS — E1165 Type 2 diabetes mellitus with hyperglycemia: Secondary | ICD-10-CM

## 2024-03-17 DIAGNOSIS — Z978 Presence of other specified devices: Secondary | ICD-10-CM

## 2024-03-17 DIAGNOSIS — E66812 Obesity, class 2: Secondary | ICD-10-CM

## 2024-03-17 DIAGNOSIS — Z1389 Encounter for screening for other disorder: Secondary | ICD-10-CM

## 2024-03-17 DIAGNOSIS — Z6837 Body mass index (BMI) 37.0-37.9, adult: Secondary | ICD-10-CM

## 2024-03-17 NOTE — Patient Instructions (Addendum)
 Hello     You saw Nanetta Durand, NP on 03/17/2024 for management of diabetes mellitus   The following issues were addressed:  AIC Goals .   Plan Going Forward     Continuous Glucose Sensor:  DexCom G6 CGM: Cont  Take Tresiba  40 units every morning   STOP Tresiba   10 units at bedtime   Increase to  Mounjaro  5 mg weekly   Cut down to Novolog  15 units w/ meals  If you are on a CGM; Continue close monitoring   Test and verify with a fingerstick all low blood sugars < 60 mg/dl or highs >649 mg seen on CGM   Contact Endocrinology for blood sugars < 70 mg/dl and persistent high's of > 300 mg/dl   Dietary and Lifestyle modification:  Exercises & Food Intake  Limit intake of refined carbohydrates; such bread, rice, and pasta, refined sugars and sugar sweetened beverages   Labs are due:      Follow up:  2 weeks

## 2024-03-17 NOTE — Progress Notes (Unsigned)
 Date Time: @IPTODAYDATE @ 1:51 PM  Patient Name: [name]  Attending Physician: No att. providers found    Date: 03/17/2024 1:51 PM   Patient ID: [name] is a 23 y.o. male.  Chief Complaint:     No chief complaint on file.     History of Present Illness:   [name] is a 23 y.o. male with a Body mass index is 38.54 kg/m. and a history of diabetes Mellitus  who presents for  management of diabetes mellitus   Visit type: FOLLOW UP   Ref:      Diabetes History   Type: Type II and insulin  requiring  Current symptoms: none  Complications: without complications  Control: significantly elevated at 14.7%  Comorbid illness: obesity    Pertinent History/Interval:      Last follow-up: 02/07/2024   AT LAST VISIT:   PLAN==>> STOP Lantus  25 BID  START Tresiba  30 units every morning   START Tresiba   10 units at bedtime   START Mounjaro  2.5 mg weekly   Cut down to Novolog  20 units w/ meals    TODAY   He is on the above regimen. Also on CGM He has a Dexcom G6   His last A1c was quite high at 14.7%   Blood sugars have improved.    Diabetes Medication Regimen:  Daily basal insulin  dosing: Lantus  25 units BID  Daily bolus insulin  dosing: Novolog  25 units w/ meals   Lab Results   Component Value Date    HGBA1C 14.7 (H) 07/01/2023       Previous Medications:   Metformin  (started insulin )     Protection Meds:   ACE-I/ARB: No   Statin:   No   No Myalgias,  Muscle aches, body aches. No hx abnormal liver test related to statin therapy     Self-Monitoring Blood Glucose:  Uses CGM: Yes [x]   No []   OR Checks Blood Sugars: Yes []   No []   Home Glucose Readings:   SE CGM  Hypoglycemia: Yes []   No []     24 hour Meals Recall:  Diet: poor compliance with American Dietetic Association (ADA) diet  Breakfast : banana or skip   Lunch :  Sandwich  Dinner :  Salads, Rice, Steak, Pasta   Snack :  not much   Beverages: Water  Gatorade   Exercise: none    Diabetes Complications:  Ophthalmology Diabetic Screening: last retinal exam  was >1 year ago  Ophthalmology: no retinopathy, no glaucoma, no cataracts,  Podiatric Diabetic Screening: last podiatry exam was >1 year ago  Podiatry/Neuropathy denies LE burning, tingling, or loss of sensation  Do you Have Podiatry: Yes []   No []      Nephropathy  Yes []   No []    Renal: Yes []   No []  No        PROBLEM LIST   Problem List[1]  CURRENT MEDICATIONS   Medications Taking[2]   ALLERGIES   Allergies[3]  PAST MEDICAL HISTORY   Medical History[4]  PAST SURGICAL HISTORY   Past Surgical History[5]  FAMILY HISTORY   Family History[6]  SOCIAL HISTORY   Social History[7]     ROS     ROS: A complete 12 point ROS was obtained. Pertinent positives and negatives as noted in HPI. All other systems are negative.     General/Constitutional:  Denies Chills. Denies Fatigue. Denies Fever.   Ophthalmologic: Denies Blurred vision. Denies Eye Pain.   ENT: Denies Nasal Discharge. Denies Ear pain. Denies  Sinus pain.   Endocrine: Denies Polydipsia. Denies Polyuria.   Respiratory: Denies Cough. Denies Orthopnea. Denies Shortness of breath. Denies Wheezing.   Cardiovascular: Denies Chest pain. Denies Chest pain with exertion. Denies Leg Claudication. Denies Palpitations. Denies Swelling in hands/feet.   Gastrointestinal: Denies Abdominal pain. Denies Blood in stool. Denies Constipation. Denies Diarrhea.Denies Heartburn. Denies Nausea. Denies Vomiting.   Genitourinary: Denies Blood in urine. Denies Frequent urination. Denies Painful urination.   Musculoskeletal: Denies Leg cramps. Denies Muscle aches.   Skin: Denies Skin lesion(s).   Neurologic: Denies Dizziness. Denies Gait abnormality. Denies Headache. Denies Tingling/Numbness.     Objective/Physical Exam      TELE-MEDICINE APPOINTMENT; PATIENT HAS NO S/S of DISTRESS ON CAMERA    GENERAL: alert, in no acute distress, well developed, well nourished,oriented to time, place, and person.   HEENT: Normocephalic EYES: No injection. Visual acuity is grossly normal. EARS: external ears  normal. NOSE: external nose normal without rhinorrhea THROAT: normal appearance, clear.   OROPHARYNX: moist mucus membranes, no tonsillar hypertrophy/exudate, uvula midline and pharynx non-erythematous, lips, teeth and gums are without obvious lesion.   NECK/THYROID: Full ROM, No cervical lymphadenopathy, No thyromegaly.    LUNGS: normal effort/no distress, breathing non-labored and no grunting/flaring/retractions  HEART: equal chest rise with normal respiratory effort  EXTREMITIES: no clubbing, cyanosis, or edema B/L.   SKIN: Warm, well perfused. No rashes; Acanthosis on posterior neck and bilateral axillae  PSYCH: Alert, appropriately interactive for age.  PODIATRIC:  Last foot exam ; Never had   LABS     Lab Results   Component Value Date    WBC 5.90 09/04/2022    HGB 15.0 09/04/2022    HCT 43.5 09/04/2022    RBC 5.35 09/04/2022    PLT 243 09/04/2022    MCV 81.3 09/04/2022    MCH 28.0 09/04/2022    MCHC 34.5 09/04/2022    RDW 12 09/04/2022    MPV 10.2 09/04/2022     Lab Results   Component Value Date    NA 136 07/01/2023    K 4.8 07/01/2023    CL 95 (L) 07/01/2023    CO2 21 07/01/2023    GLU 408 (H) 07/01/2023    BUN 10 07/01/2023    CREAT 1.28 (H) 07/01/2023    CA 10.3 (H) 07/01/2023    ANIONGAP 9.0 09/04/2022    EGFR 82 07/01/2023    AST 33 09/04/2022    ALT 45 09/04/2022    ALKPHOS 67 09/04/2022    ALB 3.8 09/04/2022    PROT 7.0 09/04/2022    GLOB 3.2 09/04/2022    AGRATIO 1.2 09/04/2022    BILITOTAL 0.3 09/04/2022     No results found for: CHOL, TRIG, HDL, LDL, LABVLDL, CHOLHDLRATIO  No results found for: TSH, T3, T4, T4FREE, T3TOTAL, TPOAB, TSIG, TRAB, TGAB  No results found for: TESTOSTERFRE, TESTOSTERONE, DHEA, PROLACTIN, FSH, LH, ESTRADIOL)  No results found for: DHEA, PROLACTIN, FSH, LH, ESTRADIOL  No results found for: VITD, B12, FOLATE, IRON, TIBC, FERRITINS  No results found for: VITD, 25HYDROXYDTO  No results found for: PTT  Lab  Results   Component Value Date    ALT 45 09/04/2022    AST 33 09/04/2022    ALKPHOS 67 09/04/2022    BILITOTAL 0.3 09/04/2022     Lab Results   Component Value Date    URMICROALB 14.0 09/04/2022    URMICROALB 6 09/04/2022     Lab Results   Component Value Date  MICROALB 11.2 07/01/2023      Lab Results   Component Value Date    HGBA1C 14.7 (H) 07/01/2023      TRENDS     Lab Results   Component Value Date    HGBA1C 14.7 (H) 07/01/2023    HGBA1C 8.5 (A) 09/04/2022    HGBA1C 9.5 (H) 07/07/2022     No results found for: CHOL  No results found for: HDL  No results found for: LDL  No results found for: TRIG  No results found for: LDL  In Office/POCT Labs      Results       None          RAD's   No results found.  CGM/PUMP/DATA/MEDIA               Vitals   Ht 1.956 m (6' 5)   Wt (!) 147.4 kg (325 lb)   BMI 38.54 kg/m          BP Readings from Last 3 Encounters:   07/01/23 115/72   09/04/22 136/68   07/19/22 129/73      Last 3 Weights for the past 72 hrs (Last 3 readings):   Weight   03/17/24 1342 (!) 147.4 kg (325 lb)      Wt Readings from Last 4 Encounters:   03/17/24 1342 (!) 147.4 kg (325 lb)   02/07/24 0907 136.1 kg (300 lb)   07/01/23 1116 (!) 141.9 kg (312 lb 12.8 oz)   09/05/22 1317 131.5 kg (290 lb)      Assessment/Plan:   Ellwood Steidle is a 23 y.o. male with a Body mass index is 38.54 kg/m. and a history of diabetes Mellitus  who presents for  management of diabetes mellitus   Visit type: FOLLOW   Ref:     Latest Reference Range & Units 07/07/22 11:29 09/04/22 15:35 07/01/23 00:00   C-Peptide 1.1 - 4.4 ng/mL   4.6 (H)   GAD-65 Antibody U/mL   <5.0   Hemoglobin A1C 4.8 - 5.6 % 9.5 (H) 8.5 ! 14.7 (H)   ZnT8 Antibodies U/mL   <15     Results       None           23 year old male with longstanding history of type 2 diabetes mellitus w/ extremely high A1c's   His basal bolus regimen was adjusted.  And he was started on GLP-1    Since that time blood sugars have improved.  CGM shows a GMI of  6.3%    Plan:  Will increase GLP-1 and cut down on mealtime insulin     # Type 2 Diabetes Mellitus   Continuous Glucose Sensor:  DexCom G6 CGM: Cont  Take Tresiba  40 units every morning   STOP Tresiba   10 units at bedtime   Increase to  Mounjaro  5 mg weekly   Cut down to Novolog  15 units w/ meals  Advised to check sugars 4 times daily and return with blood sugar log/meter; if not on CGM   Discussed risk of hypoglycemia and prevention  Counseling was provided today on etiology of DM, the effects of hyperglycemia, and the need for A1c reduction to prevent worsening diabetes and it complications   Retinopathy surveillance is due.  Recommend follow-up with ophthalmologist.  Podiatry surveillance is due.  Recommend follow-up with podiatry.    Patient is checking blood sugar 5x/day   Patient is on multiple daily dose injections of insulin , injecting insulin   5x/day  Patient is making insulin  dosing adjustments multiple times a day based on blood sugars & dosing scale provided by HCP  Patient has multiple hospitalizations for 2 admissions for significant hypoglycemia in   CGM is needed to adjust insulin  and make more informed dosing decisions    # Obesity      07/01/2023 02/07/2024 03/17/2024   Weight Monitoring   Height 195.6 cm 195.6 cm 195.6 cm   Weight 141.885 kg 136.079 kg 147.419 kg   BMI (calculated) 37.1 kg/m2 35.6 kg/m2 38.5 kg/m2      He does have obesity putting him at risk for numerous metabolic, physical and psychosocial complications   Such as other cardiovascular risk factors, worsening DM, and kidney disease  He is interested on GLP-1's   He denies a personal or family history of MENS type 2 or MTC.   Continue to eliminate fast food, sodas or sugar sweetened beverages, reduce carbohydrates, refined sugars, processed foods, increase protein (emphasis on plant based proteins if possible), reduce saturated fats and red meats  Increase to  Mounjaro  5 mg weekly      # Microalbuminuria  Ratio & Random  Lab Results    Component Value Date    URMICROALB 14.0 09/04/2022    URMICROALB 6 09/04/2022     Lab Results   Component Value Date    GLU 408 (H) 07/01/2023    BUN 10 07/01/2023    CREAT 1.28 (H) 07/01/2023    EGFR 82 07/01/2023     Microalbumin testing is due.  Urine microalbumin/Cr ratio ordered.   Recommend optimizing therapeutic lifestyle changes which include obtaining at least 150 minutes of aerobic exercise per week and eating a heart healthy diet ( i.e. DASH diet - www.heart.org or PopSteam.is ).    Ht 1.956 m (6' 5)   Wt (!) 147.4 kg (325 lb)   BMI 38.54 kg/m   BP Readings from Last 3 Encounters:   07/01/23 115/72   09/04/22 136/68   07/19/22 129/73     Follow-up:   No follow-ups on file.  1. Uncontrolled type 2 diabetes mellitus with hyperglycemia (CMS/HCC)  - Follow Up In Endocrinology    2. Class 2 severe obesity due to excess calories with serious comorbidity and body mass index (BMI) of 37.0 to 37.9 in adult  - Follow Up In Endocrinology    3. Screening for nephropathy  - Follow Up In Endocrinology    4. Uses self-applied continuous glucose monitoring device  - Follow Up In Endocrinology     This visit is being conducted via video and telephone.   Verbal consent has been obtained from the patient to conduct a video and telephone: yes   Time spent in discussion: 40 minutes      *A total of 40 minutes includes previsit review of patient's chart and medical records, visit discussion, patient education and counseling, post visit documentation of assessment and plan, placement of lab orders, imaging, referrals, prescriptions and coordination of care. >50% were in was spent discussing and counseling the patient on the various evaluative and therapeutic options, as discussed above.      *This note was generated by the Epic EMR system/ Dragon speech recognition and may contain inherent errors or omissions not intended by the user. Grammatical errors, random word insertions, deletions, pronoun errors and  incomplete sentences are occasional consequences of this technology due to software limitations. Not all errors are caught or corrected. If there are questions or concerns about the  content of this note or information contained within the body of this dictation they should be addressed directly with the author for clarification.*          [1]   Patient Active Problem List  Diagnosis    Acute intractable headache, unspecified headache type   [2]   Outpatient Medications Marked as Taking for the 03/17/24 encounter (Telemedicine Visit) with Aida Muskrat, FNP   Medication Sig Dispense Refill    continuous blood glucose receiver (DEXCOM G6) device Use as directed per manufacturer instructions to monitor blood glucose daily. For diagnosis of [dx diabetes:45011} 1 each 0    continuous blood glucose sensor (DEXCOM G6) device Inject 1 Device into the skin every 10 (ten) days USEA S DIRECTED 9 each 0    continuous blood glucose transmitter (DEXCOM G6) device Use as directed per manufacturer instructions to monitor blood glucose daily. Change every 3 months. For diagnosis of [dx diabetes:45011} E10.69 1 each 3    glucose blood test strip Use to monitor blood glucose 6x daily, Accu Chek. 600 strip 3    insulin  aspart (NovoLOG ) 100 UNIT/ML injection pen Inject 20 Units into the skin 3 (three) times daily before meals 15 mL 11    Insulin  Degludec (Tresiba  FlexTouch) 200 UNIT/ML Solution Pen-injector Inject 30 Units into the skin once every morning AND 10 Units once at bedtime. 6 mL 11    Insulin  Pen Needle (BD Pen Needle Nano U/F) 32G X 4 MM Misc Use as directed daily 400 each 1    tirzepatide  (Mounjaro ) 2.5 MG/0.5ML injection Inject 2.5 mg into the skin once a week 2 mL 0   [3] No Known Allergies  [4]   Past Medical History:  Diagnosis Date    DM (diabetes mellitus) (CMS/HCC)     H/O blood clots 2022    Type 1 diabetes mellitus (CMS/HCC)     Type 2 diabetes mellitus (CMS/HCC)    [5]   Past Surgical History:  Procedure  Laterality Date    ENDOSCOPIC SINUS SURGERY (FESS) Right 07/11/2022    Procedure: ENDOSCOPIC SINUS SURGERY (FESS) Right subtotal ethmoidectomy with frontal sinusotomy, Right sphenoidectomy with removal of content, right inferior turbinate outfracture and reduction, Right nasopharyngeal biopsy;  Surgeon: Romayne Barren, MD;  Location: [facility];  Service: ENT;  Laterality: Right;   [6]   Family History  Problem Relation Name Age of Onset    Diabetes Maternal Grandfather      Colon cancer Neg Hx      Prostate cancer Neg Hx      Heart attack Neg Hx      Heart disease Neg Hx      Stroke Neg Hx     [7]   Social History  Tobacco Use    Smoking status: Never     Passive exposure: Never    Smokeless tobacco: Never   Vaping Use    Vaping status: Never Used   Substance Use Topics    Alcohol use: Yes     Alcohol/week: 0.0 - 1.0 standard drinks of alcohol    Drug use: Never

## 2024-03-21 LAB — CBC AND DIFFERENTIAL
Baso(Absolute): 47 {cells}/uL (ref 0–200)
Basophils Automated: 0.6 %
Eosinophils Absolute: 371 {cells}/uL (ref 15–500)
Eosinophils Automated: 4.7 %
Hematocrit: 50.6 % — ABNORMAL HIGH (ref 38.5–50.0)
Hemoglobin: 16.7 g/dL (ref 13.2–17.1)
Lymphocytes Absolute: 2607 {cells}/uL (ref 850–3900)
Lymphocytes Automated: 33 %
MCH: 28 pg (ref 27.0–33.0)
MCHC: 33 g/dL (ref 32.0–36.0)
MCV: 84.9 fL (ref 80.0–100.0)
MPV: 10.4 fL (ref 7.5–12.5)
Monocytes Absolute: 521 {cells}/uL (ref 200–950)
Monocytes: 6.6 %
Neutrophils Absolute Count: 4353 {cells}/uL (ref 1500–7800)
Neutrophils: 55.1 %
Platelets: 227 Thousand/uL (ref 140–400)
RBC: 5.96 Million/uL — ABNORMAL HIGH (ref 4.20–5.80)
RDW: 12.9 % (ref 11.0–15.0)
WBC: 7.9 Thousand/uL (ref 3.8–10.8)

## 2024-03-21 LAB — COMPREHENSIVE METABOLIC PANEL
ALT: 73 U/L — ABNORMAL HIGH (ref 9–46)
AST (SGOT): 42 U/L — ABNORMAL HIGH (ref 10–40)
Albumin/Globulin Ratio: 1.7 (calc) (ref 1.0–2.5)
Albumin: 4.5 g/dL (ref 3.6–5.1)
Alkaline Phosphatase: 52 U/L (ref 36–130)
BUN / Creatinine Ratio: 8 (calc) (ref 6–22)
BUN: 10 mg/dL (ref 7–25)
Bilirubin, Total: 0.4 mg/dL (ref 0.2–1.2)
CO2: 27 mmol/L (ref 20–32)
Calcium: 9.6 mg/dL (ref 8.6–10.3)
Chloride: 103 mmol/L (ref 98–110)
Creatinine: 1.29 mg/dL — ABNORMAL HIGH (ref 0.60–1.24)
Globulin: 2.7 g/dL (ref 1.9–3.7)
Glucose: 124 mg/dL — ABNORMAL HIGH (ref 65–99)
Potassium: 4.2 mmol/L (ref 3.5–5.3)
Protein, Total: 7.2 g/dL (ref 6.1–8.1)
Sodium: 140 mmol/L (ref 135–146)
eGFR: 81 mL/min/1.73m2 (ref >=60)

## 2024-03-21 LAB — C-PEPTIDE: C-Peptide: 5.02 ng/mL — ABNORMAL HIGH (ref 0.80–3.85)

## 2024-03-21 LAB — LIPID PANEL
Cholesterol / HDL Ratio: 3.6 (calc) (ref <5.0)
Cholesterol: 143 mg/dL (ref <200)
HDL: 40 mg/dL (ref >=40)
LDL Calculated: 77 mg/dL
NON HDL CHOLESTEROL: 103 mg/dL (ref <130)
Triglycerides: 161 mg/dL — ABNORMAL HIGH (ref <150)

## 2024-03-21 LAB — HEMOGLOBIN A1C: Hemoglobin A1C: 8.4 % — ABNORMAL HIGH (ref <5.7)

## 2024-03-21 LAB — URINE MICROALBUMIN, RANDOM
Microalbumin/Creatinine Ratio: 4 mg/g{creat} (ref <30)
Microalbumin: 0.7 mg/dL
Urine Creatinine, Random: 187 mg/dL (ref 20–320)

## 2024-03-21 LAB — GLUTAMIC ACID DECARBOXYLASE-65 (GAD65) ANTIBODY: GAD65 Antibody Assay: <5 [IU]/mL (ref <5)

## 2024-03-26 ENCOUNTER — Encounter (HOSPITAL_BASED_OUTPATIENT_CLINIC_OR_DEPARTMENT_OTHER): Payer: Self-pay | Admitting: Family Nurse Practitioner

## 2024-03-26 ENCOUNTER — Other Ambulatory Visit (HOSPITAL_BASED_OUTPATIENT_CLINIC_OR_DEPARTMENT_OTHER): Payer: Self-pay | Admitting: Family Nurse Practitioner

## 2024-03-26 DIAGNOSIS — E1165 Type 2 diabetes mellitus with hyperglycemia: Secondary | ICD-10-CM

## 2024-04-08 ENCOUNTER — Encounter (HOSPITAL_BASED_OUTPATIENT_CLINIC_OR_DEPARTMENT_OTHER): Payer: Self-pay | Admitting: Family Nurse Practitioner

## 2024-04-08 DIAGNOSIS — E1069 Type 1 diabetes mellitus with other specified complication: Secondary | ICD-10-CM

## 2024-04-08 MED ORDER — EMBECTA PEN NEEDLE NANO 32G X 4 MM MISC
3 refills | Status: AC
Start: 2024-04-08 — End: ?

## 2024-04-14 ENCOUNTER — Other Ambulatory Visit (HOSPITAL_BASED_OUTPATIENT_CLINIC_OR_DEPARTMENT_OTHER): Payer: Self-pay | Admitting: Family Nurse Practitioner

## 2024-04-14 ENCOUNTER — Encounter (HOSPITAL_BASED_OUTPATIENT_CLINIC_OR_DEPARTMENT_OTHER): Payer: Self-pay | Admitting: Family Nurse Practitioner

## 2024-04-14 ENCOUNTER — Other Ambulatory Visit: Payer: Self-pay

## 2024-04-14 DIAGNOSIS — E1165 Type 2 diabetes mellitus with hyperglycemia: Secondary | ICD-10-CM

## 2024-04-14 DIAGNOSIS — Z6837 Body mass index (BMI) 37.0-37.9, adult: Secondary | ICD-10-CM

## 2024-04-14 MED ORDER — MOUNJARO 5 MG/0.5ML SC SOAJ
5.0000 mg | SUBCUTANEOUS | 1 refills | Status: AC
Start: 2024-04-14 — End: 2024-06-09

## 2024-04-14 NOTE — Progress Notes (Signed)
 Benefits Investigation Summary  Medication: Tirzepatide  (Mounjaro )     Prescription: Renewal    Benefits Outcome: Refill Too Soon ([facility] until 8/28, last fill 8/7 at [facility])     Dispensing pharmacy:   [id number] - [place], [place] - [address] AT [facility] [address] [facility]       Estimated Rx Copay:  N/A     Additional Notes: N/A        :

## 2024-04-14 NOTE — Telephone Encounter (Signed)
 Pt requests refill for Mounjaro  5mg     LOV: 02-2024  NOV:  TBD  LABS: 02-2024    Provider recommended: Increase to  Mounjaro  5 mg weekly     Rx sent per request.

## 2024-04-14 NOTE — Telephone Encounter (Signed)
 Pharmacy requests refill for Novolog     LOV: 02-2024  NOV: TBD  LABS: 02-2024    Provider recommended: Cut down to Novolog  15 units w/ meals    Rx pended for provider review

## 2024-04-14 NOTE — Progress Notes (Signed)
 The prescription has been received by the Pharmacy Care Team. A benefit investigation is currently in process.    To follow up on the outcome of the benefit investigation, please check Episodes, Referrals or specialty pharmacy encounter section in the Patient chart review

## 2024-04-17 ENCOUNTER — Telehealth (HOSPITAL_BASED_OUTPATIENT_CLINIC_OR_DEPARTMENT_OTHER): Payer: Self-pay

## 2024-04-17 DIAGNOSIS — E1165 Type 2 diabetes mellitus with hyperglycemia: Secondary | ICD-10-CM

## 2024-04-17 MED ORDER — NOVOLOG FLEXPEN 100 UNIT/ML SC SOPN
15.0000 [IU] | PEN_INJECTOR | Freq: Three times a day (TID) | SUBCUTANEOUS | 3 refills | Status: DC
Start: 2024-04-17 — End: 2025-04-25

## 2024-04-17 NOTE — Telephone Encounter (Signed)
 Called patient pharmacy to discus insulin  aspart. Stated the pharmacy did not have the rx. Resent as requested.

## 2024-04-21 MED ORDER — FIASP FLEXTOUCH 100 UNIT/ML SC SOPN
15.0000 [IU] | PEN_INJECTOR | Freq: Three times a day (TID) | SUBCUTANEOUS | 4 refills | Status: DC
Start: 2024-04-21 — End: 2024-05-07

## 2024-04-21 NOTE — Telephone Encounter (Signed)
 PA initiated: [id number]    Drug: Insulin  Aspart  Status: USE FIASP  NOVOLOG .NON-FORMULARY DRUG

## 2024-04-21 NOTE — Addendum Note (Signed)
 Addended by: AIDA MUSKRAT on: 04/21/2024 02:21 PM     Modules accepted: Orders

## 2024-04-21 NOTE — Addendum Note (Signed)
 Addended by: TRUDY CRANE A on: 04/21/2024 10:47 AM     Modules accepted: Orders

## 2024-04-23 ENCOUNTER — Encounter (HOSPITAL_BASED_OUTPATIENT_CLINIC_OR_DEPARTMENT_OTHER): Payer: Self-pay | Admitting: Family Nurse Practitioner

## 2024-05-04 ENCOUNTER — Encounter (HOSPITAL_BASED_OUTPATIENT_CLINIC_OR_DEPARTMENT_OTHER): Payer: Self-pay | Admitting: Family Nurse Practitioner

## 2024-05-07 ENCOUNTER — Other Ambulatory Visit (HOSPITAL_BASED_OUTPATIENT_CLINIC_OR_DEPARTMENT_OTHER): Payer: Self-pay | Admitting: Family Nurse Practitioner

## 2024-05-07 ENCOUNTER — Telehealth: Payer: Self-pay | Admitting: Family Nurse Practitioner

## 2024-05-07 ENCOUNTER — Encounter (INDEPENDENT_AMBULATORY_CARE_PROVIDER_SITE_OTHER): Payer: Self-pay | Admitting: Internal Medicine

## 2024-05-07 DIAGNOSIS — E1165 Type 2 diabetes mellitus with hyperglycemia: Secondary | ICD-10-CM

## 2024-05-07 MED ORDER — NOVOLOG FLEXPEN 100 UNIT/ML SC SOPN
5.0000 [IU] | PEN_INJECTOR | Freq: Three times a day (TID) | SUBCUTANEOUS | 3 refills | Status: DC
Start: 2024-05-07 — End: 2024-05-07

## 2024-05-07 MED ORDER — NOVOLOG FLEXPEN 100 UNIT/ML SC SOPN
15.0000 [IU] | PEN_INJECTOR | Freq: Three times a day (TID) | SUBCUTANEOUS | 3 refills | Status: DC
Start: 2024-05-07 — End: 2024-06-23

## 2024-05-07 NOTE — Telephone Encounter (Signed)
 MA called the pharmacy to confirm they got they Rx. Pharmacy informed MA that they did receive it and will work on it today.

## 2024-05-07 NOTE — Addendum Note (Signed)
 Addended by: AIDA MUSKRAT on: 05/07/2024 05:12 PM     Modules accepted: Orders

## 2024-05-07 NOTE — Telephone Encounter (Signed)
 Novolog  has been sent to the pharmacy per pt request

## 2024-05-07 NOTE — Telephone Encounter (Signed)
 MA sent a secure message to [facility] NP for her awareness. Nanetta NP has sent a rx for Novolog .

## 2024-05-07 NOTE — Telephone Encounter (Addendum)
 Copied from CRM #[id number]. Topic: Clinical Support - Speak With Nurse  >> May 07, 2024  1:42 PM Verneita NOVAK wrote:  [name] [facility] called about Clinical Support - Speak With Nurse.  Additional details: Patient's insulin  was changed and he wanted to know why. Patient is out of medication and would like to stay on his previous medication. He would like to speak with the nurse.

## 2024-05-07 NOTE — Telephone Encounter (Signed)
 RN re-sent Novolog  as brand, per insurance request

## 2024-05-07 NOTE — Telephone Encounter (Signed)
 Hello  Please advise that Novolog  Flex pen 15 units with meals have been sent to the pharmacy

## 2024-05-07 NOTE — Addendum Note (Signed)
 Addended by: CLARITA SHUCK on: 05/07/2024 04:00 PM     Modules accepted: Orders

## 2024-05-08 NOTE — Telephone Encounter (Signed)
Ok thanks so much 

## 2024-05-18 ENCOUNTER — Other Ambulatory Visit (INDEPENDENT_AMBULATORY_CARE_PROVIDER_SITE_OTHER): Payer: Self-pay | Admitting: "Endocrinology

## 2024-05-18 ENCOUNTER — Encounter (INDEPENDENT_AMBULATORY_CARE_PROVIDER_SITE_OTHER): Payer: Self-pay | Admitting: Student in an Organized Health Care Education/Training Program

## 2024-05-18 DIAGNOSIS — E1069 Type 1 diabetes mellitus with other specified complication: Secondary | ICD-10-CM

## 2024-05-20 ENCOUNTER — Encounter (HOSPITAL_BASED_OUTPATIENT_CLINIC_OR_DEPARTMENT_OTHER): Payer: Self-pay | Admitting: Family Nurse Practitioner

## 2024-05-20 DIAGNOSIS — E1069 Type 1 diabetes mellitus with other specified complication: Secondary | ICD-10-CM

## 2024-05-20 MED ORDER — DEXCOM G7 SENSOR MISC
1.0000 | 4 refills | Status: AC
Start: 2024-05-20 — End: ?

## 2024-05-20 NOTE — Progress Notes (Signed)
 Pt requests fill for Dexcom G7    Last office visit: 7.9.25  Next office visit: TBD (10.29.25)   Labs: 7.29.25    Rx pended for review.

## 2024-05-21 NOTE — Telephone Encounter (Signed)
 RX sent to pharmacy on 05/20/24

## 2024-06-11 ENCOUNTER — Other Ambulatory Visit (HOSPITAL_BASED_OUTPATIENT_CLINIC_OR_DEPARTMENT_OTHER): Payer: Self-pay | Admitting: Family Nurse Practitioner

## 2024-06-11 DIAGNOSIS — Z6837 Body mass index (BMI) 37.0-37.9, adult: Secondary | ICD-10-CM

## 2024-06-11 DIAGNOSIS — E1165 Type 2 diabetes mellitus with hyperglycemia: Secondary | ICD-10-CM

## 2024-06-12 ENCOUNTER — Other Ambulatory Visit (HOSPITAL_BASED_OUTPATIENT_CLINIC_OR_DEPARTMENT_OTHER): Payer: Self-pay

## 2024-06-12 ENCOUNTER — Other Ambulatory Visit: Payer: Self-pay

## 2024-06-12 DIAGNOSIS — E1165 Type 2 diabetes mellitus with hyperglycemia: Secondary | ICD-10-CM

## 2024-06-12 MED ORDER — TIRZEPATIDE 7.5 MG/0.5ML SC SOAJ
7.5000 mg | SUBCUTANEOUS | 0 refills | Status: DC
Start: 2024-06-12 — End: 2024-07-10

## 2024-06-12 NOTE — Progress Notes (Signed)
 The prescription has been received by the Pharmacy Care Team. A benefit investigation is currently in process.    To follow up on the outcome of the benefit investigation, please check Episodes, Referrals or specialty pharmacy encounter section in the Patient chart review

## 2024-06-12 NOTE — Progress Notes (Signed)
 Benefits Investigation Summary  Medication: Tirzepatide  (MOUNJARO )     Prescription: Renewal  Benefits Outcome: Paid     Estimated Rx Copay:  30.00     Additional Notes: N/A  Next Step: Prescription has been released to [id number] - [place], [place] - [address] AT [facility] [address]    :

## 2024-06-12 NOTE — Progress Notes (Signed)
 Outreach Summary  Medication: Mounjaro   Outcome: Declined Warehouse Manager, Rx released to [id number] - [place], [place] - [address] AT [facility] [address] [facility]  Additional Notes: N/A

## 2024-06-15 ENCOUNTER — Encounter (HOSPITAL_BASED_OUTPATIENT_CLINIC_OR_DEPARTMENT_OTHER): Payer: Self-pay

## 2024-06-15 DIAGNOSIS — E1069 Type 1 diabetes mellitus with other specified complication: Secondary | ICD-10-CM

## 2024-06-23 ENCOUNTER — Encounter (HOSPITAL_BASED_OUTPATIENT_CLINIC_OR_DEPARTMENT_OTHER): Payer: Self-pay | Admitting: Family Nurse Practitioner

## 2024-06-23 ENCOUNTER — Telehealth (HOSPITAL_BASED_OUTPATIENT_CLINIC_OR_DEPARTMENT_OTHER): Admitting: Family Nurse Practitioner

## 2024-06-23 VITALS — Ht 77.0 in | Wt 313.0 lb

## 2024-06-23 DIAGNOSIS — Z713 Dietary counseling and surveillance: Secondary | ICD-10-CM

## 2024-06-23 DIAGNOSIS — E1165 Type 2 diabetes mellitus with hyperglycemia: Secondary | ICD-10-CM

## 2024-06-23 DIAGNOSIS — E66812 Obesity, class 2: Secondary | ICD-10-CM

## 2024-06-23 DIAGNOSIS — Z978 Presence of other specified devices: Secondary | ICD-10-CM

## 2024-06-23 DIAGNOSIS — E781 Pure hyperglyceridemia: Secondary | ICD-10-CM

## 2024-06-23 DIAGNOSIS — Z6837 Body mass index (BMI) 37.0-37.9, adult: Secondary | ICD-10-CM

## 2024-06-23 MED ORDER — TRESIBA FLEXTOUCH 200 UNIT/ML SC SOPN
30.0000 [IU] | PEN_INJECTOR | Freq: Every morning | SUBCUTANEOUS | 3 refills | Status: AC
Start: 2024-06-23 — End: ?

## 2024-06-23 MED ORDER — NOVOLOG FLEXPEN 100 UNIT/ML SC SOPN
10.0000 [IU] | PEN_INJECTOR | Freq: Three times a day (TID) | SUBCUTANEOUS | 3 refills | Status: AC
Start: 2024-06-23 — End: ?

## 2024-06-23 NOTE — Progress Notes (Signed)
 Date Time: @IPTODAYDATE @ 2:19 PM  Patient Name: [name]  Attending Physician: No att. providers found    Date: 06/23/2024 2:19 PM   Patient ID: [name] is a 23 y.o. male.  Chief Complaint:     Chief Complaint   Patient presents with    Diabetes     Follow-Up  A1c: 8.4% (7.29.25)        History of Present Illness:   [name] is a 23 y.o. male with a There is no height or weight on file to calculate BMI. and a history of diabetes Mellitus  who presents for  management of diabetes mellitus   Visit type: FOLLOW UP   Ref:      Diabetes History   Type: Type II and insulin  requiring  Current symptoms: none  Complications: without complications  Control: significantly elevated at 14.7%  Lab Results   Component Value Date    HGBA1C 8.4 (H) 03/17/2024   Comorbid illness: obesity    Pertinent History/Interval:      Last follow-up: 03/17/2024   AT LAST VISIT:   PLAN==>> Take Tresiba  40 units every morning   STOP Tresiba   10 units at bedtime   Increase to  Mounjaro  5 mg weekly   Cut down to Novolog  15 units w/ meals    TODAY   Presents for f/u   We stopped night time Tresiba  at last visit + Increased GLP-1  Continues on the rest above     He sometimes he has lows at night and at work.  He reports he is mostly in range     Diabetes Medication Regimen:  Daily basal insulin  dosing: Lantus  25 units BID  Daily bolus insulin  dosing: Novolog  25 units w/ meals     Previous Medications:   Metformin  (started insulin )     Protection Meds:   ACE-I/ARB: No   Statin:   No   No Myalgias,  Muscle aches, body aches. No hx abnormal liver test related to statin therapy     Self-Monitoring Blood Glucose:  Uses CGM: Yes [x]   No []   OR Checks Blood Sugars: Yes []   No []   Home Glucose Readings:   SE CGM  Hypoglycemia: Yes []   No []     24 hour Meals Recall:  Diet: poor compliance with American Dietetic Association (ADA) diet  Breakfast : banana or skip   Lunch :  Sandwich  Dinner :  Salads, Rice, Steak, Pasta   Snack :   not much   Beverages: Water  Gatorade   Exercise: none    Diabetes Complications:  Ophthalmology Diabetic Screening: last retinal exam was >1 year ago  Ophthalmology: no retinopathy, no glaucoma, no cataracts,  Podiatric Diabetic Screening: last podiatry exam was >1 year ago  Podiatry/Neuropathy denies LE burning, tingling, or loss of sensation  Do you Have Podiatry: Yes []   No []      Nephropathy  Yes []   No []    Renal: Yes []   No []  No  PROBLEM LIST   Problem List[1]  CURRENT MEDICATIONS   Medications Taking[2]   ALLERGIES   Allergies[3]  PAST MEDICAL HISTORY   Medical History[4]  PAST SURGICAL HISTORY   Past Surgical History[5]  FAMILY HISTORY   Family History[6]  SOCIAL HISTORY   Social History[7]     ROS     ROS: A complete 12 point ROS was obtained. Pertinent positives and negatives as noted in HPI. All other systems are negative.  General/Constitutional:  Denies Chills. Denies Fatigue. Denies Fever.   Ophthalmologic: Denies Blurred vision. Denies Eye Pain.   ENT: Denies Nasal Discharge. Denies Ear pain. Denies Sinus pain.   Endocrine: Denies Polydipsia. Denies Polyuria.   Respiratory: Denies Cough. Denies Orthopnea. Denies Shortness of breath. Denies Wheezing.   Cardiovascular: Denies Chest pain. Denies Chest pain with exertion. Denies Leg Claudication. Denies Palpitations. Denies Swelling in hands/feet.   Gastrointestinal: Denies Abdominal pain. Denies Blood in stool. Denies Constipation. Denies Diarrhea.Denies Heartburn. Denies Nausea. Denies Vomiting.   Genitourinary: Denies Blood in urine. Denies Frequent urination. Denies Painful urination.   Musculoskeletal: Denies Leg cramps. Denies Muscle aches.   Skin: Denies Skin lesion(s).   Neurologic: Denies Dizziness. Denies Gait abnormality. Denies Headache. Denies Tingling/Numbness.     Objective/Physical Exam      TELE-MEDICINE APPOINTMENT; PATIENT HAS NO S/S of DISTRESS ON CAMERA    GENERAL: alert, in no acute distress, well developed, well  nourished,oriented to time, place, and person.   HEENT: Normocephalic EYES: No injection. Visual acuity is grossly normal. EARS: external ears normal. NOSE: external nose normal without rhinorrhea THROAT: normal appearance, clear.   OROPHARYNX: moist mucus membranes, no tonsillar hypertrophy/exudate, uvula midline and pharynx non-erythematous, lips, teeth and gums are without obvious lesion.   NECK/THYROID: Full ROM, No cervical lymphadenopathy, No thyromegaly.    LUNGS: normal effort/no distress, breathing non-labored and no grunting/flaring/retractions  HEART: equal chest rise with normal respiratory effort  EXTREMITIES: no clubbing, cyanosis, or edema B/L.   SKIN: Warm, well perfused. No rashes; Acanthosis on posterior neck and bilateral axillae  PSYCH: Alert, appropriately interactive for age.  PODIATRIC:  Last foot exam ; Never had   LABS     Lab Results   Component Value Date    WBC 7.9 03/17/2024    HGB 16.7 03/17/2024    HCT 50.6 (H) 03/17/2024    RBC 5.96 (H) 03/17/2024    PLT 227 03/17/2024    MCV 84.9 03/17/2024    MCH 28.0 03/17/2024    MCHC 33.0 03/17/2024    RDW 12.9 03/17/2024    MPV 10.4 03/17/2024     Lab Results   Component Value Date    NA 140 03/17/2024    K 4.2 03/17/2024    CL 103 03/17/2024    CO2 27 03/17/2024    GLU 124 (H) 03/17/2024    BUN 10 03/17/2024    CREAT 1.29 (H) 03/17/2024    CA 9.6 03/17/2024    ANIONGAP 9.0 09/04/2022    EGFR 81 03/17/2024    AST 42 (H) 03/17/2024    ALT 73 (H) 03/17/2024    ALKPHOS 52 03/17/2024    ALB 4.5 03/17/2024    PROT 7.2 03/17/2024    GLOB 2.7 03/17/2024    AGRATIO 1.7 03/17/2024    BILITOTAL 0.4 03/17/2024     Lab Results   Component Value Date    CHOL 143 03/17/2024    TRIG 161 (H) 03/17/2024    HDL 40 03/17/2024    LDL 77 03/17/2024    CHOLHDLRATIO 3.6 03/17/2024     No results found for: TSH, T3, T4, T4FREE, T3TOTAL, TPOAB, TSIG, TRAB, TGAB  No results found for: TESTOSTERFRE, TESTOSTERONE, DHEA, PROLACTIN, FSH, LH,  ESTRADIOL)  No results found for: DHEA, PROLACTIN, FSH, LH, ESTRADIOL  No results found for: VITD, B12, FOLATE, IRON, TIBC, FERRITINS  No results found for: VITD, 25HYDROXYDTO  No results found for: PTT  Lab Results   Component Value Date  ALT 73 (H) 03/17/2024    AST 42 (H) 03/17/2024    ALKPHOS 52 03/17/2024    BILITOTAL 0.4 03/17/2024     Lab Results   Component Value Date    URMICROALB 14.0 09/04/2022    URMICROALB 6 09/04/2022     Lab Results   Component Value Date    MICROALB 0.7 03/17/2024      Lab Results   Component Value Date    HGBA1C 8.4 (H) 03/17/2024      TRENDS     Lab Results   Component Value Date    HGBA1C 8.4 (H) 03/17/2024    HGBA1C 14.7 (H) 07/01/2023    HGBA1C 8.5 (A) 09/04/2022     Lab Results   Component Value Date    CHOL 143 03/17/2024     Lab Results   Component Value Date    HDL 40 03/17/2024     Lab Results   Component Value Date    LDL 77 03/17/2024     Lab Results   Component Value Date    TRIG 161 (H) 03/17/2024     Lab Results   Component Value Date    LDL 77 03/17/2024     In Office/POCT Labs      Results       None          RAD's   No results found.  CGM/PUMP/DATA/MEDIA               Vitals   There were no vitals taken for this visit.         BP Readings from Last 3 Encounters:   07/01/23 115/72   09/04/22 136/68   07/19/22 129/73      No data found.     Wt Readings from Last 4 Encounters:   03/17/24 1342 (!) 147.4 kg (325 lb)   02/07/24 0907 136.1 kg (300 lb)   07/01/23 1116 (!) 141.9 kg (312 lb 12.8 oz)   09/05/22 1317 131.5 kg (290 lb)      Assessment/Plan:   [name] is a 23 y.o. male with a There is no height or weight on file to calculate BMI. and a history of diabetes Mellitus  who presents for  management of diabetes mellitus   Visit type: FOLLOW   Ref:     Latest Reference Range & Units 07/01/23 00:00 03/17/24 03:30   C-Peptide 0.80 - 3.85 ng/mL 4.6 (H) 5.02 (H)   GAD-65 Antibody U/mL <5.0    GAD65 Antibody Assay <5 IU/mL  <5    Hemoglobin A1C <5.7 % 14.7 (H) 8.4 (H)       Results       None           23 year old male with longstanding history of type 2 diabetes mellitus w/ extremely high A1c's   His basal bolus regimen was adjusted.  And he was started on GLP-1    Since that time blood sugars have improved.  CGM shows a GMI of 6.3%    Plan:      # Type 2 Diabetes Mellitus   Continuous Glucose Sensor:  DexCom G6 CGM: Cont  Cut down Tresiba  30 units every morning   Cut down to Novolog  10 units w/ meals  Cont. Mounjaro  7.5 mg weekly   Advised to check sugars 4 times daily and return with blood sugar log/meter; if not on CGM   Discussed risk of hypoglycemia and prevention  Counseling  was provided today on etiology of DM, the effects of hyperglycemia, and the need for A1c reduction to prevent worsening diabetes and it complications     Patient is checking blood sugar 5x/day   Patient is on multiple daily dose injections of insulin , injecting insulin  5x/day  Patient is making insulin  dosing adjustments multiple times a day based on blood sugars & dosing scale provided by HCP  Patient has multiple hospitalizations for 2 admissions for significant hypoglycemia in   CGM is needed to adjust insulin  and make more informed dosing decisions    # Obesity      07/01/2023 02/07/2024 03/17/2024   Weight Monitoring   Height 195.6 cm 195.6 cm 195.6 cm   Weight 141.885 kg 136.079 kg 147.419 kg   BMI (calculated) 37.1 kg/m2 35.6 kg/m2 38.5 kg/m2      He does have obesity putting him at risk for numerous metabolic, physical and psychosocial complications   Such as other cardiovascular risk factors, worsening DM, and kidney disease  He is interested on GLP-1's   He denies a personal or family history of MENS type 2 or MTC.   Continue to eliminate fast food, sodas or sugar sweetened beverages, reduce carbohydrates, refined sugars, processed foods, increase protein (emphasis on plant based proteins if possible), reduce saturated fats and red meats  Increase to   Mounjaro  5 mg weekly      # Microalbuminuria  Ratio & Random  Lab Results   Component Value Date    URMICROALB 14.0 09/04/2022    URMICROALB 6 09/04/2022     Lab Results   Component Value Date    GLU 124 (H) 03/17/2024    BUN 10 03/17/2024    CREAT 1.29 (H) 03/17/2024    EGFR 81 03/17/2024     Microalbumin testing is due.  Urine microalbumin/Cr ratio ordered.   Recommend optimizing therapeutic lifestyle changes which include obtaining at least 150 minutes of aerobic exercise per week and eating a heart healthy diet ( i.e. DASH diet - www.heart.org or popsteam.is ).    There were no vitals taken for this visit.  BP Readings from Last 3 Encounters:   07/01/23 115/72   09/04/22 136/68   07/19/22 129/73     Follow-up:   Return in about 3 months (around 09/23/2024).  There are no diagnoses linked to this encounter.     This visit is being conducted via video and telephone.   Verbal consent has been obtained from the patient to conduct a video and telephone: yes   Time spent in discussion: 40 minutes      *A total of 40 minutes includes previsit review of patient's chart and medical records, visit discussion, patient education and counseling, post visit documentation of assessment and plan, placement of lab orders, imaging, referrals, prescriptions and coordination of care. >50% were in was spent discussing and counseling the patient on the various evaluative and therapeutic options, as discussed above.      *This note was generated by the Epic EMR system/ Dragon speech recognition and may contain inherent errors or omissions not intended by the user. Grammatical errors, random word insertions, deletions, pronoun errors and incomplete sentences are occasional consequences of this technology due to software limitations. Not all errors are caught or corrected. If there are questions or concerns about the content of this note or information contained within the body of this dictation they should be addressed directly  with the author for clarification.*          [  1]   Patient Active Problem List  Diagnosis    Acute intractable headache, unspecified headache type   [2]   Outpatient Medications Marked as Taking for the 06/23/24 encounter (Telemedicine Visit) with Aida Muskrat, FNP   Medication Sig Dispense Refill    Dexcom G7 continuous blood glucose sensor (DEXCOM G7) 1 each by Device route every 10 (ten) days Use as directed per manufacturer instructions to monitor blood glucose daily. Change every 10 days. For Type 1 Diabetes Mellitus (ICD-10 E10.9) 9 each 4    glucose blood test strip Use to monitor blood glucose 6x daily, Accu Chek. 600 strip 3    Insulin  Degludec (Tresiba  FlexTouch) 200 UNIT/ML Solution Pen-injector Inject 30 Units into the skin once every morning AND 10 Units once at bedtime. 6 mL 11    Insulin  Pen Needle (BD Pen Needle Nano U/F) 32G X 4 MM Misc Use as directed daily 400 each 1    Insulin  Pen Needle (Embecta Pen Needle Nano) 32G X 4 MM Misc Use 5 times daily 500 each 3    NovoLOG  FlexPen 100 UNIT/ML injection pen Inject 15 Units into the skin 3 (three) times daily before meals 45 mL 3    tirzepatide  (MOUNJARO ) 7.5 MG/0.5ML injection Inject 7.5 mg into the skin once a week 2 mL 0   [3] No Known Allergies  [4]   Past Medical History:  Diagnosis Date    DM (diabetes mellitus) (CMS/HCC)     H/O blood clots 2022    Type 1 diabetes mellitus (CMS/HCC)     Type 2 diabetes mellitus (CMS/HCC)    [5]   Past Surgical History:  Procedure Laterality Date    ENDOSCOPIC SINUS SURGERY (FESS) Right 07/11/2022    Procedure: ENDOSCOPIC SINUS SURGERY (FESS) Right subtotal ethmoidectomy with frontal sinusotomy, Right sphenoidectomy with removal of content, right inferior turbinate outfracture and reduction, Right nasopharyngeal biopsy;  Surgeon: Romayne Barren, MD;  Location:  MAIN OR;  Service: ENT;  Laterality: Right;   [6]   Family History  Problem Relation Name Age of Onset    Diabetes Maternal Grandfather       Colon cancer Neg Hx      Prostate cancer Neg Hx      Heart attack Neg Hx      Heart disease Neg Hx      Stroke Neg Hx     [7]   Social History  Tobacco Use    Smoking status: Never     Passive exposure: Never    Smokeless tobacco: Never   Vaping Use    Vaping status: Never Used   Substance Use Topics    Alcohol use: Yes     Alcohol/week: 0.0 - 1.0 standard drinks of alcohol    Drug use: Never

## 2024-06-23 NOTE — Patient Instructions (Addendum)
 Hello     You saw Charles Durand, NP on 06/23/2024 for management of diabetes mellitus   The following issues were addressed:  AIC Goals .   Plan Going Forward     Continuous Glucose Sensor:  DexCom G7 CGM: Cont  Cut down Tresiba  30 units every morning   Cut down to Novolog  10 units w/ meals  Cont. Mounjaro  7.5 mg weekly   If you are on a CGM; Continue close monitoring   Test and verify with a fingerstick all low blood sugars < 60 mg/dl or highs >649 mg seen on CGM   Contact Endocrinology for blood sugars < 70 mg/dl and persistent high's of > 300 mg/dl   Dietary and Lifestyle modification:  Exercises & Food Intake  Limit intake of refined carbohydrates; such bread, rice, and pasta, refined sugars and sugar sweetened beverages   Labs are due:    A1c, Lipid Panel,       GLP-1 Receptor Agonist Medications       Wegovy , Saxenda, Zepbound (FDA approved for weight loss)  Mounjaro , Ozempic , Trulicity, Victoza (FDA approved for type 2 diabetes)    These are injectable medications that help to control blood sugar, insulin  levels, and digestion. They help to decrease appetite by telling the brain there is food in the gut and telling the stomach it is full.     Most common side effects:  Nausea (especially in the beginning or for the first 1-2 days after injection)  Vomiting  Stomach pain  Constipation/ diarrhea   Headache/ dizziness  Fatigue   Low blood sugar       Nutrition Guidelines to Minimize Side Effects       Eat smaller, more frequent meals  You will tolerate smaller, more frequent meals more than larger meals. Try not to skip meals, as this can lead to slow blood sugar, nausea, and/or fatigue.     Eat slowly   Take your time when eating and stop once you get the feeling of satisfaction. Do not eat past the feeling of fullness to avoid nausea.     Avoid fried and greasy foods  High fat foods (pizza, fatty meats, mac and cheese, fried foods) can cause nausea because they will sit in the stomach longer and take more time  to digest. Choose leaner proteins when possible and opt for the air-fryer instead of a deep fryer.     Prioritize protein at meals/snacks  Protein is important for maintaining muscle and energy levels. Build your meals and snacks around a source of lean protein (poultry, fish, pork tenderloin, eggs, low-fat dairy, beans, chickpeas, tofu, etc). You may need to use protein shakes and/or protein bars to help you reach your protein goal.     Avoid eating too close to bedtime   Stop eating 2-3 hours prior to sleep to prevent reflux from occurring.    Make sure to drink plenty of sugar-free clear fluids  Try to keep a water  bottle with you when possible and aim for 64 ounces or 2 liters per day or more. You may want to add in some sugar-free electrolyte drinks such as Gatorade Zero, Powerade Zero, or Vitamin Water  Zero.

## 2024-07-06 ENCOUNTER — Encounter (HOSPITAL_BASED_OUTPATIENT_CLINIC_OR_DEPARTMENT_OTHER): Payer: Self-pay | Admitting: Family Nurse Practitioner

## 2024-07-06 DIAGNOSIS — E1165 Type 2 diabetes mellitus with hyperglycemia: Secondary | ICD-10-CM

## 2024-07-10 MED ORDER — TIRZEPATIDE 10 MG/0.5ML SC SOAJ: 10.0000 mg | SUBCUTANEOUS | 11 refills | Status: DC

## 2024-07-14 ENCOUNTER — Telehealth (HOSPITAL_BASED_OUTPATIENT_CLINIC_OR_DEPARTMENT_OTHER): Payer: Self-pay

## 2024-07-14 NOTE — Telephone Encounter (Signed)
 PA initiated: [id number]    Drug: Dexcom G7 sensor  Status: pending     Pt notified by message.

## 2024-09-13 ENCOUNTER — Encounter (HOSPITAL_BASED_OUTPATIENT_CLINIC_OR_DEPARTMENT_OTHER): Payer: Self-pay

## 2024-09-13 DIAGNOSIS — E1069 Type 1 diabetes mellitus with other specified complication: Secondary | ICD-10-CM

## 2024-09-17 ENCOUNTER — Encounter (HOSPITAL_BASED_OUTPATIENT_CLINIC_OR_DEPARTMENT_OTHER): Payer: Self-pay | Admitting: Family Nurse Practitioner

## 2024-09-17 DIAGNOSIS — Z713 Dietary counseling and surveillance: Secondary | ICD-10-CM

## 2024-09-17 DIAGNOSIS — Z6837 Body mass index (BMI) 37.0-37.9, adult: Secondary | ICD-10-CM

## 2024-09-17 DIAGNOSIS — E1165 Type 2 diabetes mellitus with hyperglycemia: Secondary | ICD-10-CM

## 2024-09-17 DIAGNOSIS — Z978 Presence of other specified devices: Secondary | ICD-10-CM

## 2024-09-17 DIAGNOSIS — E781 Pure hyperglyceridemia: Secondary | ICD-10-CM

## 2024-09-17 DIAGNOSIS — Z79899 Other long term (current) drug therapy: Secondary | ICD-10-CM

## 2024-09-21 MED ORDER — TIRZEPATIDE-WEIGHT MANAGEMENT 10 MG/0.5ML SC SOAJ: 10.0000 mg | SUBCUTANEOUS | 0 refills | Status: AC

## 2024-09-22 ENCOUNTER — Other Ambulatory Visit: Payer: Self-pay

## 2024-09-22 NOTE — Progress Notes (Signed)
 The prescription has been received by the Pharmacy Care Team. A benefit investigation is currently in process.    To follow up on the outcome of the benefit investigation, please check Episodes, Referrals or specialty pharmacy encounter section in the Patient chart review

## 2024-09-22 NOTE — Progress Notes (Signed)
 Benefits Investigation Summary  Medication: Tirzepatide -Weight Management (ZEPBOUND ) (10mg /0.4ml)     Prescription: New Therapy  Benefits Outcome: Other (Not Covered/Plan Exclusion)     Estimated Rx Copay:  N/A     Additional Notes: N/A  Next Step: Prescription has been released to [id number] - [place], [place] - [id number] AT [facility] OF [address]    :
